# Patient Record
Sex: Male | Born: 1960 | Race: White | Hispanic: No | Marital: Married | Smoking: Current every day smoker
Health system: Southern US, Community
[De-identification: ages and names within clinical notes are randomized; demographics above are authoritative.]

## PROBLEM LIST (undated history)

## (undated) DIAGNOSIS — E785 Hyperlipidemia, unspecified: Secondary | ICD-10-CM

## (undated) DIAGNOSIS — M1990 Unspecified osteoarthritis, unspecified site: Secondary | ICD-10-CM

## (undated) DIAGNOSIS — I639 Cerebral infarction, unspecified: Secondary | ICD-10-CM

## (undated) DIAGNOSIS — I739 Peripheral vascular disease, unspecified: Secondary | ICD-10-CM

## (undated) HISTORY — DX: Peripheral vascular disease, unspecified: I73.9

## (undated) HISTORY — DX: Unspecified osteoarthritis, unspecified site: M19.90

## (undated) HISTORY — DX: Cerebral infarction, unspecified: I63.9

## (undated) HISTORY — DX: Hyperlipidemia, unspecified: E78.5

---

## 2011-09-28 ENCOUNTER — Emergency Department (HOSPITAL_COMMUNITY)
Admission: EM | Admit: 2011-09-28 | Discharge: 2011-09-28 | Disposition: A | Discharge status: Home / Self Care | Payer: Self-pay | Attending: Emergency Medicine | Admitting: Emergency Medicine

## 2011-09-28 ENCOUNTER — Emergency Department (HOSPITAL_COMMUNITY): Payer: Self-pay

## 2011-09-28 ENCOUNTER — Encounter: Payer: Self-pay | Admitting: Emergency Medicine

## 2011-09-28 DIAGNOSIS — M542 Cervicalgia: Secondary | ICD-10-CM | POA: Insufficient documentation

## 2011-09-28 DIAGNOSIS — T07XXXA Unspecified multiple injuries, initial encounter: Secondary | ICD-10-CM

## 2011-09-28 DIAGNOSIS — R404 Transient alteration of awareness: Secondary | ICD-10-CM | POA: Insufficient documentation

## 2011-09-28 DIAGNOSIS — S0990XA Unspecified injury of head, initial encounter: Secondary | ICD-10-CM | POA: Insufficient documentation

## 2011-09-28 DIAGNOSIS — Z7289 Other problems related to lifestyle: Secondary | ICD-10-CM

## 2011-09-28 DIAGNOSIS — T148XXA Other injury of unspecified body region, initial encounter: Secondary | ICD-10-CM | POA: Insufficient documentation

## 2011-09-28 DIAGNOSIS — F1010 Alcohol abuse, uncomplicated: Secondary | ICD-10-CM | POA: Insufficient documentation

## 2011-09-28 DIAGNOSIS — F17200 Nicotine dependence, unspecified, uncomplicated: Secondary | ICD-10-CM | POA: Insufficient documentation

## 2011-09-28 DIAGNOSIS — R51 Headache: Secondary | ICD-10-CM | POA: Insufficient documentation

## 2011-09-28 LAB — ETHANOL: Alcohol, Ethyl (B): 306 mg/dL — ABNORMAL HIGH (ref 0–11)

## 2011-09-28 LAB — POCT I-STAT, CHEM 8
BUN: 9 mg/dL (ref 6–23)
Chloride: 107 mEq/L (ref 96–112)
Sodium: 144 mEq/L (ref 135–145)
TCO2: 23 mmol/L (ref 0–100)

## 2011-09-28 MED ORDER — ACETAMINOPHEN 325 MG PO TABS: 650.0000 mg | ORAL_TABLET | Freq: Once | ORAL | Status: DC

## 2011-09-28 MED ORDER — ACETAMINOPHEN 325 MG PO TABS
ORAL_TABLET | ORAL | Status: AC
  Administered 2011-09-28: 650 mg
  Filled 2011-09-28: qty 2
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
## 2011-09-28 NOTE — ED Notes (Signed)
MD at bedside. 

## 2011-09-28 NOTE — ED Notes (Signed)
PT was ambulated and successfully walked w/o difficulties. States his heart was beating a little fast but stopped half-way through the process

## 2011-09-28 NOTE — ED Notes (Signed)
Patient cleared off the backboard per dr.Beaton order, ccollar in place

## 2011-09-28 NOTE — ED Provider Notes (Addendum)
7:24 AM I received care of the patient from Dr. Radford Pax after he has been medically evaluated and found to be intoxicated with an ethanol level of 308. At the time of sign out as instructed to await sobriety for the patient and at that time he would be able to be discharged home. I will reassess the patient in a matter of hours to evaluate his sobriety been. At this time he is sleeping comfortably in bed in no apparent distress, easily arousable to voice but intoxicated.  Felisa Bonier, MD 09/28/11 [phone]  12:14 PM The patient ambulated steadily and independently in the hallway of the emergency department demonstrating a steady gait. She is this time awake, alert, and oriented appropriately to person, place, time, and event, and is clinically sober and will be discharged home.  Felisa Bonier, MD 09/28/11 [phone]

## 2013-02-02 ENCOUNTER — Emergency Department (HOSPITAL_BASED_OUTPATIENT_CLINIC_OR_DEPARTMENT_OTHER)
Admission: EM | Admit: 2013-02-02 | Discharge: 2013-02-02 | Disposition: A | Discharge status: Home / Self Care | Payer: BC Managed Care – PPO | Attending: Emergency Medicine | Admitting: Emergency Medicine

## 2013-02-02 ENCOUNTER — Encounter (HOSPITAL_BASED_OUTPATIENT_CLINIC_OR_DEPARTMENT_OTHER): Payer: Self-pay | Admitting: *Deleted

## 2013-02-02 DIAGNOSIS — F17200 Nicotine dependence, unspecified, uncomplicated: Secondary | ICD-10-CM | POA: Insufficient documentation

## 2013-02-02 DIAGNOSIS — Y9389 Activity, other specified: Secondary | ICD-10-CM | POA: Insufficient documentation

## 2013-02-02 DIAGNOSIS — W540XXA Bitten by dog, initial encounter: Secondary | ICD-10-CM | POA: Insufficient documentation

## 2013-02-02 DIAGNOSIS — Z23 Encounter for immunization: Secondary | ICD-10-CM | POA: Insufficient documentation

## 2013-02-02 DIAGNOSIS — Y92009 Unspecified place in unspecified non-institutional (private) residence as the place of occurrence of the external cause: Secondary | ICD-10-CM | POA: Insufficient documentation

## 2013-02-02 DIAGNOSIS — S61452A Open bite of left hand, initial encounter: Secondary | ICD-10-CM

## 2013-02-02 DIAGNOSIS — S61209A Unspecified open wound of unspecified finger without damage to nail, initial encounter: Secondary | ICD-10-CM | POA: Insufficient documentation

## 2013-02-02 MED ORDER — OXYCODONE-ACETAMINOPHEN 5-325 MG PO TABS
2.0000 | ORAL_TABLET | Freq: Once | ORAL | Status: AC
  Administered 2013-02-02: 2 via ORAL
  Filled 2013-02-02 (×2): qty 2

## 2013-02-02 MED ORDER — TETANUS-DIPHTH-ACELL PERTUSSIS 5-2.5-18.5 LF-MCG/0.5 IM SUSP
0.5000 mL | Freq: Once | INTRAMUSCULAR | Status: AC
  Administered 2013-02-02: 0.5 mL via INTRAMUSCULAR
  Filled 2013-02-02: qty 0.5

## 2013-02-02 MED ORDER — CLINDAMYCIN HCL 300 MG PO CAPS: 300.0000 mg | ORAL_CAPSULE | Freq: Four times a day (QID) | ORAL | Status: DC

## 2013-02-02 MED ORDER — CLINDAMYCIN HCL 150 MG PO CAPS
300.0000 mg | ORAL_CAPSULE | Freq: Once | ORAL | Status: AC
  Administered 2013-02-02: 300 mg via ORAL
  Filled 2013-02-02: qty 2

## 2013-02-02 MED ORDER — OXYCODONE-ACETAMINOPHEN 5-325 MG PO TABS: 2.0000 | ORAL_TABLET | ORAL | Status: DC | PRN
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
## 2013-02-02 NOTE — ED Notes (Signed)
Pt c/o dog bite to left hand x 1 hr ago

## 2013-02-02 NOTE — ED Provider Notes (Signed)
Medical screening examination/treatment/procedure(s) were performed by non-physician practitioner and as supervising physician I was immediately available for consultation/collaboration.  Ethelda Chick, MD 02/02/13 2140

## 2016-12-01 ENCOUNTER — Emergency Department (HOSPITAL_COMMUNITY): Payer: BLUE CROSS/BLUE SHIELD

## 2016-12-01 ENCOUNTER — Inpatient Hospital Stay (HOSPITAL_COMMUNITY)
Admission: EM | Admit: 2016-12-01 | Discharge: 2016-12-04 | DRG: 062 | Disposition: A | Discharge status: Home / Self Care | Payer: BLUE CROSS/BLUE SHIELD | Attending: Neurology | Admitting: Neurology

## 2016-12-01 ENCOUNTER — Encounter (HOSPITAL_COMMUNITY): Payer: Self-pay | Admitting: Emergency Medicine

## 2016-12-01 DIAGNOSIS — H5500 Unspecified nystagmus: Secondary | ICD-10-CM | POA: Diagnosis present

## 2016-12-01 DIAGNOSIS — F17210 Nicotine dependence, cigarettes, uncomplicated: Secondary | ICD-10-CM | POA: Diagnosis present

## 2016-12-01 DIAGNOSIS — F1490 Cocaine use, unspecified, uncomplicated: Secondary | ICD-10-CM | POA: Diagnosis present

## 2016-12-01 DIAGNOSIS — F17200 Nicotine dependence, unspecified, uncomplicated: Secondary | ICD-10-CM | POA: Diagnosis present

## 2016-12-01 DIAGNOSIS — Q318 Other congenital malformations of larynx: Secondary | ICD-10-CM

## 2016-12-01 DIAGNOSIS — I63211 Cerebral infarction due to unspecified occlusion or stenosis of right vertebral arteries: Secondary | ICD-10-CM | POA: Diagnosis present

## 2016-12-01 DIAGNOSIS — R1310 Dysphagia, unspecified: Secondary | ICD-10-CM | POA: Diagnosis present

## 2016-12-01 DIAGNOSIS — Z23 Encounter for immunization: Secondary | ICD-10-CM | POA: Diagnosis not present

## 2016-12-01 DIAGNOSIS — E785 Hyperlipidemia, unspecified: Secondary | ICD-10-CM | POA: Diagnosis present

## 2016-12-01 DIAGNOSIS — I6350 Cerebral infarction due to unspecified occlusion or stenosis of unspecified cerebral artery: Secondary | ICD-10-CM

## 2016-12-01 DIAGNOSIS — R531 Weakness: Secondary | ICD-10-CM

## 2016-12-01 DIAGNOSIS — F1410 Cocaine abuse, uncomplicated: Secondary | ICD-10-CM | POA: Diagnosis not present

## 2016-12-01 DIAGNOSIS — R29810 Facial weakness: Secondary | ICD-10-CM | POA: Diagnosis present

## 2016-12-01 DIAGNOSIS — G8194 Hemiplegia, unspecified affecting left nondominant side: Secondary | ICD-10-CM | POA: Diagnosis present

## 2016-12-01 DIAGNOSIS — F1010 Alcohol abuse, uncomplicated: Secondary | ICD-10-CM | POA: Diagnosis not present

## 2016-12-01 DIAGNOSIS — I639 Cerebral infarction, unspecified: Secondary | ICD-10-CM

## 2016-12-01 DIAGNOSIS — I6602 Occlusion and stenosis of left middle cerebral artery: Secondary | ICD-10-CM | POA: Diagnosis present

## 2016-12-01 DIAGNOSIS — I6501 Occlusion and stenosis of right vertebral artery: Secondary | ICD-10-CM | POA: Diagnosis present

## 2016-12-01 DIAGNOSIS — R29704 NIHSS score 4: Secondary | ICD-10-CM | POA: Diagnosis present

## 2016-12-01 DIAGNOSIS — H538 Other visual disturbances: Secondary | ICD-10-CM

## 2016-12-01 DIAGNOSIS — M7989 Other specified soft tissue disorders: Secondary | ICD-10-CM | POA: Diagnosis not present

## 2016-12-01 LAB — I-STAT TROPONIN, ED: TROPONIN I, POC: 0 ng/mL (ref 0.00–0.08)

## 2016-12-01 LAB — COMPREHENSIVE METABOLIC PANEL
ALT: 27 U/L (ref 17–63)
ANION GAP: 10 (ref 5–15)
AST: 24 U/L (ref 15–41)
Albumin: 3.8 g/dL (ref 3.5–5.0)
Alkaline Phosphatase: 84 U/L (ref 38–126)
BILIRUBIN TOTAL: 0.7 mg/dL (ref 0.3–1.2)
BUN: 8 mg/dL (ref 6–20)
CO2: 26 mmol/L (ref 22–32)
Calcium: 9.5 mg/dL (ref 8.9–10.3)
Chloride: 101 mmol/L (ref 101–111)
Creatinine, Ser: 0.83 mg/dL (ref 0.61–1.24)
GFR calc Af Amer: >60 mL/min (ref >60)
Glucose, Bld: 109 mg/dL — ABNORMAL HIGH (ref 65–99)
POTASSIUM: 4.3 mmol/L (ref 3.5–5.1)
Sodium: 137 mmol/L (ref 135–145)
TOTAL PROTEIN: 7.1 g/dL (ref 6.5–8.1)

## 2016-12-01 LAB — DIFFERENTIAL
BASOS PCT: 0 %
Basophils Absolute: 0 10*3/uL (ref 0.0–0.1)
EOS PCT: 1 %
Eosinophils Absolute: 0.1 10*3/uL (ref 0.0–0.7)
Lymphocytes Relative: 21 %
Lymphs Abs: 3 10*3/uL (ref 0.7–4.0)
MONO ABS: 1.5 10*3/uL — AB (ref 0.1–1.0)
Monocytes Relative: 10 %
NEUTROS ABS: 9.9 10*3/uL — AB (ref 1.7–7.7)
Neutrophils Relative %: 68 %

## 2016-12-01 LAB — CBC
HEMATOCRIT: 45.9 % (ref 39.0–52.0)
Hemoglobin: 15.6 g/dL (ref 13.0–17.0)
MCH: 31.4 pg (ref 26.0–34.0)
MCHC: 34 g/dL (ref 30.0–36.0)
MCV: 92.4 fL (ref 78.0–100.0)
Platelets: 259 10*3/uL (ref 150–400)
RBC: 4.97 MIL/uL (ref 4.22–5.81)
RDW: 12.9 % (ref 11.5–15.5)
WBC: 14.5 10*3/uL — ABNORMAL HIGH (ref 4.0–10.5)

## 2016-12-01 LAB — APTT: APTT: 36 s (ref 24–36)

## 2016-12-01 LAB — I-STAT CHEM 8, ED
BUN: 11 mg/dL (ref 6–20)
CALCIUM ION: 1.07 mmol/L — AB (ref 1.15–1.40)
Chloride: 100 mmol/L — ABNORMAL LOW (ref 101–111)
Creatinine, Ser: 0.8 mg/dL (ref 0.61–1.24)
GLUCOSE: 110 mg/dL — AB (ref 65–99)
HCT: 48 % (ref 39.0–52.0)
Hemoglobin: 16.3 g/dL (ref 13.0–17.0)
POTASSIUM: 4.1 mmol/L (ref 3.5–5.1)
Sodium: 139 mmol/L (ref 135–145)
TCO2: 28 mmol/L (ref 0–100)

## 2016-12-01 LAB — CBG MONITORING, ED: GLUCOSE-CAPILLARY: 114 mg/dL — AB (ref 65–99)

## 2016-12-01 LAB — PROTIME-INR
INR: 0.98
Prothrombin Time: 13 seconds (ref 11.4–15.2)

## 2016-12-01 LAB — ETHANOL

## 2016-12-01 MED ORDER — STROKE: EARLY STAGES OF RECOVERY BOOK
Freq: Once | Status: AC
  Administered 2016-12-02: 04:00:00
  Filled 2016-12-01 (×2): qty 1

## 2016-12-01 MED ORDER — IOPAMIDOL (ISOVUE-370) INJECTION 76%
INTRAVENOUS | Status: AC
  Administered 2016-12-01: 50 mL via INTRAVENOUS
  Filled 2016-12-01: qty 50

## 2016-12-01 MED ORDER — ACETAMINOPHEN 650 MG RE SUPP: 650.0000 mg | RECTAL | Status: DC | PRN

## 2016-12-01 MED ORDER — SENNOSIDES-DOCUSATE SODIUM 8.6-50 MG PO TABS
1.0000 | ORAL_TABLET | Freq: Every evening | ORAL | Status: DC | PRN
  Filled 2016-12-01: qty 1

## 2016-12-01 MED ORDER — ALTEPLASE (STROKE) FULL DOSE INFUSION
0.9000 mg/kg | Freq: Once | INTRAVENOUS | Status: AC
  Administered 2016-12-01: 69 mg via INTRAVENOUS
  Filled 2016-12-01: qty 100

## 2016-12-01 MED ORDER — ACETAMINOPHEN 325 MG PO TABS
650.0000 mg | ORAL_TABLET | ORAL | Status: DC | PRN
  Administered 2016-12-02 – 2016-12-03 (×2): 650 mg via ORAL
  Filled 2016-12-01 (×2): qty 2

## 2016-12-01 MED ORDER — PANTOPRAZOLE SODIUM 40 MG IV SOLR
40.0000 mg | Freq: Every day | INTRAVENOUS | Status: DC
  Administered 2016-12-02: 40 mg via INTRAVENOUS
  Filled 2016-12-01: qty 40

## 2016-12-01 MED ORDER — LABETALOL HCL 5 MG/ML IV SOLN: 20.0000 mg | Freq: Once | INTRAVENOUS | Status: DC

## 2016-12-01 MED ORDER — SODIUM CHLORIDE 0.9 % IV SOLN
INTRAVENOUS | Status: DC
  Administered 2016-12-02 – 2016-12-03 (×3): via INTRAVENOUS

## 2016-12-01 MED ORDER — ACETAMINOPHEN 160 MG/5ML PO SOLN: 650.0000 mg | ORAL | Status: DC | PRN

## 2016-12-01 MED ORDER — CLEVIDIPINE BUTYRATE 0.5 MG/ML IV EMUL
0.0000 mg/h | INTRAVENOUS | Status: DC
  Filled 2016-12-01: qty 50

## 2016-12-01 NOTE — Consult Note (Addendum)
Admission H&P    Chief Complaint: Acute onset of headache with vertigo, left arm numbness/weakness and right facial droop  HPI: Hector Booker is an 56 y.o. male who presents with acute onset of crossed neurological deficits. Symptoms began with headache at 8:30 PM. 20 minutes later, he noticed trouble swallowing, dysarthria, left arm paresthesias with sensory numbness and left arm heaviness. Initially thought to be a possible MI, but while in ambulance he vomited and then developed diplopia with leftward deviation of left eye relative to right, in conjunction with nystagmus. Code Stroke was called.   BP 172/95, CBG 114. The patient rates his headache as 8/10. Describes vertigo as combined spinning and floating with sensation of disequilibrium. He has no prior history of vertigo. Denies history of ischemic stroke, ICH or MI.   LSN: 8:50 PM tPA Given: Yes NIHSS: 4  No past medical history on file.  No past surgical history on file.  No family history on file. Social History:  reports that he has been smoking Cigarettes.  He has been smoking about 1.00 pack per day. He does not have any smokeless tobacco history on file. He reports that he drinks about 7.2 oz of alcohol per week . He reports that he does not use drugs.  Allergies:  Allergies  Allergen Reactions  . Penicillins Itching    (Not in a hospital admission)  ROS: As per HPI.   Physical Examination: Weight 76.2 kg (167 lb 15.9 oz).  HEENT-  Normocephalic/atraumati. Neck - supple  Lungs - Respirations unlabored. No gross wheezing.  Extremities - No edema.   Neurologic Examination: Ment: Intact to complex questions and commands. No receptive or expressive aphasia. Fully oriented.  CN: PERRL. No visual field cut. Eyes drift slowly to left conjugately; no nystagmus on leftward gaze. Right fast beating nystagmus conjugately when eyes at midline and worsens with gaze to right. Nystagmus becomes oscillatory with upgaze.  Decreased temp and fine touch sensation right face. Subtle right facial droop. Hearing intact to conversation. No dysphonia. XI - symmetric. Tongue protrudes midline.  Motor: 5/5 all 4 extremities with no drift.  Sensory: Decreased FT sensation left arm. Temperature intact all 4 limbs.  Reflexes: Normoactive x 4 without asymmetry. Toes equivocal.  Cerebellar: Ataxia with left FNF. Normal on right.  Gait: Deferred due to falls risk concerns.   Results for orders placed or performed during the hospital encounter of 12/01/16 (from the past 48 hour(s))  CBG monitoring, ED     Status: Abnormal   Collection Time: 12/01/16 11:04 PM  Result Value Ref Range   Glucose-Capillary 114 (H) 65 - 99 mg/dL  I-Stat Chem 8, ED  [facility])     Status: Abnormal   Collection Time: 12/01/16 11:14 PM  Result Value Ref Range   Sodium 139 135 - 145 mmol/L   Potassium 4.1 3.5 - 5.1 mmol/L   Chloride 100 (L) 101 - 111 mmol/L   BUN 11 6 - 20 mg/dL   Creatinine, Ser 1.610.80 0.61 - 1.24 mg/dL   Glucose, Bld 096110 (H) 65 - 99 mg/dL   Calcium, Ion 0.451.07 (L) 1.15 - 1.40 mmol/L   TCO2 28 0 - 100 mmol/L   Hemoglobin 16.3 13.0 - 17.0 g/dL   HCT 40.948.0 81.139.0 - 91.452.0 %   No results found.  Assessment: 56 y.o. male with acute onset of headache with vertigo, left arm numbness/weakness/ataxia and right facial droop. 1. CT negative for hemorrhage or subacute hypodensity.  2. 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  3. After comprehensive review of possible contraindications, the patient has no absolute contraindications to tPA administration. 4. The patient is a tPA candidate. Discussed extensively the risks/benefits of tPA treatment vs. no treatment with the patient, including risks of hemorrhage and death with tPA administration versus worse overall outcomes on average in patients within tPA time window who are not administered tPA. Overall benefits of tPA regarding long-term prognosis are  felt to outweigh risks. The patient expressed understanding and wish to proceed with tPA.   Plan: 1. Following tPA infusion, admit to MICU.  2. BP management per post-tPA order set.  3. No antiplatelet medications or anticoagulants. Can consider starting if repeat CT head at 24 hours post-tPA is negative for hemorrhagic conversion. 4. Catheter based angiography in AM. May be a candidate for thrombectomy. Discussed with Dr. Titus Dubin and patient.  5. MRI of the brain without contrast 6. TTE.  7. PT consult, OT consult, Speech consult 8. Risk factor modification 9. Telemetry monitoring and frequent neuro checks.  10. HgbA1c, fasting lipid panel 11. 8. DVT prophylaxis with SCDs.   I spent 45 minutes of neurocritical care time in the evaluation and management of this patient.  Electronically signed: Dr. Caryl Pina 12/01/2016, 11:22 PM

## 2016-12-01 NOTE — ED Provider Notes (Signed)
[facility]-EMERGENCY DEPT Provider Note   CSN: [id number] Arrival date & time: 12/01/16  2258  By signing my name below, I, Arianna Nassar, attest that this documentation has been prepared under the direction and in the presence of Shon Baton, MD. Electronically Signed: Octavia Heir, ED Scribe. 12/01/16. 11:55 PM.   History   Chief Complaint Chief Complaint  Patient presents with  . Code Stroke   LEVEL V CAVEAT: HPI and ROS limited due to acuity of condition   The history is provided by the EMS personnel. The history is limited by the condition of the patient. No language interpreter was used.   HPI Comments: Hector Booker is a 56 y.o. male brought in by ambulance, who presents to the Emergency Department as a code stroke. Last seen normal was 8:50 pm. Per EMS, pt was complaining of an acute onset headache ~ 8:30 this evening. Per wife, ~ 20 minutes later, pt was very diaphoretic,and began complaining of left arm numbness, tingling, and weakness. Wife says he has been complaining of left lower extremity weakness and tingling intermittently for the past wek.  EMS states while en route, pt began to have right sided facial droop and a left eye gaze.  EMS vitals: BP: 172/95, HR 69, NSR 97% 2L/min [place], CBG 113  History reviewed. No pertinent past medical history.  Patient Active Problem List   Diagnosis Date Noted  . 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 No pertinent surgical history.     Home Medications    Prior to Admission medications   Medication Sig Start Date End Date Taking? Authorizing Provider  clindamycin (CLEOCIN) 300 MG capsule Take 1 capsule (300 mg total) by mouth 4 (four) times daily. Patient not taking: Reported on 12/01/2016 02/02/13   Elson Areas, PA-C  oxyCODONE-acetaminophen (PERCOCET/ROXICET) 5-325 MG per tablet Take 2 tablets by mouth every 4 (four) hours as needed for pain. Patient not taking: Reported on 12/01/2016 02/02/13   Elson Areas, PA-C    Family History No family history on file.  Social History Social History  Substance Use Topics  . Smoking status: Current Every Day Smoker    Packs/day: 1.00    Types: Cigarettes  . Smokeless tobacco: Never Used  . Alcohol use 7.2 oz/week    12 Cans of beer per week     Allergies   Penicillins   Review of Systems Review of Systems  Neurological: Positive for facial asymmetry, weakness, numbness and headaches.  All other systems reviewed and are negative.  LEVEL V CAVEAT: HPI and ROS limited due to acuity of condition.  Physical Exam Updated Vital Signs BP 141/91   Pulse 76   Temp 97.5 F (36.4 C)   Resp 18   Wt 167 lb 15.9 oz (76.2 kg)   SpO2 98%   BMI 25.54 kg/m   Physical Exam  Constitutional: He is oriented to person, place, and time. He appears well-developed and well-nourished. No distress.  HENT:  Head: Normocephalic and atraumatic.  Mild right-sided facial droop  Eyes: Pupils are equal, round, and reactive to light.  Cardiovascular: Normal rate, regular rhythm and normal heart sounds.   No murmur heard. Pulmonary/Chest: Effort normal and breath sounds normal. No respiratory distress. He has no wheezes.  Abdominal: Soft. Bowel sounds are normal. There is no tenderness. There is no rebound.  Musculoskeletal: He exhibits no edema.  Neurological: He is alert and oriented to person, place, and time.  No facial droop  noted on secondary assessment, 5 out of 5 strength in all 4 extremities, improving dysmetria left finger-nose-finger, fluent speech  Skin: Skin is warm and dry.  Psychiatric: He has a normal mood and affect.  Nursing note and vitals reviewed.    ED Treatments / Results  COORDINATION OF CARE:  11:48 PM Discussed treatment plan with treatment team at bedside and they agreed to plan.  Labs (all labs ordered are listed, but only abnormal results are displayed) Labs Reviewed  CBC - Abnormal; Notable for the following:        Result Value   WBC 14.5 (*)    All other components within normal limits  DIFFERENTIAL - Abnormal; Notable for the following:    Neutro Abs 9.9 (*)    Monocytes Absolute 1.5 (*)    All other components within normal limits  COMPREHENSIVE METABOLIC PANEL - Abnormal; Notable for the following:    Glucose, Bld 109 (*)    All other components within normal limits  I-STAT CHEM 8, ED - Abnormal; Notable for the following:    Chloride 100 (*)    Glucose, Bld 110 (*)    Calcium, Ion 1.07 (*)    All other components within normal limits  CBG MONITORING, ED - Abnormal; Notable for the following:    Glucose-Capillary 114 (*)    All other components within normal limits  ETHANOL  PROTIME-INR  APTT  TROPONIN I  RAPID URINE DRUG SCREEN, HOSP PERFORMED  URINALYSIS, ROUTINE W REFLEX MICROSCOPIC  HEMOGLOBIN A1C  LIPID PANEL  URINALYSIS, COMPLETE (UACMP) WITH MICROSCOPIC  I-STAT TROPOININ, ED    EKG  EKG Interpretation  Date/Time:  Tuesday December 02 2016 00:15:12 EST Ventricular Rate:  55 PR Interval:    QRS Duration: 96 QT Interval:  455 QTC Calculation: 436 R Axis:   63 Text Interpretation:  Sinus rhythm Confirmed by Wilkie AyeHORTON  MD, Noely Kuhnle ([id number]) on 12/02/2016 12:48:04 AM       Radiology Ct Angio Head W Or Wo Contrast  Addendum Date: 12/02/2016   ADDENDUM REPORT: 12/02/2016 00:11 ADDENDUM: Acute findings discussed with and reconfirmed by Dr.ERIC LINDZEN on 12/01/2016 at 12:10 am. Electronically Signed   By: Awilda Metroourtnay  Bloomer M.D.   On: 12/02/2016 00:11   Result Date: 12/02/2016 CLINICAL DATA:  Headache, blurry vision and LEFT-sided weakness beginning at 2030 hours today. EXAM: CT ANGIOGRAPHY HEAD AND NECK TECHNIQUE: Multidetector CT imaging of the head and neck was performed using the standard protocol during bolus administration of intravenous contrast. Multiplanar CT image reconstructions and MIPs were obtained to evaluate the vascular anatomy. Carotid stenosis measurements (when  applicable) are obtained utilizing NASCET criteria, using the distal internal carotid diameter as the denominator. CONTRAST:  50 cc Isovue 370 COMPARISON:  CT HEAD December 01, 2016 at 2307 hours FINDINGS: CTA NECK AORTIC ARCH: Normal appearance of the thoracic arch, normal branch pattern. Mild calcific atherosclerosis of aortic arch. The origins of the innominate, left Common carotid artery and subclavian artery are widely patent. RIGHT CAROTID SYSTEM: Common carotid artery is widely patent, coursing in a straight line fashion. Normal appearance of the carotid bifurcation without hemodynamically significant stenosis by NASCET criteria. Mild eccentric atherosclerosis internal carotid artery origin and RIGHT cervical internal carotid artery. Normal appearance of the included internal carotid artery. LEFT CAROTID SYSTEM: Common carotid artery is widely patent, coursing in a straight line fashion. Normal appearance of the carotid bifurcation without hemodynamically significant stenosis by NASCET criteria. Mild eccentric atherosclerosis. Normal appearance of the included internal carotid  artery. VERTEBRAL ARTERIES:LEFT vertebral artery is dominant. Mild luminal irregularity RIGHT vertebral artery without intimal flap or a focal filling defect. SKELETON: No acute osseous process though bone windows have not been submitted. Degenerative cervical spine resulting in moderate LEFT C2-3, moderate to severe RIGHT C3-4, bilateral severe C4-5, severe LEFT C5-6 and LEFT C6-7 neural foraminal narrowing. Scattered dental caries and molar periapical lucency/abscess. OTHER NECK: Soft tissues of the neck are non-acute though, not tailored for evaluation. Mild bronchitic changes in the included lung apices. Asymmetric fullness RIGHT focal cord. Asymmetrically atrophic LEFT parotid gland. CTA HEAD ANTERIOR CIRCULATION: Normal appearance of the cervical internal carotid arteries, petrous, cavernous and supra clinoid internal carotid  arteries. Widely patent anterior communicating artery. Normal appearance of the anterior and middle cerebral arteries. Mild luminal regularity. No large vessel occlusion, hemodynamically significant stenosis, dissection, contrast extravasation or aneurysm. POSTERIOR CIRCULATION: Moderate luminal irregularity RIGHT V4 segment, occluded RIGHT mid V4 segment. RIGHT posterior-inferior cerebellar artery is patent. LEFT vertebral artery is widely patent. Minimal retrograde flow into distal RIGHT V4 segment. Normal appearance the basilar artery and main branch vessels. Moderate luminal regularity LEFT vertebral artery and bilateral posterior cerebral arteries. No hemodynamically significant stenosis, contrast extravasation or aneurysm. VENOUS SINUSES: Major dural venous sinuses are patent though not tailored for evaluation on this angiographic examination. ANATOMIC VARIANTS: None. DELAYED PHASE: Not performed. IMPRESSION: CTA neck: Luminal irregularity RIGHT vertebral artery compatible with age indeterminate injury, without flow limiting stenosis. No hemodynamically significant stenosis of the carotid arteries. Asymmetric fullness RIGHT vocal cord, recommend direct inspection on nonemergent basis. Degenerative cervical spine resulting in multilevel severe neural foraminal narrowing. 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 Mild - moderate luminal irregularity of the intracranial vessels compatible with atherosclerosis. Dr. Otelia Limes, Neurology paged December 02, 2016 at 0001 hours, awaiting return call. Electronically Signed: By: Awilda Metro M.D. On: 12/02/2016 00:03   Ct Angio Neck W Or Wo Contrast  Addendum Date: 12/02/2016   ADDENDUM REPORT: 12/02/2016 00:11 ADDENDUM: Acute findings discussed with and reconfirmed by Dr.ERIC LINDZEN on 12/01/2016 at 12:10 am. Electronically Signed   By: Awilda Metro M.D.   On: 12/02/2016 00:11   Result Date: 12/02/2016 CLINICAL DATA:  Headache,  blurry vision and LEFT-sided weakness beginning at 2030 hours today. EXAM: CT ANGIOGRAPHY HEAD AND NECK TECHNIQUE: Multidetector CT imaging of the head and neck was performed using the standard protocol during bolus administration of intravenous contrast. Multiplanar CT image reconstructions and MIPs were obtained to evaluate the vascular anatomy. Carotid stenosis measurements (when applicable) are obtained utilizing NASCET criteria, using the distal internal carotid diameter as the denominator. CONTRAST:  50 cc Isovue 370 COMPARISON:  CT HEAD December 01, 2016 at 2307 hours FINDINGS: CTA NECK AORTIC ARCH: Normal appearance of the thoracic arch, normal branch pattern. Mild calcific atherosclerosis of aortic arch. The origins of the innominate, left Common carotid artery and subclavian artery are widely patent. RIGHT CAROTID SYSTEM: Common carotid artery is widely patent, coursing in a straight line fashion. Normal appearance of the carotid bifurcation without hemodynamically significant stenosis by NASCET criteria. Mild eccentric atherosclerosis internal carotid artery origin and RIGHT cervical internal carotid artery. Normal appearance of the included internal carotid artery. LEFT CAROTID SYSTEM: Common carotid artery is widely patent, coursing in a straight line fashion. Normal appearance of the carotid bifurcation without hemodynamically significant stenosis by NASCET criteria. Mild eccentric atherosclerosis. Normal appearance of the included internal carotid artery. VERTEBRAL ARTERIES:LEFT vertebral artery is dominant. Mild luminal irregularity RIGHT vertebral  artery without intimal flap or a focal filling defect. SKELETON: No acute osseous process though bone windows have not been submitted. Degenerative cervical spine resulting in moderate LEFT C2-3, moderate to severe RIGHT C3-4, bilateral severe C4-5, severe LEFT C5-6 and LEFT C6-7 neural foraminal narrowing. Scattered dental caries and molar periapical  lucency/abscess. OTHER NECK: Soft tissues of the neck are non-acute though, not tailored for evaluation. Mild bronchitic changes in the included lung apices. Asymmetric fullness RIGHT focal cord. Asymmetrically atrophic LEFT parotid gland. CTA HEAD ANTERIOR CIRCULATION: Normal appearance of the cervical internal carotid arteries, petrous, cavernous and supra clinoid internal carotid arteries. Widely patent anterior communicating artery. Normal appearance of the anterior and middle cerebral arteries. Mild luminal regularity. No large vessel occlusion, hemodynamically significant stenosis, dissection, contrast extravasation or aneurysm. POSTERIOR CIRCULATION: Moderate luminal irregularity RIGHT V4 segment, occluded RIGHT mid V4 segment. RIGHT posterior-inferior cerebellar artery is patent. LEFT vertebral artery is widely patent. Minimal retrograde flow into distal RIGHT V4 segment. Normal appearance the basilar artery and main branch vessels. Moderate luminal regularity LEFT vertebral artery and bilateral posterior cerebral arteries. No hemodynamically significant stenosis, contrast extravasation or aneurysm. VENOUS SINUSES: Major dural venous sinuses are patent though not tailored for evaluation on this angiographic examination. ANATOMIC VARIANTS: None. DELAYED PHASE: Not performed. IMPRESSION: CTA neck: Luminal irregularity RIGHT vertebral artery compatible with age indeterminate injury, without flow limiting stenosis. No hemodynamically significant stenosis of the carotid arteries. Asymmetric fullness RIGHT vocal cord, recommend direct inspection on nonemergent basis. Degenerative cervical spine resulting in multilevel severe neural foraminal narrowing. 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 Mild - moderate luminal irregularity of the intracranial vessels compatible with atherosclerosis. Dr. Otelia Limes, Neurology paged December 02, 2016 at 0001 hours, awaiting return call. Electronically  Signed: By: Awilda Metro M.D. On: 12/02/2016 00:03   Ct Head Code Stroke W/o Cm  Addendum Date: 12/01/2016   ADDENDUM REPORT: 12/01/2016 23:33 ADDENDUM: Acute findings discussed with and reconfirmed by [facility], Neurology on 12/01/2016 at 11:33 pm. Electronically Signed   By: Awilda Metro M.D.   On: 12/01/2016 23:33   Result Date: 12/01/2016 CLINICAL DATA:  Code stroke. Headache beginning at 2030 hours, LEFT-sided tingling. EXAM: CT HEAD WITHOUT CONTRAST TECHNIQUE: Contiguous axial images were obtained from the base of the skull through the vertex without intravenous contrast. COMPARISON:  CT HEAD September 28, 2011 FINDINGS: BRAIN: The ventricles and sulci are normal for age. No intraparenchymal hemorrhage, mass effect nor midline shift. Patchy supratentorial white matter hypodensities. No acute large vascular territory infarcts. No abnormal extra-axial fluid collections. Basal cisterns are patent. VASCULAR: Trace calcific atherosclerosis of the carotid siphons. SKULL: No skull fracture. No significant scalp soft tissue swelling. SINUSES/ORBITS: Minimal paranasal sinus mucosal thickening. Mastoid air cells are well aerated. The included ocular globes and orbital contents are non-suspicious. OTHER: None. ASPECTS [facility] Stroke Program Early CT Score) - Ganglionic level infarction (caudate, lentiform nuclei, internal capsule, insula, M1-M3 cortex): 7 - Supraganglionic infarction (M4-M6 cortex): 3 Total score (0-10 with 10 being normal): 10 IMPRESSION: 1. No acute intracranial process. Mild chronic small vessel ischemic disease. 2. ASPECTS is 10. Dr. Otelia Limes, Neurology paged on December 01, 2016 2320 hours, awaiting return call. Electronically Signed: By: Awilda Metro M.D. On: 12/01/2016 23:22    Procedures Procedures (including critical care time)  CRITICAL CARE Performed by: Shon Baton   Total critical care time: 25 minutes  Critical care time was exclusive of separately  billable procedures and treating other patients.  Critical  care was necessary to treat or prevent imminent or life-threatening deterioration.  Critical care was time spent personally by me on the following activities: development of treatment plan with patient and/or surrogate as well as nursing, discussions with consultants, evaluation of patient's response to treatment, examination of patient, obtaining history from patient or surrogate, ordering and performing treatments and interventions, ordering and review of laboratory studies, ordering and review of radiographic studies, pulse oximetry and re-evaluation of patient's condition.   Medications Ordered in ED Medications   stroke: mapping our early stages of recovery book (not administered)  0.9 %  sodium chloride infusion ( Intravenous New Bag/Given 12/02/16 0008)  acetaminophen (TYLENOL) tablet 650 mg (not administered)    Or  acetaminophen (TYLENOL) solution 650 mg (not administered)    Or  acetaminophen (TYLENOL) suppository 650 mg (not administered)  senna-docusate (Senokot-S) tablet 1 tablet (not administered)  pantoprazole (PROTONIX) injection 40 mg (not administered)  labetalol (NORMODYNE,TRANDATE) injection 20 mg (not administered)    And  clevidipine (CLEVIPREX) infusion 0.5 mg/mL (not administered)  iopamidol (ISOVUE-370) 76 % injection (50 mLs Intravenous Contrast Given 12/01/16 2324)  alteplase (ACTIVASE) 1 mg/mL infusion 69 mg (0 mg/kg  76.2 kg Intravenous Stopped 12/02/16 0030)     Initial Impression / Assessment and Plan / ED Course  I have reviewed the triage vital signs and the nursing notes.  Pertinent labs & imaging results that were available during my care of the patient were reviewed by me and considered in my medical decision making (see chart for details).     Patient presents with strokelike symptoms. Taken emergently to the CT scanner. On my initial assessment at the bridge, he did have a mild facial droop  and per neurology had significant ataxia. On my secondary assessment after TPA was given, patient had much improved finger-nose-finger on the left. 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 He will be taken to interventional radiology.  Final Clinical Impressions(s) / ED Diagnoses   Final diagnoses:  Cerebrovascular accident (CVA), unspecified mechanism (HCC)   I personally performed the services described in this documentation, which was scribed in my presence. The recorded information has been reviewed and is accurate.  New Prescriptions New Prescriptions   No medications on file     Shon Baton, MD 12/02/16 0106

## 2016-12-02 ENCOUNTER — Inpatient Hospital Stay (HOSPITAL_COMMUNITY): Payer: BLUE CROSS/BLUE SHIELD

## 2016-12-02 DIAGNOSIS — I6789 Other cerebrovascular disease: Secondary | ICD-10-CM

## 2016-12-02 DIAGNOSIS — E785 Hyperlipidemia, unspecified: Secondary | ICD-10-CM

## 2016-12-02 DIAGNOSIS — F1410 Cocaine abuse, uncomplicated: Secondary | ICD-10-CM

## 2016-12-02 DIAGNOSIS — I6501 Occlusion and stenosis of right vertebral artery: Secondary | ICD-10-CM

## 2016-12-02 DIAGNOSIS — I6350 Cerebral infarction due to unspecified occlusion or stenosis of unspecified cerebral artery: Secondary | ICD-10-CM

## 2016-12-02 DIAGNOSIS — F17200 Nicotine dependence, unspecified, uncomplicated: Secondary | ICD-10-CM | POA: Diagnosis present

## 2016-12-02 DIAGNOSIS — F17210 Nicotine dependence, cigarettes, uncomplicated: Secondary | ICD-10-CM

## 2016-12-02 HISTORY — PX: IR GENERIC HISTORICAL: IMG1180011

## 2016-12-02 HISTORY — DX: Cocaine abuse, uncomplicated: F14.10

## 2016-12-02 LAB — ECHOCARDIOGRAM COMPLETE: Weight: 2687.85 oz

## 2016-12-02 LAB — URINALYSIS, ROUTINE W REFLEX MICROSCOPIC
BILIRUBIN URINE: NEGATIVE
GLUCOSE, UA: NEGATIVE mg/dL
HGB URINE DIPSTICK: NEGATIVE
Ketones, ur: NEGATIVE mg/dL
Leukocytes, UA: NEGATIVE
NITRITE: NEGATIVE
PH: 6 (ref 5.0–8.0)
Protein, ur: NEGATIVE mg/dL
SPECIFIC GRAVITY, URINE: 1.033 — AB (ref 1.005–1.030)

## 2016-12-02 LAB — RAPID URINE DRUG SCREEN, HOSP PERFORMED
AMPHETAMINES: NOT DETECTED
BENZODIAZEPINES: NOT DETECTED
Barbiturates: NOT DETECTED
COCAINE: POSITIVE — AB
OPIATES: POSITIVE — AB
Tetrahydrocannabinol: NOT DETECTED

## 2016-12-02 LAB — LIPID PANEL
CHOLESTEROL: 181 mg/dL (ref 0–200)
HDL: 28 mg/dL — ABNORMAL LOW (ref >40)
LDL Cholesterol: 134 mg/dL — ABNORMAL HIGH (ref 0–99)
TRIGLYCERIDES: 94 mg/dL (ref <150)
Total CHOL/HDL Ratio: 6.5 RATIO
VLDL: 19 mg/dL (ref 0–40)

## 2016-12-02 LAB — TROPONIN I

## 2016-12-02 LAB — GLUCOSE, CAPILLARY: GLUCOSE-CAPILLARY: 154 mg/dL — AB (ref 65–99)

## 2016-12-02 LAB — MRSA PCR SCREENING: MRSA BY PCR: NEGATIVE

## 2016-12-02 MED ORDER — LIDOCAINE HCL (PF) 1 % IJ SOLN
INTRAMUSCULAR | Status: AC | PRN
  Administered 2016-12-02: 10 mL

## 2016-12-02 MED ORDER — ADULT MULTIVITAMIN W/MINERALS CH
1.0000 | ORAL_TABLET | Freq: Every day | ORAL | Status: DC
  Administered 2016-12-02 – 2016-12-04 (×3): 1 via ORAL
  Filled 2016-12-02 (×3): qty 1

## 2016-12-02 MED ORDER — HEPARIN SODIUM (PORCINE) 1000 UNIT/ML IJ SOLN
INTRAMUSCULAR | Status: AC
  Filled 2016-12-02: qty 1

## 2016-12-02 MED ORDER — ATORVASTATIN CALCIUM 40 MG PO TABS
40.0000 mg | ORAL_TABLET | Freq: Every day | ORAL | Status: DC
  Administered 2016-12-02 – 2016-12-03 (×2): 40 mg via ORAL
  Filled 2016-12-02 (×3): qty 1

## 2016-12-02 MED ORDER — IOPAMIDOL (ISOVUE-300) INJECTION 61%
INTRAVENOUS | Status: AC
  Administered 2016-12-02: 75 mL
  Filled 2016-12-02: qty 150

## 2016-12-02 MED ORDER — LORAZEPAM 1 MG PO TABS: 1.0000 mg | ORAL_TABLET | Freq: Four times a day (QID) | ORAL | Status: DC | PRN

## 2016-12-02 MED ORDER — FENTANYL CITRATE (PF) 100 MCG/2ML IJ SOLN
INTRAMUSCULAR | Status: AC
  Filled 2016-12-02: qty 2

## 2016-12-02 MED ORDER — VITAMIN B-1 100 MG PO TABS
100.0000 mg | ORAL_TABLET | Freq: Every day | ORAL | Status: DC
  Administered 2016-12-02 – 2016-12-04 (×3): 100 mg via ORAL
  Filled 2016-12-02 (×3): qty 1

## 2016-12-02 MED ORDER — HEPARIN SODIUM (PORCINE) 1000 UNIT/ML IJ SOLN
INTRAMUSCULAR | Status: AC | PRN
  Administered 2016-12-02: 1000 [IU] via INTRAVENOUS

## 2016-12-02 MED ORDER — MIDAZOLAM HCL 2 MG/2ML IJ SOLN
INTRAMUSCULAR | Status: AC
  Filled 2016-12-02: qty 2

## 2016-12-02 MED ORDER — MIDAZOLAM HCL 2 MG/2ML IJ SOLN
INTRAMUSCULAR | Status: AC | PRN
  Administered 2016-12-02: 1 mg via INTRAVENOUS

## 2016-12-02 MED ORDER — ONDANSETRON HCL 4 MG/2ML IJ SOLN: 4.0000 mg | Freq: Once | INTRAMUSCULAR | Status: AC

## 2016-12-02 MED ORDER — FENTANYL CITRATE (PF) 100 MCG/2ML IJ SOLN
INTRAMUSCULAR | Status: AC | PRN
  Administered 2016-12-02 (×2): 25 ug via INTRAVENOUS

## 2016-12-02 MED ORDER — FOLIC ACID 1 MG PO TABS
1.0000 mg | ORAL_TABLET | Freq: Every day | ORAL | Status: DC
  Administered 2016-12-02 – 2016-12-04 (×3): 1 mg via ORAL
  Filled 2016-12-02 (×3): qty 1

## 2016-12-02 MED ORDER — ONDANSETRON HCL 4 MG/2ML IJ SOLN
INTRAMUSCULAR | Status: AC
  Administered 2016-12-02: 4 mg
  Filled 2016-12-02: qty 2

## 2016-12-02 MED ORDER — LORAZEPAM 2 MG/ML IJ SOLN: 1.0000 mg | Freq: Four times a day (QID) | INTRAMUSCULAR | Status: DC | PRN

## 2016-12-02 MED ORDER — THIAMINE HCL 100 MG/ML IJ SOLN
100.0000 mg | Freq: Every day | INTRAMUSCULAR | Status: DC
  Filled 2016-12-02: qty 1

## 2016-12-02 MED ORDER — SODIUM CHLORIDE 0.9 % IV SOLN: INTRAVENOUS | Status: AC

## 2016-12-02 MED ORDER — LIDOCAINE HCL 1 % IJ SOLN
INTRAMUSCULAR | Status: AC
  Filled 2016-12-02: qty 20

## 2016-12-02 NOTE — Progress Notes (Addendum)
STROKE TEAM PROGRESS NOTE   HISTORY OF PRESENT ILLNESS (per record) Hector Booker is an 56 y.o. male who presents with acute onset of crossed neurological deficits. Symptoms began with headache at 8:30 PM. 20 minutes later, he noticed trouble swallowing, dysarthria, left arm paresthesias with sensory numbness and left arm heaviness. Initially thought to be a possible MI, but while in ambulance he vomited and then developed diplopia with leftward deviation of left eye relative to right, in conjunction with nystagmus. He was last known well at 8:50 PM on 12/01/2016.  Code Stroke was called. BP 172/95, CBG 114. The patient rates his headache as 8/10. Describes vertigo as combined spinning and floating with sensation of disequilibrium. He has no prior history of vertigo. Denies history of ischemic stroke, ICH or MI. NIHSS: 4. 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 Patient was administered IV t-PA 12/01/2016 at 2330. Plans are for cerebral angiogram in a.m. to consider for thrombectomy. Dr. Nigel Bridgeman discussed with Dr. Loni Beckwith and patient. He was admitted to the medical ICU for further evaluation and treatment.   SUBJECTIVE (INTERVAL HISTORY) Wife and dad are at bedside. Pt woke up from sleep, stated that he feels much better. No more dizziness and seeing things more clear now. No nausea or speech difficulty. He denies cocaine but UDS showed positive cocaine. He admitted smoking and heavy drinking every day at least 4 drinks.   OBJECTIVE Temp:  [97.4 F (36.3 C)-97.8 F (36.6 C)] 97.6 F (36.4 C) (01/30 0753) Pulse Rate:  [54-81] 56 (01/30 0630) Cardiac Rhythm: Normal sinus rhythm (01/30 0400) Resp:  [14-20] 15 (01/30 0630) BP: (109-179)/(56-97) 120/66 (01/30 0630) SpO2:  [94 %-100 %] 96 % (01/30 0630) Weight:  [76.2 kg (167 lb 15.9 oz)] 76.2 kg (167 lb 15.9 oz) (01/29 2300)  CBC:  Recent Labs Lab 12/01/16 2304 12/01/16 2314  WBC 14.5*  --   NEUTROABS 9.9*  --   HGB 15.6 16.3  HCT  45.9 48.0  MCV 92.4  --   PLT 259  --     Basic Metabolic Panel:  Recent Labs Lab 12/01/16 2304 12/01/16 2314  NA 137 139  K 4.3 4.1  CL 101 100*  CO2 26  --   GLUCOSE 109* 110*  BUN 8 11  CREATININE 0.83 0.80  CALCIUM 9.5  --     Lipid Panel:    Component Value Date/Time   CHOL 181 12/02/2016 0017   TRIG 94 12/02/2016 0017   HDL 28 (L) 12/02/2016 0017   CHOLHDL 6.5 12/02/2016 0017   VLDL 19 12/02/2016 0017   LDLCALC 134 (H) 12/02/2016 0017   HgbA1c: No results found for: HGBA1C Urine Drug Screen:    Component Value Date/Time   LABOPIA POSITIVE (A) 12/02/2016 0336   COCAINSCRNUR POSITIVE (A) 12/02/2016 0336   LABBENZ NONE DETECTED 12/02/2016 0336   AMPHETMU NONE DETECTED 12/02/2016 0336   THCU NONE DETECTED 12/02/2016 0336   LABBARB NONE DETECTED 12/02/2016 0336      IMAGING I have personally reviewed the radiological images below and agree with the radiology interpretations. Blue text is my interpretation  Ct Head Code Stroke W/o Cm 12/01/2016 1. No acute intracranial process. Mild chronic small vessel ischemic disease. 2. ASPECTS is 10.   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 Mild - moderate luminal irregularity of the intracranial vessels compatible with atherosclerosis.   CTA neck 12/02/2016 Luminal irregularity RIGHT vertebral artery compatible with age indeterminate injury, without flow limiting  stenosis. No hemodynamically significant stenosis of the carotid arteries. Asymmetric fullness RIGHT vocal cord, recommend direct inspection on nonemergent basis. Degenerative cervical spine resulting in multilevel severe neural foraminal narrowing.   Mr Brain Wo Contrast 12/02/2016 Punctate LEFT pontine infarct versus artifact. RIGHT V4 segment occlusion. Old small LEFT cerebellar infarct and mild chronic small vessel ischemic disease. Left posterior temporal punctate DWI changes.   TTE pending  LE venous doppler  pending   PHYSICAL EXAM  Temp:  [97.4 F (36.3 C)-97.8 F (36.6 C)] 97.6 F (36.4 C) (01/30 0753) Pulse Rate:  [54-81] 65 (01/30 1100) Resp:  [14-21] 21 (01/30 1100) BP: (109-179)/(56-97) 130/82 (01/30 1100) SpO2:  [94 %-100 %] 97 % (01/30 1100) Weight:  [167 lb 15.9 oz (76.2 kg)] 167 lb 15.9 oz (76.2 kg) (01/29 2300)  General - Well nourished, well developed, in no apparent distress.  Ophthalmologic - Fundi not visualized due to noncooperation.  Cardiovascular - Regular rate and rhythm.  Mental Status -  Level of arousal and orientation to time, place, and person were intact. Language including expression, naming, repetition, comprehension was assessed and found intact. Fund of Knowledge was assessed and was intact.  Cranial Nerves II - XII - II - Visual field intact OU. III, IV, VI - Extraocular movements intact. V - Facial sensation intact bilaterally. VII - Facial movement intact bilaterally. VIII - Hearing & vestibular intact bilaterally, unsustained rotational nystagmus on upward gaze. X - Palate elevates symmetrically. XI - Chin turning & shoulder shrug intact bilaterally. XII - Tongue protrusion intact.  Motor Strength - The patient's strength was normal in all extremities and pronator drift was absent.  Bulk was normal and fasciculations were absent.   Motor Tone - Muscle tone was assessed at the neck and appendages and was normal.  Reflexes - The patient's reflexes were 1+ in all extremities and he had no pathological reflexes.  Sensory - Light touch, temperature/pinprick were assessed and were symmetrical.    Coordination - The patient had normal movements in the hands and feet with no ataxia or dysmetria.  Tremor was absent.  Gait and Station - deferred.   ASSESSMENT/PLAN [name] is a 56 y.o. male with history of cigarette smoking cigarettes presenting with headache with vertigo, left arm numbness and weakness and right facial droop. He received  IV t-PA 12/01/2016 at 2330.   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 Risk factor including heavy smoker, alcohol use and cocaine use  Resultant  Unsustained rotational nystagmus on upward gaze  Code stroke CT No acute stroke. Aspects 10  CTA head emergent right VA occlusion.  CTA neck unremarkable  MRI  punctate left pontine infarct. Right V4 occlusion. Old small left cerebellar infarct. Questionable left posterior temporal punctate infarct  Cerebral angiogram penidng   CT head at 24h post tPA pending  2D Echo  pending  LDL 134  HgbA1c pending  SCDs for VTE prophylaxis  Diet NPO time specified  No antithrombotic prior to admission, now on No antithrombotic as within 24h of tPA administration. Plan DAPT once 24h imaging negative for hemorrhage.  Ongoing aggressive stroke risk factor management  Therapy recommendations:  pending   Disposition:  pending   Cocaine use  Pt denies   UDS showed positive cocaine  Tobacco abuse  Current smoker  Smoking cessation counseling provided  Pt is willing to quit  Alcohol use  Alcohol level elevated on admission  Pt admitted 4  beers at least daily  On CIWA protocol  Blood Pressure  Stable  No hx hypertension  Long-term BP goal normotensive  Hyperlipidemia  Home meds:  No statin  LDL 134, goal < 70  Add statin with lipitor 40  Continue statin at discharge  Other Stroke Risk Factors    Other Active Problems  Asymmetric fullness right vocal cord, needs outpatient follow-up.  Not seeing doctors for years  Hospital day # 1  This patient is critically ill due to brainstem stroke s/p tPA, right VA occlusion, cocaine use and at significant risk of neurological worsening, death form recurrent stroke, stroke extension, BA occlusion, heart failure brain death. This patient's care requires constant monitoring of vital signs, hemodynamics, respiratory and cardiac  monitoring, review of multiple databases, neurological assessment, discussion with family, other specialists and medical decision making of high complexity. I spent 40 minutes of neurocritical care time in the care of this patient.   Marvel Plan, MD PhD Stroke Neurology 12/02/2016 12:10 PM    To contact Stroke Continuity provider, please refer to [email or web address]. After hours, contact General Neurology  patient  she is a pretty

## 2016-12-02 NOTE — Progress Notes (Signed)
PT Cancellation Note  Patient Details Name: Hector Booker MRN: [id number] DOB: 11/11/1960   Cancelled Treatment:    Reason Eval/Treat Not Completed: Patient not medically ready. Pt received tPA and is on strict bed rest. PT to return as able when appropriate.   [name] [name] 12/02/2016, 7:14 AM   Lewis ShockAshly Ramona Ruark, PT, DPT Pager #: [phone] Office #: [phone]

## 2016-12-02 NOTE — Sedation Documentation (Signed)
IR tech will place exoseal 

## 2016-12-02 NOTE — Sedation Documentation (Signed)
Patient is resting comfortably. 

## 2016-12-02 NOTE — Procedures (Signed)
S/P 4 vessel cerebral arteriogram. RT CFA approach. Findings. 1Near complete occlusion of RT VBJ just distal  to RT PICA. 2.50 to 70 % stenosis of LT MCA distal M1 seg

## 2016-12-02 NOTE — Sedation Documentation (Signed)
IR holding pressure to R groin

## 2016-12-02 NOTE — Progress Notes (Signed)
OT Cancellation Note  Patient Details Name: Hector Booker MRN: [id number] DOB: 11/12/1960   Cancelled Treatment:    Reason Eval/Treat Not Completed: Medical issues which prohibited therapy (bedrest orders set s/p TPA)  Harolyn RutherfordJones, Mckenzee Beem B  [phone] 12/02/2016, 7:12 AM

## 2016-12-02 NOTE — ED Notes (Signed)
Verbal consent for tPA given by patient after Dr. Otelia LimesLindzen explained risks and benefits of medication.

## 2016-12-02 NOTE — ED Triage Notes (Signed)
Per [facility]: Pt to ED from home as a Code Stroke - pt c/o sudden onset R sided headache about 2030, 10-15 minutes later, pt c/o L leg and L arm tingling as well as L arm pain, L eye deviation, R facial droop, slurred speech, and diaphoresis. Pt vomited one time en route to ED, c/o "inability to swallow" - given 4mg  zofran, no N/V since. Pt A&O x 4 entire time. EMS VS: 172/95, HR 69 NSR, 97% 2L O2 [place], CBG 113. Pt denies being on blood thinners. 2 IVs 20g L hand and 20g R hand by EMS. Nystagmus noted on arrival to ED, slurred speech had resolved prior to arrival.

## 2016-12-02 NOTE — ED Notes (Signed)
Patient receiving TPA at this time.  Will call MRI once completed.

## 2016-12-02 NOTE — Sedation Documentation (Signed)
Pressure hold complete- bedrest 3 hrs from 1325

## 2016-12-02 NOTE — Consult Note (Signed)
Chief Complaint: Patient was seen in consultation today for cerebral arteriogram Chief Complaint  Patient presents with  . Code Stroke   at the request of Dr Agnes Lawrence  Referring Physician(s): Dr Agnes Lawrence  Supervising Physician: Julieanne Cotton  Patient Status: [facility] - In-pt  History of Present Illness: Hector Booker is a 56 y.o. male   Sudden onset left sided weakness/numbness Headache Rt sided facial droop Called EMS; vomited in ambulance Developed diplopia with left deviation; nystagmus Code stroke called  CTA: IMPRESSION: CTA neck: Luminal irregularity RIGHT vertebral artery compatible with age indeterminate injury, without flow limiting stenosis. No hemodynamically significant stenosis of the carotid arteries. Asymmetric fullness RIGHT vocal cord, recommend direct inspection on nonemergent basis. Degenerative cervical spine resulting in multilevel severe neural foraminal narrowing. 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 Mild - moderate luminal irregularity of the intracranial vessels compatible with atherosclerosis.  Was appropriate candidate for tPA Administered 12:15 am  Now request for cerebral arteriogram per Dr Otelia Limes Dr Corliss Skains has approved procedure--possible consideration for thrombectomy if would be needed.  Pt with right side facial minimal eye droop Left sided weakness resolving Pt reports he has had left sided foot and low leg numbness and tingling for weeks.    History reviewed. No pertinent past medical history.  History reviewed. No pertinent surgical history.  Allergies: Penicillins  Medications: Prior to Admission medications   Medication Sig Start Date End Date Taking? Authorizing Provider  clindamycin (CLEOCIN) 300 MG capsule Take 1 capsule (300 mg total) by mouth 4 (four) times daily. Patient not taking: Reported on 12/01/2016 02/02/13   Elson Areas, PA-C  oxyCODONE-acetaminophen  (PERCOCET/ROXICET) 5-325 MG per tablet Take 2 tablets by mouth every 4 (four) hours as needed for pain. Patient not taking: Reported on 12/01/2016 02/02/13   Elson Areas, PA-C     No family history on file.  Social History   Social History  . Marital status: Married    Spouse name: N/A  . Number of children: N/A  . Years of education: N/A   Social History Main Topics  . Smoking status: Current Every Day Smoker    Packs/day: 1.00    Types: Cigarettes  . Smokeless tobacco: Never Used  . Alcohol use 7.2 oz/week    12 Cans of beer per week  . Drug use: No  . Sexual activity: No   Other Topics Concern  . None   Social History Narrative  . None    Review of Systems: A 12 point ROS discussed and pertinent positives are indicated in the HPI above.  All other systems are negative.  Review of Systems  Constitutional: Positive for activity change. Negative for appetite change, fatigue and fever.  HENT: Negative for hearing loss, tinnitus and trouble swallowing.   Eyes: Negative for visual disturbance.  Respiratory: Negative for cough and shortness of breath.   Cardiovascular: Negative for chest pain.  Gastrointestinal: Negative for abdominal pain.  Musculoskeletal: Negative for back pain and gait problem.  Neurological: Positive for facial asymmetry, weakness, light-headedness, numbness and headaches. Negative for dizziness, tremors, seizures, syncope and speech difficulty.  Psychiatric/Behavioral: Negative for behavioral problems and confusion.    Vital Signs: BP 120/66   Pulse (!) 56   Temp 97.6 F (36.4 C) (Oral)   Resp 15   Wt 167 lb 15.9 oz (76.2 kg)   SpO2 96%   BMI 25.54 kg/m   Physical Exam  Constitutional: He is oriented to  person, place, and time. He appears well-nourished.  Tongue midline  HENT:  Head: Atraumatic.  Eyes: EOM are normal.  minimal rt sided eye droop  Neck: Neck supple.  Cardiovascular: Normal rate, regular rhythm and normal heart sounds.    Pulmonary/Chest: Effort normal and breath sounds normal.  Abdominal: Soft. Bowel sounds are normal.  Musculoskeletal: Normal range of motion. He exhibits no edema.  Slight weaker grip on Left  Neurological: He is alert and oriented to person, place, and time.  Skin: Skin is warm and dry.  Psychiatric: He has a normal mood and affect. His behavior is normal. Judgment and thought content normal.    Mallampati Score:  MD Evaluation Airway: WNL Heart: WNL Abdomen: WNL Chest/ Lungs: WNL ASA  Classification: 3 Mallampati/Airway Score: One  Imaging: Ct Angio Head W Or Wo Contrast  Addendum Date: 12/02/2016   ADDENDUM REPORT: 12/02/2016 00:11 ADDENDUM: Acute findings discussed with and reconfirmed by Dr.ERIC LINDZEN on 12/01/2016 at 12:10 am. Electronically Signed   By: Awilda Metro M.D.   On: 12/02/2016 00:11   Result Date: 12/02/2016 CLINICAL DATA:  Headache, blurry vision and LEFT-sided weakness beginning at 2030 hours today. EXAM: CT ANGIOGRAPHY HEAD AND NECK TECHNIQUE: Multidetector CT imaging of the head and neck was performed using the standard protocol during bolus administration of intravenous contrast. Multiplanar CT image reconstructions and MIPs were obtained to evaluate the vascular anatomy. Carotid stenosis measurements (when applicable) are obtained utilizing NASCET criteria, using the distal internal carotid diameter as the denominator. CONTRAST:  50 cc Isovue 370 COMPARISON:  CT HEAD December 01, 2016 at 2307 hours FINDINGS: CTA NECK AORTIC ARCH: Normal appearance of the thoracic arch, normal branch pattern. Mild calcific atherosclerosis of aortic arch. The origins of the innominate, left Common carotid artery and subclavian artery are widely patent. RIGHT CAROTID SYSTEM: Common carotid artery is widely patent, coursing in a straight line fashion. Normal appearance of the carotid bifurcation without hemodynamically significant stenosis by NASCET criteria. Mild eccentric  atherosclerosis internal carotid artery origin and RIGHT cervical internal carotid artery. Normal appearance of the included internal carotid artery. LEFT CAROTID SYSTEM: Common carotid artery is widely patent, coursing in a straight line fashion. Normal appearance of the carotid bifurcation without hemodynamically significant stenosis by NASCET criteria. Mild eccentric atherosclerosis. Normal appearance of the included internal carotid artery. VERTEBRAL ARTERIES:LEFT vertebral artery is dominant. Mild luminal irregularity RIGHT vertebral artery without intimal flap or a focal filling defect. SKELETON: No acute osseous process though bone windows have not been submitted. Degenerative cervical spine resulting in moderate LEFT C2-3, moderate to severe RIGHT C3-4, bilateral severe C4-5, severe LEFT C5-6 and LEFT C6-7 neural foraminal narrowing. Scattered dental caries and molar periapical lucency/abscess. OTHER NECK: Soft tissues of the neck are non-acute though, not tailored for evaluation. Mild bronchitic changes in the included lung apices. Asymmetric fullness RIGHT focal cord. Asymmetrically atrophic LEFT parotid gland. CTA HEAD ANTERIOR CIRCULATION: Normal appearance of the cervical internal carotid arteries, petrous, cavernous and supra clinoid internal carotid arteries. Widely patent anterior communicating artery. Normal appearance of the anterior and middle cerebral arteries. Mild luminal regularity. No large vessel occlusion, hemodynamically significant stenosis, dissection, contrast extravasation or aneurysm. POSTERIOR CIRCULATION: Moderate luminal irregularity RIGHT V4 segment, occluded RIGHT mid V4 segment. RIGHT posterior-inferior cerebellar artery is patent. LEFT vertebral artery is widely patent. Minimal retrograde flow into distal RIGHT V4 segment. Normal appearance the basilar artery and main branch vessels. Moderate luminal regularity LEFT vertebral artery and bilateral posterior cerebral arteries. No  hemodynamically significant stenosis, contrast extravasation or aneurysm. VENOUS SINUSES: Major dural venous sinuses are patent though not tailored for evaluation on this angiographic examination. ANATOMIC VARIANTS: None. DELAYED PHASE: Not performed. IMPRESSION: CTA neck: Luminal irregularity RIGHT vertebral artery compatible with age indeterminate injury, without flow limiting stenosis. No hemodynamically significant stenosis of the carotid arteries. Asymmetric fullness RIGHT vocal cord, recommend direct inspection on nonemergent basis. Degenerative cervical spine resulting in multilevel severe neural foraminal narrowing. 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 Mild - moderate luminal irregularity of the intracranial vessels compatible with atherosclerosis. Dr. Otelia Limes, Neurology paged December 02, 2016 at 0001 hours, awaiting return call. Electronically Signed: By: Awilda Metro M.D. On: 12/02/2016 00:03   Ct Angio Neck W Or Wo Contrast  Addendum Date: 12/02/2016   ADDENDUM REPORT: 12/02/2016 00:11 ADDENDUM: Acute findings discussed with and reconfirmed by Dr.ERIC LINDZEN on 12/01/2016 at 12:10 am. Electronically Signed   By: Awilda Metro M.D.   On: 12/02/2016 00:11   Result Date: 12/02/2016 CLINICAL DATA:  Headache, blurry vision and LEFT-sided weakness beginning at 2030 hours today. EXAM: CT ANGIOGRAPHY HEAD AND NECK TECHNIQUE: Multidetector CT imaging of the head and neck was performed using the standard protocol during bolus administration of intravenous contrast. Multiplanar CT image reconstructions and MIPs were obtained to evaluate the vascular anatomy. Carotid stenosis measurements (when applicable) are obtained utilizing NASCET criteria, using the distal internal carotid diameter as the denominator. CONTRAST:  50 cc Isovue 370 COMPARISON:  CT HEAD December 01, 2016 at 2307 hours FINDINGS: CTA NECK AORTIC ARCH: Normal appearance of the thoracic arch, normal branch  pattern. Mild calcific atherosclerosis of aortic arch. The origins of the innominate, left Common carotid artery and subclavian artery are widely patent. RIGHT CAROTID SYSTEM: Common carotid artery is widely patent, coursing in a straight line fashion. Normal appearance of the carotid bifurcation without hemodynamically significant stenosis by NASCET criteria. Mild eccentric atherosclerosis internal carotid artery origin and RIGHT cervical internal carotid artery. Normal appearance of the included internal carotid artery. LEFT CAROTID SYSTEM: Common carotid artery is widely patent, coursing in a straight line fashion. Normal appearance of the carotid bifurcation without hemodynamically significant stenosis by NASCET criteria. Mild eccentric atherosclerosis. Normal appearance of the included internal carotid artery. VERTEBRAL ARTERIES:LEFT vertebral artery is dominant. Mild luminal irregularity RIGHT vertebral artery without intimal flap or a focal filling defect. SKELETON: No acute osseous process though bone windows have not been submitted. Degenerative cervical spine resulting in moderate LEFT C2-3, moderate to severe RIGHT C3-4, bilateral severe C4-5, severe LEFT C5-6 and LEFT C6-7 neural foraminal narrowing. Scattered dental caries and molar periapical lucency/abscess. OTHER NECK: Soft tissues of the neck are non-acute though, not tailored for evaluation. Mild bronchitic changes in the included lung apices. Asymmetric fullness RIGHT focal cord. Asymmetrically atrophic LEFT parotid gland. CTA HEAD ANTERIOR CIRCULATION: Normal appearance of the cervical internal carotid arteries, petrous, cavernous and supra clinoid internal carotid arteries. Widely patent anterior communicating artery. Normal appearance of the anterior and middle cerebral arteries. Mild luminal regularity. No large vessel occlusion, hemodynamically significant stenosis, dissection, contrast extravasation or aneurysm. POSTERIOR CIRCULATION:  Moderate luminal irregularity RIGHT V4 segment, occluded RIGHT mid V4 segment. RIGHT posterior-inferior cerebellar artery is patent. LEFT vertebral artery is widely patent. Minimal retrograde flow into distal RIGHT V4 segment. Normal appearance the basilar artery and main branch vessels. Moderate luminal regularity LEFT vertebral artery and bilateral posterior cerebral arteries. No hemodynamically significant stenosis, contrast extravasation or aneurysm. VENOUS SINUSES: Major dural venous  sinuses are patent though not tailored for evaluation on this angiographic examination. ANATOMIC VARIANTS: None. DELAYED PHASE: Not performed. IMPRESSION: CTA neck: Luminal irregularity RIGHT vertebral artery compatible with age indeterminate injury, without flow limiting stenosis. No hemodynamically significant stenosis of the carotid arteries. Asymmetric fullness RIGHT vocal cord, recommend direct inspection on nonemergent basis. Degenerative cervical spine resulting in multilevel severe neural foraminal narrowing. 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 Mild - moderate luminal irregularity of the intracranial vessels compatible with atherosclerosis. Dr. Otelia Limes, Neurology paged December 02, 2016 at 0001 hours, awaiting return call. Electronically Signed: By: Awilda Metro M.D. On: 12/02/2016 00:03   Mr Brain Wo Contrast  Result Date: 12/02/2016 CLINICAL DATA:  Headache, blurry vision and LEFT-sided weakness beginning at 2030 hours today. Known RIGHT vertebral artery occlusion. EXAM: MRI HEAD WITHOUT CONTRAST TECHNIQUE: Multiplanar, multiecho pulse sequences of the brain and surrounding structures were obtained without intravenous contrast. COMPARISON:  None. FINDINGS: BRAIN: Punctate reduced diffusion LEFT posterior pons versus artifact, not corroborated on the coronal DWI. No susceptibility artifact to suggest hemorrhage. Scattered subcentimeter supratentorial white matter FLAIR T2  hyperintensities. The ventricles and sulci are normal for patient's age. Old small LEFT cerebellar infarct. No suspicious parenchymal signal, masses or mass effect. No abnormal extra-axial fluid collections. VASCULAR: Tenuous distal RIGHT vertebral artery flow void with susceptibility artifact consistent with today's findings of occlusion. SKULL AND UPPER CERVICAL SPINE: No abnormal sellar expansion. No suspicious calvarial bone marrow signal. Craniocervical junction maintained. SINUSES/ORBITS: The mastoid air-cells and included paranasal sinuses are well-aerated. The included ocular globes and orbital contents are non-suspicious. OTHER: None. IMPRESSION: Punctate LEFT pontine infarct versus artifact. RIGHT V4 segment occlusion. Old small LEFT cerebellar infarct and mild chronic small vessel ischemic disease. Electronically Signed   By: Awilda Metro M.D.   On: 12/02/2016 03:10   Ct Head Code Stroke W/o Cm  Addendum Date: 12/01/2016   ADDENDUM REPORT: 12/01/2016 23:33 ADDENDUM: Acute findings discussed with and reconfirmed by [facility], Neurology on 12/01/2016 at 11:33 pm. Electronically Signed   By: Awilda Metro M.D.   On: 12/01/2016 23:33   Result Date: 12/01/2016 CLINICAL DATA:  Code stroke. Headache beginning at 2030 hours, LEFT-sided tingling. EXAM: CT HEAD WITHOUT CONTRAST TECHNIQUE: Contiguous axial images were obtained from the base of the skull through the vertex without intravenous contrast. COMPARISON:  CT HEAD September 28, 2011 FINDINGS: BRAIN: The ventricles and sulci are normal for age. No intraparenchymal hemorrhage, mass effect nor midline shift. Patchy supratentorial white matter hypodensities. No acute large vascular territory infarcts. No abnormal extra-axial fluid collections. Basal cisterns are patent. VASCULAR: Trace calcific atherosclerosis of the carotid siphons. SKULL: No skull fracture. No significant scalp soft tissue swelling. SINUSES/ORBITS: Minimal paranasal sinus mucosal  thickening. Mastoid air cells are well aerated. The included ocular globes and orbital contents are non-suspicious. OTHER: None. ASPECTS [facility] Stroke Program Early CT Score) - Ganglionic level infarction (caudate, lentiform nuclei, internal capsule, insula, M1-M3 cortex): 7 - Supraganglionic infarction (M4-M6 cortex): 3 Total score (0-10 with 10 being normal): 10 IMPRESSION: 1. No acute intracranial process. Mild chronic small vessel ischemic disease. 2. ASPECTS is 10. Dr. Otelia Limes, Neurology paged on December 01, 2016 2320 hours, awaiting return call. Electronically Signed: By: Awilda Metro M.D. On: 12/01/2016 23:22    Labs:  CBC:  Recent Labs  12/01/16 2304 12/01/16 2314  WBC 14.5*  --   HGB 15.6 16.3  HCT 45.9 48.0  PLT 259  --     COAGS:  Recent Labs  12/01/16 2304  INR 0.98  APTT 36    BMP:  Recent Labs  12/01/16 2304 12/01/16 2314  NA 137 139  K 4.3 4.1  CL 101 100*  CO2 26  --   GLUCOSE 109* 110*  BUN 8 11  CALCIUM 9.5  --   CREATININE 0.83 0.80  GFRNONAA >60  --   GFRAA >60  --     LIVER FUNCTION TESTS:  Recent Labs  12/01/16 2304  BILITOT 0.7  AST 24  ALT 27  ALKPHOS 84  PROT 7.1  ALBUMIN 3.8    TUMOR MARKERS: No results for input(s): AFPTM, CEA, CA199, CHROMGRNA in the last 8760 hours.  Assessment and Plan:  CVA Right vertebral artery occlusion Minimal left side weakness; rt eye droop tPA 1215 am today Now scheduled for cerebral arteriogram in IR Risks and Benefits discussed with the patient including, but not limited to bleeding, infection, vascular injury, contrast induced renal failure, stroke or even death. All of the patient's questions were answered, patient is agreeable to proceed. Consent signed and in chart.   Thank you for this interesting consult.  I greatly enjoyed meeting Delsa BernJames C Dalpe and look forward to participating in their care.  A copy of this report was sent to the requesting provider on this  date.  Electronically Signed: Marlyn Tondreau A 12/02/2016, 8:03 AM   I spent a total of 40 Minutes    in face to face in clinical consultation, greater than 50% of which was counseling/coordinating care for cerebral arteriogram

## 2016-12-02 NOTE — Care Management Note (Addendum)
Case Management Note  Patient Details  Name: Hector Booker MRN: [id number] Date of Birth: 08/01/1961  Subjective/Objective:   Admitted with stroke - received TPA                   Action/Plan:   PTA from home with wife. Per epic; Artistfinancial counselor following.   Current substance abuse - CSW consulted.  PT evaluation will be ordered when medically stable.  CM will continue to follow for discharge needs   Expected Discharge Date:                  Expected Discharge Plan:     In-House Referral:     Discharge planning Services  CM Consult  Post Acute Care Choice:    Choice offered to:     DME Arranged:    DME Agency:     HH Arranged:    HH Agency:     Status of Service:  In process, will continue to follow  If discussed at Long Length of Stay Meetings, dates discussed:    Additional Comments:  Cherylann ParrClaxton, Nicole Defino S, RN 12/02/2016, 3:02 PM

## 2016-12-02 NOTE — Sedation Documentation (Signed)
Family updated as to patient's status.

## 2016-12-02 NOTE — Progress Notes (Signed)
  Echocardiogram 2D Echocardiogram has been performed.  [name], Tony 12/02/2016, 1:06 PM

## 2016-12-03 ENCOUNTER — Inpatient Hospital Stay (HOSPITAL_COMMUNITY): Payer: BLUE CROSS/BLUE SHIELD

## 2016-12-03 ENCOUNTER — Other Ambulatory Visit (HOSPITAL_COMMUNITY): Payer: Self-pay

## 2016-12-03 ENCOUNTER — Encounter (HOSPITAL_COMMUNITY): Payer: Self-pay | Admitting: Interventional Radiology

## 2016-12-03 DIAGNOSIS — M7989 Other specified soft tissue disorders: Secondary | ICD-10-CM

## 2016-12-03 LAB — GLUCOSE, CAPILLARY: Glucose-Capillary: 136 mg/dL — ABNORMAL HIGH (ref 65–99)

## 2016-12-03 LAB — HEMOGLOBIN A1C
Hgb A1c MFr Bld: 4.9 % (ref 4.8–5.6)
MEAN PLASMA GLUCOSE: 94 mg/dL

## 2016-12-03 MED ORDER — INFLUENZA VAC SPLIT QUAD 0.5 ML IM SUSY
0.5000 mL | PREFILLED_SYRINGE | INTRAMUSCULAR | Status: AC
  Administered 2016-12-04: 0.5 mL via INTRAMUSCULAR
  Filled 2016-12-03: qty 0.5

## 2016-12-03 MED ORDER — CLOPIDOGREL BISULFATE 75 MG PO TABS
75.0000 mg | ORAL_TABLET | Freq: Every day | ORAL | Status: DC
  Administered 2016-12-03 – 2016-12-04 (×2): 75 mg via ORAL
  Filled 2016-12-03 (×2): qty 1

## 2016-12-03 MED ORDER — PNEUMOCOCCAL VAC POLYVALENT 25 MCG/0.5ML IJ INJ
0.5000 mL | INJECTION | INTRAMUSCULAR | Status: AC
  Administered 2016-12-04: 0.5 mL via INTRAMUSCULAR
  Filled 2016-12-03: qty 0.5

## 2016-12-03 MED ORDER — ASPIRIN EC 325 MG PO TBEC
325.0000 mg | DELAYED_RELEASE_TABLET | Freq: Every day | ORAL | Status: DC
  Administered 2016-12-03 – 2016-12-04 (×2): 325 mg via ORAL
  Filled 2016-12-03 (×3): qty 1

## 2016-12-03 MED FILL — Alteplase For Inj 100 MG: INTRAVENOUS | Qty: 31 | Status: AC

## 2016-12-03 NOTE — Evaluation (Signed)
Speech Language Pathology Evaluation Patient Details Name: Hector Booker MRN: [id number] DOB: 07/21/1961 Today's Date: 12/03/2016 Time: 1144- 1155    Problem List:  Patient Active Problem List   Diagnosis Date Noted  . 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 Hyperlipidemia 12/02/2016  . Cigarette smoker 12/02/2016  . Cocaine abuse 12/02/2016  . Left pontine stroke [facility](HCC) s/p tPA 12/01/2016   Past Medical History: History reviewed. No pertinent past medical history. Past Surgical History:  Past Surgical History:  Procedure Laterality Date  . IR GENERIC HISTORICAL  12/02/2016   IR ANGIO VERTEBRAL SEL VERTEBRAL BILAT MOD SED 12/02/2016 Julieanne CottonSanjeev Deveshwar, MD [facility]-INTERV RAD  . IR GENERIC HISTORICAL  12/02/2016   IR ANGIO INTRA EXTRACRAN SEL COM CAROTID INNOMINATE BILAT MOD SED 12/02/2016 Julieanne CottonSanjeev Deveshwar, MD [facility]-INTER[facility] RAD   HPI:  Mr.Hector C Hodgesis a 56 y.o.malewith history of cigarette smoking cigarettes presenting with headache with vertigo, left arm numbness and weakness and right facial droop. Dx with punctate L pontine infarct, old small left cerebellar infarct. Questionable left posterior temporal punctate infarct.   Assessment / Plan / Recommendation Clinical Impression  Cognitive-linguistic skills WFL for all tasks assessed. No further SLP needs.     SLP Assessment  Patient does not need any further Speech Lanaguage Pathology Services    Follow Up Recommendations  None          SLP Evaluation Cognition  Overall Cognitive Status: Within Functional Limits for tasks assessed       Comprehension  Auditory Comprehension Overall Auditory Comprehension: Appears within functional limits for tasks assessed Visual Recognition/Discrimination Discrimination: Within Function Limits Reading Comprehension Reading Status: Within funtional limits    Expression Expression Primary Mode of Expression: Verbal Verbal Expression Overall Verbal Expression: Appears within  functional limits for tasks assessed   Oral / Motor  Oral Motor/Sensory Function Overall Oral Motor/Sensory Function: Within functional limits Motor Speech Overall Motor Speech: Appears within functional limits for tasks assessed   GO                   Ferdinand LangoLeah Venezia Sargeant MA, CCC-SLP [phone]  Allyna Pittsley Meryl 12/03/2016, 12:03 PM

## 2016-12-03 NOTE — Progress Notes (Signed)
*  PRELIMINARY RESULTS* Vascular Ultrasound Bilateral lower extremity venous duplex has been completed.  Preliminary findings: No evidence of deep vein thrombosis or baker's cysts bilaterally.  Chauncey FischerCharlotte C Jamiaya Bina 12/03/2016, 3:46 PM

## 2016-12-03 NOTE — Progress Notes (Signed)
Bedside stroke swallow screen performed. Pt denies any difficulty swallowing prior to stroke. Lungs auscultated before and after sip of water. Lungs remain clear. Pt denies any difficulty swallowing cracker. No cough. Diet advanced per neurology. Will continue to monitor for difficulty swallowing.

## 2016-12-03 NOTE — Evaluation (Signed)
Physical Therapy Evaluation/Discharge Patient Details Name: Hector Booker MRN: [id number] DOB: 08/07/1961 Today's Date: 12/03/2016   History of Present Illness  Pt is a 56 y.o. male presenting with trouble swallowing, dysarthria, L arm numbness/weakness, R facial droop, vertigo, diplopia with leftward deviation of L eye in conjunction with nystagmus. MRI on 1/30 + for acute punctate L pontine infarct. Pt now s/p tPA.  Clinical Impression  Pt functioning at baseline. Pt at minimal fall risk as indicated by score of 24/24 on DGI. Pt with no further skilled acute PT needs at this time. PT SIGNING OFF. Please consult in future if needed.     Follow Up Recommendations No PT follow up    Equipment Recommendations  None recommended by PT    Recommendations for Other Services       Precautions / Restrictions Precautions Precautions: None Restrictions Weight Bearing Restrictions: No      Mobility  Bed Mobility Overal bed mobility: Independent Bed Mobility: Sit to Supine     Supine to sit: Supervision Sit to supine: Independent   General bed mobility comments: No difficulty returning to supine following ambulation  Transfers Overall transfer level: Independent Equipment used: None Transfers: Sit to/from Stand Sit to Stand: Independent         General transfer comment: no unsteadiness or LOB. Pt reports feeling normal  Ambulation/Gait Ambulation/Gait assistance: Independent Ambulation Distance (Feet): 170 Feet Assistive device: None Gait Pattern/deviations: WFL(Within Functional Limits) Gait velocity: normal Gait velocity interpretation: at or above normal speed for age/gender General Gait Details: no deviations noted  Stairs Stairs: Yes Stairs assistance: Independent Stair Management: Alternating pattern Number of Stairs: 10 General stair comments: no increase in time required for ascending or descending  Wheelchair Mobility    Modified Rankin (Stroke Patients  Only)       Balance Overall balance assessment: Independent                               Standardized Balance Assessment Standardized Balance Assessment : Dynamic Gait Index   Dynamic Gait Index Level Surface: Normal Change in Gait Speed: Normal Gait with Horizontal Head Turns: Normal Gait with Vertical Head Turns: Normal Gait and Pivot Turn: Normal Step Over Obstacle: Normal Step Around Obstacles: Normal Steps: Normal Total Score: 24       Pertinent Vitals/Pain Pain Assessment: No/denies pain    Home Living Family/patient expects to be discharged to:: Private residence Living Arrangements: Alone Available Help at Discharge: Family;Available PRN/intermittently Type of Home: Mobile home Home Access: Stairs to enter   Entrance Stairs-Number of Steps: 2 Home Layout: One level Home Equipment: Shower seat - built in      Prior Function Level of Independence: Independent         Comments: drives, works doing carepentry work     Higher education careers adviserHand Dominance   Dominant Hand: Left    Extremity/Trunk Assessment   Upper Extremity Assessment Upper Extremity Assessment: Overall WFL for tasks assessed    Lower Extremity Assessment Lower Extremity Assessment: Overall WFL for tasks assessed    Cervical / Trunk Assessment Cervical / Trunk Assessment: Normal  Communication   Communication: No difficulties  Cognition Arousal/Alertness: Awake/alert Behavior During Therapy: WFL for tasks assessed/performed Overall Cognitive Status: Within Functional Limits for tasks assessed                      General Comments General comments (skin integrity, edema, etc.): Pt  appears to have no vision deficits according to brief vision screen    Exercises     Assessment/Plan    PT Assessment Patent does not need any further PT services  PT Problem List            PT Treatment Interventions      PT Goals (Current goals can be found in the Care Plan section)   Acute Rehab PT Goals Patient Stated Goal: get back home and to work again PT Goal Formulation: All assessment and education complete, DC therapy    Frequency     Barriers to discharge        Co-evaluation               End of Session Equipment Utilized During Treatment: Gait belt Activity Tolerance: Patient tolerated treatment well Patient left: in bed;with call bell/phone within reach           Time: 1500-1514 PT Time Calculation (min) (ACUTE ONLY): 14 min   Charges:   PT Evaluation $PT Eval Low Complexity: 1 Procedure     PT G CodesLane Hacker 01/02/2017, 3:48 PM  Lane Hacker, SPT Acute Rehab SPT [phone]

## 2016-12-03 NOTE — Evaluation (Signed)
Occupational Therapy Evaluation and Discharge Patient Details Name: Hector Booker MRN: [id number] DOB: [birth year] Today's Date: 12/03/2016    History of Present Illness Pt is a 56 y.o. male presenting with trouble swallowing, dysarthria, L arm numbness/weakness, R facial droop, vertigo, diplopia with leftward deviation of L eye in conjunction with nystagmus. MRI on 1/30 + for acute punctate L pontine infarct. Pt now s/p tPA.   Clinical Impression   Pt reports he was independent with ADL PTA. Currently pt overall supervision for ADL and functional mobility; no c/o of dizziness and no unsteadiness/LOB noted. Pt reports all symptoms have resolved and he currently feels he has returned to his functional baseline. Educated pt on BE FAST; he verbalized understanding. Pt planning to d/c home with intermittent family supervision. No further acute OT needs identified; signing off at this time. Please re-consult if needs change. Thank you for this referral.    Follow Up Recommendations  No OT follow up;Supervision - Intermittent    Equipment Recommendations  None recommended by OT    Recommendations for Other Services PT consult     Precautions / Restrictions Precautions Precautions: Fall Restrictions Weight Bearing Restrictions: No      Mobility Bed Mobility Overal bed mobility: Needs Assistance Bed Mobility: Supine to Sit     Supine to sit: Supervision     General bed mobility comments: Supervision for safety. No dizziness reported. Increased time.  Transfers Overall transfer level: Needs assistance Equipment used: None Transfers: Sit to/from Stand Sit to Stand: Supervision         General transfer comment: Supervision for safety. No unsteadiness, LOB, or dizziness reported.    Balance Overall balance assessment: No apparent balance deficits (not formally assessed)                                          ADL Overall ADL's : Needs  assistance/impaired Eating/Feeding: Independent;Sitting   Grooming: Supervision/safety;Standing   Upper Body Bathing: Set up;Sitting   Lower Body Bathing: Supervison/ safety;Sit to/from stand   Upper Body Dressing : Set up;Sitting Upper Body Dressing Details (indicate cue type and reason): to don hospital gown Lower Body Dressing: Supervision/safety;Sit to/from stand Lower Body Dressing Details (indicate cue type and reason): Pt able to don underwear and socks. Toilet Transfer: Supervision/safety;Ambulation;Regular Teacher, adult education Details (indicate cue type and reason): Simulated by sit to stand from EOB with functional mobility in room.         Functional mobility during ADLs: Supervision/safety General ADL Comments: VSS throughout. Educated pt on signs/symptoms of CVA and need to return to hospital if experiencing signs/symptoms; pt verbalized understanding.     Vision Vision Assessment?: No apparent visual deficits Additional Comments: Appears WFL. Pt reports vision has returned to baseline   Perception     Praxis      Pertinent Vitals/Pain Pain Assessment: No/denies pain     Hand Dominance Left   Extremity/Trunk Assessment Upper Extremity Assessment Upper Extremity Assessment: Overall WFL for tasks assessed   Lower Extremity Assessment Lower Extremity Assessment: Defer to PT evaluation   Cervical / Trunk Assessment Cervical / Trunk Assessment: Normal   Communication Communication Communication: No difficulties   Cognition Arousal/Alertness: Awake/alert Behavior During Therapy: WFL for tasks assessed/performed Overall Cognitive Status: Within Functional Limits for tasks assessed  General Comments       Exercises       Shoulder Instructions      Home Living Family/patient expects to be discharged to:: Private residence Living Arrangements: Alone ("sometimes lives alone, sometimes lives with wife") Available Help at  Discharge: Family;Available PRN/intermittently Type of Home: Mobile home Home Access: Stairs to enter Entrance Stairs-Number of Steps: 2   Home Layout: One level     Bathroom Shower/Tub: Producer, television/film/videoWalk-in shower   Bathroom Toilet: Standard     Home Equipment: Shower seat - built in          Prior Functioning/Environment Level of Independence: Independent        Comments: drives, works doing odd jobs        OT Problem List:     OT Treatment/Interventions:      OT Goals(Current goals can be found in the care plan section) Acute Rehab OT Goals Patient Stated Goal: get back home and to work again [employer] Goal Formulation: All assessment and education complete, DC therapy  OT Frequency:     Barriers to D/C:            Co-evaluation              End of Session Equipment Utilized During Treatment: Gait belt Nurse Communication: Mobility status  Activity Tolerance: Patient tolerated treatment well Patient left: in chair;with call bell/phone within reach   Time: 1439-1455 OT Time Calculation (min): 16 min Charges:  OT General Charges $OT Visit: 1 Procedure OT Evaluation $OT Eval Moderate Complexity: 1 Procedure G-Codes:     Gaye AlkenBailey A Roselia Snipe M.S., OTR/L Pager: [phone]: [phone]  12/03/2016, 3:04 PM

## 2016-12-03 NOTE — Progress Notes (Signed)
STROKE TEAM PROGRESS NOTE   SUBJECTIVE (INTERVAL HISTORY) Wife is at bedside. Pt has no complains. CT repeat 24 h after tPA showed no bleeding. Started on DAPT. Cerebral angio yesterday confirmed right VBJ near occlusion. Pending PT/OT evaluation. Transfer to floor   OBJECTIVE Temp:  [97.7 F (36.5 C)-98.4 F (36.9 C)] 98 F (36.7 C) (01/31 0742) Pulse Rate:  [56-80] 61 (01/31 1000) Cardiac Rhythm: Normal sinus rhythm;Sinus bradycardia (01/31 0800) Resp:  [14-26] 20 (01/31 1000) BP: (102-155)/(59-87) 135/86 (01/31 1000) SpO2:  [94 %-100 %] 97 % (01/31 1000)  CBC:   Recent Labs Lab 12/01/16 2304 12/01/16 2314  WBC 14.5*  --   NEUTROABS 9.9*  --   HGB 15.6 16.3  HCT 45.9 48.0  MCV 92.4  --   PLT 259  --     Basic Metabolic Panel:   Recent Labs Lab 12/01/16 2304 12/01/16 2314  NA 137 139  K 4.3 4.1  CL 101 100*  CO2 26  --   GLUCOSE 109* 110*  BUN 8 11  CREATININE 0.83 0.80  CALCIUM 9.5  --     Lipid Panel:     Component Value Date/Time   CHOL 181 12/02/2016 0017   TRIG 94 12/02/2016 0017   HDL 28 (L) 12/02/2016 0017   CHOLHDL 6.5 12/02/2016 0017   VLDL 19 12/02/2016 0017   LDLCALC 134 (H) 12/02/2016 0017   HgbA1c:  Lab Results  Component Value Date   HGBA1C 4.9 12/01/2016   Urine Drug Screen:     Component Value Date/Time   LABOPIA POSITIVE (A) 12/02/2016 0336   COCAINSCRNUR POSITIVE (A) 12/02/2016 0336   LABBENZ NONE DETECTED 12/02/2016 0336   AMPHETMU NONE DETECTED 12/02/2016 0336   THCU NONE DETECTED 12/02/2016 0336   LABBARB NONE DETECTED 12/02/2016 0336      IMAGING I have personally reviewed the radiological images below and agree with the radiology interpretations. Blue text is my interpretation  Ct Head Code Stroke W/o Cm 12/01/2016 1. No acute intracranial process. Mild chronic small vessel ischemic disease. 2. ASPECTS is 10.   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 Mild - moderate luminal  irregularity of the intracranial vessels compatible with atherosclerosis.   CTA neck 12/02/2016 Luminal irregularity RIGHT vertebral artery compatible with age indeterminate injury, without flow limiting stenosis. No hemodynamically significant stenosis of the carotid arteries. Asymmetric fullness RIGHT vocal cord, recommend direct inspection on nonemergent basis. Degenerative cervical spine resulting in multilevel severe neural foraminal narrowing.   Mr Brain Wo Contrast 12/02/2016 Punctate LEFT pontine infarct versus artifact. RIGHT V4 segment occlusion. Old small LEFT cerebellar infarct and mild chronic small vessel ischemic disease. Left posterior temporal punctate DWI changes.   TTE Left ventricle: The cavity size was normal. Systolic function was   normal. The estimated ejection fraction was in the range of 60%   to 65%. Wall motion was normal; there were no regional wall   motion abnormalities. Left ventricular diastolic function   parameters were normal.  LE venous doppler pending  Cerebral angio - 1Near complete occlusion of RT VBJ just distal  to RT PICA. 2.50 to 70 % stenosis of LT MCA distal M1 seg   PHYSICAL EXAM  Temp:  [97.7 F (36.5 C)-98.4 F (36.9 C)] 98 F (36.7 C) (01/31 0742) Pulse Rate:  [56-80] 61 (01/31 1000) Resp:  [14-26] 20 (01/31 1000) BP: (102-155)/(59-87) 135/86 (01/31 1000) SpO2:  [94 %-100 %] 97 % (01/31 1000)  General -  Well nourished, well developed, in no apparent distress.  Ophthalmologic - Fundi not visualized due to noncooperation.  Cardiovascular - Regular rate and rhythm.  Mental Status -  Level of arousal and orientation to time, place, and person were intact. Language including expression, naming, repetition, comprehension was assessed and found intact. Fund of Knowledge was assessed and was intact.  Cranial Nerves II - XII - II - Visual field intact OU. III, IV, VI - Extraocular movements intact. V - Facial sensation intact  bilaterally. VII - Facial movement intact bilaterally. VIII - Hearing & vestibular intact bilaterally, no nystagmus. X - Palate elevates symmetrically. XI - Chin turning & shoulder shrug intact bilaterally. XII - Tongue protrusion intact.  Motor Strength - The patient's strength was normal in all extremities and pronator drift was absent.  Bulk was normal and fasciculations were absent.   Motor Tone - Muscle tone was assessed at the neck and appendages and was normal.  Reflexes - The patient's reflexes were 1+ in all extremities and he had no pathological reflexes.  Sensory - Light touch, temperature/pinprick were assessed and were symmetrical.    Coordination - The patient had normal movements in the hands and feet with no ataxia or dysmetria.  Tremor was absent.  Gait and Station - deferred.   ASSESSMENT/PLAN [name] is a 56 y.o. male with history of cigarette smoking cigarettes presenting with headache with vertigo, left arm numbness and weakness and right facial droop. He received IV t-PA 12/01/2016 at 2330.   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 Risk factor including heavy smoker, alcohol use and cocaine use. However, questionable left posterior temporal punctate infarct, will need 30 day cardiac event monitoring as outpt.  Resultant  Unsustained rotational nystagmus on upward gaze  Code stroke CT No acute stroke. Aspects 10  CTA head emergent right VA occlusion.  CTA neck unremarkable  MRI  punctate left pontine infarct. Right V4 occlusion. Old small left cerebellar infarct. Questionable left posterior temporal punctate infarct.  Cerebral angiogram confirmed right VBJ near occlusion   CT head at 24h post tPA no acute abnormalities  2D Echo EF 60-65%  LE venous doppler pending  Recommend 30 day cardiac event monitoring as outpt to rule out afib  LDL 134  HgbA1c 4.9  SCDs for VTE prophylaxis Diet Heart Room  service appropriate? Yes; Fluid consistency: Thin  No antithrombotic prior to admission, now on DAPT for 3 months and then plavix alone.  Ongoing aggressive stroke risk factor management  Therapy recommendations:  pending   Disposition:  pending   Cocaine use  Pt denies   UDS showed positive cocaine  Cessation counseling provided  Pt is willing to quit  Tobacco abuse  Current smoker  Smoking cessation counseling provided  Pt is willing to quit  Alcohol use  Alcohol level elevated on admission  Pt admitted 4 beers at least daily  On CIWA protocol  Blood Pressure  Stable  No hx hypertension Long-term BP goal normotensive  Hyperlipidemia  Home meds:  No statin  LDL 134, goal < 70  Add statin with lipitor 40  Continue statin at discharge  Other Stroke Risk Factors    Other Active Problems  Asymmetric fullness right vocal cord, needs outpatient follow-up.  Not seeing doctors for years  Transfer to floor  Hospital day # 2   Marvel Plan, MD PhD Stroke Neurology 12/03/2016 11:05 AM    To contact Stroke  Continuity provider, please refer to [email or web address]. After hours, contact General Neurology  patient  she is a pretty

## 2016-12-04 ENCOUNTER — Other Ambulatory Visit (HOSPITAL_COMMUNITY): Payer: Self-pay | Admitting: Interventional Radiology

## 2016-12-04 ENCOUNTER — Other Ambulatory Visit: Payer: Self-pay | Admitting: Cardiology

## 2016-12-04 ENCOUNTER — Encounter (HOSPITAL_COMMUNITY): Payer: Self-pay | Admitting: Nurse Practitioner

## 2016-12-04 DIAGNOSIS — F1010 Alcohol abuse, uncomplicated: Secondary | ICD-10-CM

## 2016-12-04 DIAGNOSIS — Q318 Other congenital malformations of larynx: Secondary | ICD-10-CM

## 2016-12-04 DIAGNOSIS — I639 Cerebral infarction, unspecified: Secondary | ICD-10-CM

## 2016-12-04 DIAGNOSIS — I771 Stricture of artery: Secondary | ICD-10-CM

## 2016-12-04 HISTORY — DX: Alcohol abuse, uncomplicated: F10.10

## 2016-12-04 MED ORDER — ASPIRIN 325 MG PO TBEC: 325.0000 mg | DELAYED_RELEASE_TABLET | Freq: Every day | ORAL | 0 refills | Status: DC

## 2016-12-04 MED ORDER — ATORVASTATIN CALCIUM 40 MG PO TABS: 40.0000 mg | ORAL_TABLET | Freq: Every day | ORAL | 2 refills | Status: DC

## 2016-12-04 MED ORDER — CLOPIDOGREL BISULFATE 75 MG PO TABS: 75.0000 mg | ORAL_TABLET | Freq: Every day | ORAL | 2 refills | Status: DC

## 2016-12-04 MED ORDER — ASPIRIN 325 MG PO TBEC: 325.0000 mg | DELAYED_RELEASE_TABLET | Freq: Every day | ORAL | 0 refills | Status: AC

## 2016-12-04 NOTE — Progress Notes (Signed)
Late entry for missed modified Rankin Score.  Score based on review of medical record as well as discussion with supervising PT Ashly Chadwell, PT.    12/03/16 1510  Modified Rankin (Stroke Patients Only)  Pre-Morbid Rankin Score 0  Modified Rankin 0  Cloud CreekMark Macrae Wiegman, [place] CarolinaPT  [phone] 12/04/2016

## 2016-12-04 NOTE — Progress Notes (Signed)
CSW engaged with Patient at her bedside to provide substance abuse resources. Patient denies using any illicit substances but does report that he smokes cigars which he is planning to quit. Patient and Patient's wife receptive to substance abuse resources. CSW explained the various programs including intensive outpatient, outpatient, and residential treatment programs. Patient denies any questions or concerns. CSW signing off. Please consult should new need(s) arise.    Enos FlingAshley Julienne Vogler, MSW, LCSW [facility] Clinical Social Worker [phone]

## 2016-12-04 NOTE — Care Management Note (Signed)
Case Management Note  Patient Details  Name: Hector Booker MRN: [id number] Date of Birth: 09/30/1961  Subjective/Objective:   Admitted with stroke - received TPA                   Action/Plan:   PTA from home with wife. Per epic; Artistfinancial counselor following.   Current substance abuse - CSW consulted.  PT evaluation will be ordered when medically stable.  CM will continue to follow for discharge needs   Expected Discharge Date:  12/04/16               Expected Discharge Plan:     In-House Referral:     Discharge planning Services  CM Consult  Post Acute Care Choice:    Choice offered to:     DME Arranged:    DME Agency:     HH Arranged:    HH Agency:     Status of Service:  In process, will continue to follow  If discussed at Long Length of Stay Meetings, dates discussed:    12/04/2016 Additional Comments: Pt discharge home today.  Pt alert and oriented prior to discharge - denied barriers to returning home with wife.  Pt has active insurance and denied hardship with obtaining prescribed medications.  No CM needs determined prior to discharge Cherylann ParrClaxton, Jaelle Campanile S, RN 12/04/2016, 11:27 AM

## 2016-12-04 NOTE — Progress Notes (Signed)
[facility] wife as [facility]. Education and care plans completed. Vitals stable .Pt given discharge summary and signed.

## 2016-12-04 NOTE — Discharge Summary (Signed)
Stroke Discharge Summary  Patient ID: omair dettmer   MRN: [id number]      DOB: 1961/09/20  Date of Admission: 12/01/2016 Date of Discharge: 12/04/2016  Attending Physician:  Marvel Plan, MD, Stroke MD Consulting Physician(s):    Raelyn Ensign, MD (Interventional Neuroradiologist)  Patient's PCP:  No primary care provider on file.  DISCHARGE DIAGNOSIS:  Principal Problem:   Left pontine stroke [facility]) s/p tPA Active Problems:   Vertebral artery occlusion, right   Hyperlipidemia   Cigarette smoker   Cocaine abuse   Alcohol abuse   Vocal cord anomaly  BMI: Body mass index is 25.54 kg/m.  History reviewed. No pertinent past medical history. Past Surgical History:  Procedure Laterality Date  . IR GENERIC HISTORICAL  12/02/2016   IR ANGIO VERTEBRAL SEL VERTEBRAL BILAT MOD SED 12/02/2016 Julieanne Cotton, MD [facility]  . IR GENERIC HISTORICAL  12/02/2016   IR ANGIO INTRA EXTRACRAN SEL COM CAROTID INNOMINATE BILAT MOD SED 12/02/2016 Julieanne Cotton, MD [facility]    Allergies as of 12/04/2016      Reactions   Penicillins Itching      Medication List    STOP taking these medications   clindamycin 300 MG capsule Commonly known as:  CLEOCIN   oxyCODONE-acetaminophen 5-325 MG tablet Commonly known as:  PERCOCET/ROXICET     TAKE these medications   aspirin 325 MG EC tablet Take 1 tablet (325 mg total) by mouth daily. STOP taking after 90 days. Start taking on:  12/05/2016   atorvastatin 40 MG tablet Commonly known as:  LIPITOR Take 1 tablet (40 mg total) by mouth daily at 6 PM.   clopidogrel 75 MG tablet Commonly known as:  PLAVIX Take 1 tablet (75 mg total) by mouth daily. Start taking on:  12/05/2016       LABORATORY STUDIES CBC    Component Value Date/Time   WBC 14.5 (H) 12/01/2016 2304   RBC 4.97 12/01/2016 2304   HGB 16.3 12/01/2016 2314   HCT 48.0 12/01/2016 2314   PLT 259 12/01/2016 2304   MCV 92.4 12/01/2016 2304   MCH 31.4 12/01/2016  2304   MCHC 34.0 12/01/2016 2304   RDW 12.9 12/01/2016 2304   LYMPHSABS 3.0 12/01/2016 2304   MONOABS 1.5 (H) 12/01/2016 2304   EOSABS 0.1 12/01/2016 2304   BASOSABS 0.0 12/01/2016 2304   CMP    Component Value Date/Time   NA 139 12/01/2016 2314   K 4.1 12/01/2016 2314   CL 100 (L) 12/01/2016 2314   CO2 26 12/01/2016 2304   GLUCOSE 110 (H) 12/01/2016 2314   BUN 11 12/01/2016 2314   CREATININE 0.80 12/01/2016 2314   CALCIUM 9.5 12/01/2016 2304   PROT 7.1 12/01/2016 2304   ALBUMIN 3.8 12/01/2016 2304   AST 24 12/01/2016 2304   ALT 27 12/01/2016 2304   ALKPHOS 84 12/01/2016 2304   BILITOT 0.7 12/01/2016 2304   GFRNONAA >60 12/01/2016 2304   GFRAA >60 12/01/2016 2304   COAGS Lab Results  Component Value Date   INR 0.98 12/01/2016   Lipid Panel    Component Value Date/Time   CHOL 181 12/02/2016 0017   TRIG 94 12/02/2016 0017   HDL 28 (L) 12/02/2016 0017   CHOLHDL 6.5 12/02/2016 0017   VLDL 19 12/02/2016 0017   LDLCALC 134 (H) 12/02/2016 0017   HgbA1C  Lab Results  Component Value Date   HGBA1C 4.9 12/01/2016   Cardiac Panel (last 3 results)  Recent Labs  12/02/16 0017  TROPONINI <0.03   Urinalysis    Component Value Date/Time   COLORURINE YELLOW 12/02/2016 0336   APPEARANCEUR CLEAR 12/02/2016 0336   LABSPEC 1.033 (H) 12/02/2016 0336   PHURINE 6.0 12/02/2016 0336   GLUCOSEU NEGATIVE 12/02/2016 0336   HGBUR NEGATIVE 12/02/2016 0336   BILIRUBINUR NEGATIVE 12/02/2016 0336   KETONESUR NEGATIVE 12/02/2016 0336   PROTEINUR NEGATIVE 12/02/2016 0336   NITRITE NEGATIVE 12/02/2016 0336   LEUKOCYTESUR NEGATIVE 12/02/2016 0336   Urine Drug Screen     Component Value Date/Time   LABOPIA POSITIVE (A) 12/02/2016 0336   COCAINSCRNUR POSITIVE (A) 12/02/2016 0336   LABBENZ NONE DETECTED 12/02/2016 0336   AMPHETMU NONE DETECTED 12/02/2016 0336   THCU NONE DETECTED 12/02/2016 0336   LABBARB NONE DETECTED 12/02/2016 0336    Alcohol Level    Component Value  Date/Time   ETH <5 12/01/2016 2304     SIGNIFICANT DIAGNOSTIC STUDIES I have personally reviewed the radiological images below and agree with the radiology interpretations.  Ct Head Code Stroke W/o Cm 12/01/2016 1. No acute intracranial process. Mild chronic small vessel ischemic disease. 2. ASPECTS is 10.   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 Mild - moderate luminal irregularity of the intracranial vessels compatible with atherosclerosis.   CTA neck 12/02/2016 Luminal irregularity RIGHT vertebral artery compatible with age indeterminate injury, without flow limiting stenosis. No hemodynamically significant stenosis of the carotid arteries. Asymmetric fullness RIGHT vocal cord, recommend direct inspection on nonemergent basis. Degenerative cervical spine resulting in multilevel severe neural foraminal narrowing.   Mr Brain Wo Contrast 12/02/2016 Punctate LEFT pontine infarct versus artifact. RIGHT V4 segment occlusion. Old small LEFT cerebellar infarct and mild chronic small vessel ischemic disease. Left posterior temporal punctate DWI changes.   TTE  Left ventricle: The cavity size was normal. Systolic function was normal. The estimated ejection fraction was in the range of 60%to 65%. Wall motion was normal; there were no regional wallmotion abnormalities. Left ventricular diastolic functionparameters were normal.  LE venous doppler  No evidence of deep vein thrombosis or baker's cysts bilaterally.  Cerebral angio  1  Near complete occlusion of RT VBJ just distal to RT PICA.   2  50 to 70 % stenosis of LT MCA distal M1 seg     HISTORY OF PRESENT ILLNESS Hector JockJames C Hodgesis an 56 y.o.malewho presents with acute onset of crossed neurological deficits. Symptoms began with headache at 8:30 PM. 20 minutes later, he noticed trouble swallowing, dysarthria, left arm paresthesias with sensory numbness and left arm heaviness. Initially  thought to be a possible MI, but while in ambulance he vomited and then developed diplopia with leftward deviation of left eye relative to right, in conjunction with nystagmus. He was last known well at 8:50 PM on 12/01/2016.  Code Stroke was called. BP 172/95, CBG 114. The patient rates his headache as 8/10. Describes vertigo as combined spinning and floating with sensation of disequilibrium. He has no prior history of vertigo. Denies history of ischemic stroke, ICH or MI. NIHSS: 4. 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 Patient was administered IV t-PA 12/01/2016 at 2330. Plans are for cerebral angiogram in a.m. to consider for thrombectomy. Dr. Nigel BridgemanLinton discussed with Dr. Loni Beckwitheveswhar and patient. He was admitted to the medical ICU for further evaluation and treatment.    HOSPITAL COURSE Mr. Delsa BernJames C Christy is a 56 y.o. male with history of cigarette smoking cigarettes presenting with headache with vertigo,  left arm numbness and weakness and right facial droop. He received IV t-PA 12/01/2016 at 2330.   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 Risk factor including heavy smoker, alcohol use and cocaine use. However, questionable left posterior temporal punctate infarct, will need 30 day cardiac event monitoring as outpt.  Resultant  Unsustained rotational nystagmus on upward gaze  Code stroke CT No acute stroke. Aspects 10  CTA head emergent right VA occlusion.  CTA neck unremarkable  MRI  punctate left pontine infarct. Right V4 occlusion. Old small left cerebellar infarct. Questionable left posterior temporal punctate infarct.  Cerebral angiogram confirmed right VBJ near occlusion   CT head at 24h post tPA no acute abnormalities  2D Echo EF 60-65%  LE venous doppler no DVT  Recommend 30 day cardiac event monitoring as outpt to rule out afib  LDL 134  HgbA1c 4.9  No antithrombotic prior to admission, now on DAPT w/ aspirin 325 for 3 months and  then plavix alone.  Ongoing aggressive stroke risk factor management  Therapy recommendations:   no therapy needs  Disposition:   return home   Cocaine use  Pt denies   UDS showed positive cocaine  Cessation counseling provided  Pt is willing to quit  Tobacco abuse  Current smoker  Smoking cessation counseling provided  Pt is willing to quit  Alcohol use  Alcohol level elevated on admission  Pt admitted 4 beers at least daily  On CIWA protocol  Blood Pressure  Stable  No hx hypertension  Long-term BP goal normotensive  Hyperlipidemia  Home meds:  No statin  LDL 134, goal < 70  Added lipitor 40  Continue statin at discharge  Other Active Problems  Asymmetric fullness right vocal cord, needs outpatient follow-up.  Not seeing doctors for years   DISCHARGE EXAM Blood pressure (!) 143/92, pulse 66, temperature 97.9 F (36.6 C), temperature source Oral, resp. rate (!) 24, weight 76.2 kg (167 lb 15.9 oz), SpO2 97 %. General - Well nourished, well developed, in no apparent distress.  Ophthalmologic - Fundi not visualized due to noncooperation.  Cardiovascular - Regular rate and rhythm.  Mental Status -  Level of arousal and orientation to time, place, and person were intact. Language including expression, naming, repetition, comprehension was assessed and found intact. Fund of Knowledge was assessed and was intact.  Cranial Nerves II - XII - II - Visual field intact OU. III, IV, VI - Extraocular movements intact. V - Facial sensation intact bilaterally. VII - Facial movement intact bilaterally. VIII - Hearing & vestibular intact bilaterally, no nystagmus. X - Palate elevates symmetrically. XI - Chin turning & shoulder shrug intact bilaterally. XII - Tongue protrusion intact.  Motor Strength - The patient's strength was normal in all extremities and pronator drift was absent.  Bulk was normal and fasciculations were absent.    Motor Tone - Muscle tone was assessed at the neck and appendages and was normal.  Reflexes - The patient's reflexes were 1+ in all extremities and he had no pathological reflexes.  Sensory - Light touch, temperature/pinprick were assessed and were symmetrical.    Coordination - The patient had normal movements in the hands and feet with no ataxia or dysmetria.  Tremor was absent.  Gait and Station - deferred.   Discharge Diet   Diet Heart Room service appropriate? Yes; Fluid consistency: Thin liquids  DISCHARGE PLAN  Disposition:  Return home  Dual antiplatelet therapy with  aspirin 325 mg and Plavix 75 mg daily 3 months then Plavix alone for secondary stroke prevention.  30 day monitoring to look for atrial fibrillation arranged with [facility]. Office will call patient and arrange   Ongoing risk factor control by Primary Care Physician at time of discharge  Follow-up No primary care provider on file. in 2 weeks.  Follow-up with Dr. Marvel Plan, Stroke Clinic in 6 weeks, office to schedule an appointment.  40 minutes were spent preparing discharge.  Marvel Plan, MD PhD Stroke Neurology 12/05/2016 12:47 AM

## 2016-12-09 ENCOUNTER — Other Ambulatory Visit: Payer: Self-pay | Admitting: Cardiology

## 2016-12-09 ENCOUNTER — Telehealth: Payer: Self-pay | Admitting: Neurology

## 2016-12-09 ENCOUNTER — Ambulatory Visit (INDEPENDENT_AMBULATORY_CARE_PROVIDER_SITE_OTHER): Payer: BLUE CROSS/BLUE SHIELD

## 2016-12-09 DIAGNOSIS — I4891 Unspecified atrial fibrillation: Secondary | ICD-10-CM

## 2016-12-09 DIAGNOSIS — I639 Cerebral infarction, unspecified: Secondary | ICD-10-CM | POA: Diagnosis not present

## 2016-12-09 NOTE — Telephone Encounter (Signed)
Yes, he can take tylenol 650mg  every 6 hours as needed for HA. Try to avoid ibuprofen or naproxen for HA as he is on two mild blood thinners. Thanks.   Marvel PlanJindong Eddrick Dilone, MD PhD Stroke Neurology 12/09/2016 5:04 PM

## 2016-12-09 NOTE — Telephone Encounter (Signed)
Patients wife called to be sure it will be okay for patient to take Tylenol for headaches after just having a stroke on 12/01/16.  Please call

## 2016-12-10 NOTE — Telephone Encounter (Signed)
If patients wife calls back ,Im unable to speak with her. Pt only has his mother listed down as emergency contact. I can only speak to pt or his mother due to hippa guidelines.  Rn left vm that he is to only take tylenol 650mg  every 6 hrs for headache. Pt is to avoid aleve, naproxen, ibuprofen.Because he is on two blood thinners.

## 2016-12-18 ENCOUNTER — Inpatient Hospital Stay (HOSPITAL_COMMUNITY): Admission: RE | Admit: 2016-12-18 | Payer: BLUE CROSS/BLUE SHIELD | Source: Ambulatory Visit

## 2016-12-24 ENCOUNTER — Ambulatory Visit (INDEPENDENT_AMBULATORY_CARE_PROVIDER_SITE_OTHER): Payer: BLUE CROSS/BLUE SHIELD | Admitting: Neurology

## 2016-12-24 ENCOUNTER — Encounter: Payer: Self-pay | Admitting: Neurology

## 2016-12-24 VITALS — BP 120/73 | HR 72 | Ht 70.0 in | Wt 173.5 lb

## 2016-12-24 DIAGNOSIS — F1410 Cocaine abuse, uncomplicated: Secondary | ICD-10-CM | POA: Diagnosis not present

## 2016-12-24 DIAGNOSIS — F1010 Alcohol abuse, uncomplicated: Secondary | ICD-10-CM

## 2016-12-24 DIAGNOSIS — F17200 Nicotine dependence, unspecified, uncomplicated: Secondary | ICD-10-CM | POA: Diagnosis not present

## 2016-12-24 DIAGNOSIS — E785 Hyperlipidemia, unspecified: Secondary | ICD-10-CM

## 2016-12-24 DIAGNOSIS — I63211 Cerebral infarction due to unspecified occlusion or stenosis of right vertebral arteries: Secondary | ICD-10-CM

## 2016-12-24 NOTE — Patient Instructions (Signed)
-   continue ASA and plavix for another 2 months and then plavix alone - continue lipitor for stroke prevention - follow up heart monitoring  - check BP at home and record - Follow up with your primary care physician for stroke risk factor modification. Recommend maintain blood pressure goal <130/80, diabetes with hemoglobin A1c goal below 7.0% and lipids with LDL cholesterol goal below 70 mg/dL.  - quit smoking completely - continue abstain from illicit drugs.  - limit you alcohol to less than 2 drinks per day.  - healthy diet and regular exercise  - follow up in 4 months

## 2016-12-24 NOTE — Progress Notes (Signed)
STROKE NEUROLOGY FOLLOW UP NOTE  NAME: Hector Booker DOB: 01-01-1961  REASON FOR VISIT: stroke follow up HISTORY FROM: pt and chart  Today we had the pleasure of seeing Hector Booker in follow-up at our Neurology Clinic. Pt was accompanied by no one.   History Summary Mr.Hector C Hodgesis a 56 y.o.malewith history of smoking and alcohol abuse presenting with headache with vertigo, left arm numbness and weakness and right facial droop. Exam showed unsustained rotational nystagmus on upward gaze. HereceivedIV t-PA. MRI showed punctate left pontine infarct with questionable left posterior temporal punctate infarct. Old small left cerebellar infarct. UDS positive for cocaine. CTA head right VA occlusion. CTA neck unremarkable. 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 EF 60-65%. Negative DVT. LDL 134 and A1C 4.9. He was discharged with DAPT and lipitor. Recommend 30 day cardiac monitoring.  Interval History During the interval time, the patient has been doing well. Currently has 30 day cardiac event monitoring. On DAPT and lipitor, no side effects. Still not quit smoking yet, but cut down a lot. Plan to quit soon. Denies any illicit drugs since discharge. Occasional HA, tylenol helps. BP today 120/75.  REVIEW OF SYSTEMS: Full 14 system review of systems performed and notable only for those listed below and in HPI above, all others are negative:  Constitutional:  Activity change Cardiovascular:  Ear/Nose/Throat:  Ringing in ears Skin:  Eyes:   Respiratory:   Gastroitestinal:   Genitourinary:  Hematology/Lymphatic:   Endocrine:  Musculoskeletal:  Joint pain, walking difficulty Allergy/Immunology:   Neurological:  HA Psychiatric: depression Sleep:   The following represents the patient's updated allergies and side effects list: Allergies  Allergen Reactions  . Penicillins Itching    The neurologically relevant items on the patient's problem list were reviewed on  today's visit.  Neurologic Examination  A problem focused neurological exam (12 or more points of the single system neurologic examination, vital signs counts as 1 point, cranial nerves count for 8 points) was performed.  Blood pressure 120/73, pulse 72, height 5\' 10"  (1.778 m), weight 173 lb 8 oz (78.7 kg).  General - Well nourished, well developed, in no apparent distress.  Ophthalmologic - Sharp disc margins OU.   Cardiovascular - Regular rate and rhythm with no murmur.  Mental Status -  Level of arousal and orientation to time, place, and person were intact. Language including expression, naming, repetition, comprehension was assessed and found intact Fund of Knowledge was assessed and was intact.  Cranial Nerves II - XII - II - Visual field intact OU. III, IV, VI - Extraocular movements intact. V - Facial sensation intact bilaterally. VII - Facial movement intact bilaterally. VIII - Hearing & vestibular intact bilaterally, no more nystagmus. X - Palate elevates symmetrically. XI - Chin turning & shoulder shrug intact bilaterally. XII - Tongue protrusion intact.  Motor Strength - The patient's strength was normal in all extremities and pronator drift was absent.  Bulk was normal and fasciculations were absent.   Motor Tone - Muscle tone was assessed at the neck and appendages and was normal.  Reflexes - The patient's reflexes were 1+ in all extremities and he had no pathological reflexes.  Sensory - Light touch, temperature/pinprick, vibration and proprioception, and Romberg testing were assessed and were normal except Romberg teetering but no fall.    Coordination - The patient had normal movements in the hands and feet with no ataxia or dysmetria.  Tremor was absent.  Gait and Station - The patient's  transfers, posture, gait, station, and turns were observed as normal.   Functional score  mRS = 0   0 - No symptoms.   1 - No significant disability. Able to carry  out all usual activities, despite some symptoms.   2 - Slight disability. Able to look after own affairs without assistance, but unable to carry out all previous activities.   3 - Moderate disability. Requires some help, but able to walk unassisted.   4 - Moderately severe disability. Unable to attend to own bodily needs without assistance, and unable to walk unassisted.   5 - Severe disability. Requires constant nursing care and attention, bedridden, incontinent.   6 - Dead.   NIH Stroke Scale = 0   Data reviewed: I personally reviewed the images and agree with the radiology interpretations.  Ct Head Code Stroke W/o Cm 12/01/2016 1. No acute intracranial process. Mild chronic small vessel ischemic disease. 2. ASPECTS is 10.   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 Mild - moderate luminal irregularity of the intracranial vessels compatible with atherosclerosis.   CTA neck 12/02/2016 Luminal irregularity RIGHT vertebral artery compatible with age indeterminate injury, without flow limiting stenosis. No hemodynamically significant stenosis of the carotid arteries. Asymmetric fullness RIGHT vocal cord, recommend direct inspection on nonemergent basis. Degenerative cervical spine resulting in multilevel severe neural foraminal narrowing.   Mr Brain Wo Contrast 12/02/2016 Punctate LEFT pontine infarct versus artifact. RIGHT V4 segment occlusion. Old small LEFT cerebellar infarct and mild chronic small vessel ischemic disease. Left posterior temporal punctate DWI changes.   TTE Left ventricle: The cavity size was normal. Systolic function was normal. The estimated ejection fraction was in the range of 60%to 65%. Wall motion was normal; there were no regional wallmotion abnormalities. Left ventricular diastolic functionparameters were normal.  LE venous doppler  No evidence of deep vein thrombosis or baker's cysts bilaterally.  Cerebral  angio  1  Near complete occlusion of RT VBJ just distal to RT PICA.   2  50 to 70 % stenosis of LT MCA distal M1 seg  Component     Latest Ref Rng & Units 12/01/2016 12/02/2016  Cholesterol     0 - 200 mg/dL  657181  Triglycerides     <150 mg/dL  94  HDL Cholesterol     >40 mg/dL  28 (L)  Total CHOL/HDL Ratio     RATIO  6.5  VLDL     0 - 40 mg/dL  19  LDL (calc)     0 - 99 mg/dL  846134 (H)  Hemoglobin [id number]     4.8 - 5.6 % 4.9   Mean Plasma Glucose     mg/dL 94     Assessment: As you may recall, he is a 56 y.o. Caucasian male with PMH of smoking and alcohol abuse admitted on 12/01/16 for punctate left pontine infarct with questionable left posterior temporal punctate infarct. Old small left cerebellar infarct. Received tPA. UDS positive for cocaine. CTA head right VA occlusion. CTA neck unremarkable. 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 EF 60-65%. Negative DVT. LDL 134 and A1C 4.9. He was discharged with DAPT and lipitor. Currently has 30 day cardiac event monitoring. Still not quit smoking yet.  Plan:  - continue ASA and plavix for another 2 months and then plavix alone - continue lipitor for stroke prevention - follow up with 30 day cardiac event monitoring  - check BP at home and record - Follow  up with your primary care physician for stroke risk factor modification. Recommend maintain blood pressure goal <130/80, diabetes with hemoglobin A1c goal below 7.0% and lipids with LDL cholesterol goal below 70 mg/dL.  - quit smoking completely - continue abstain from illicit drugs.  - limit alcohol to less than 2 drinks per day.  - healthy diet and regular exercise  - follow up in 4 months   No orders of the defined types were placed in this encounter.   Meds ordered this encounter  Medications  . sildenafil (VIAGRA) 100 MG tablet    Sig: TAKE 1/2 TO ONE TABLET AN HOUR BEFORE SEX AS NEEDED FOR ED.    Refill:  11  . acetaminophen (TYLENOL) 325 MG tablet    Sig: Take 650  mg by mouth every 6 (six) hours as needed.    Patient Instructions  - continue ASA and plavix for another 2 months and then plavix alone - continue lipitor for stroke prevention - follow up heart monitoring  - check BP at home and record - Follow up with your primary care physician for stroke risk factor modification. Recommend maintain blood pressure goal <130/80, diabetes with hemoglobin A1c goal below 7.0% and lipids with LDL cholesterol goal below 70 mg/dL.  - quit smoking completely - continue abstain from illicit drugs.  - limit you alcohol to less than 2 drinks per day.  - healthy diet and regular exercise  - follow up in 4 months    Marvel Plan, MD PhD [facility] [address], [address] [place], [place] [address] [phone]

## 2016-12-25 DIAGNOSIS — I63211 Cerebral infarction due to unspecified occlusion or stenosis of right vertebral arteries: Secondary | ICD-10-CM | POA: Insufficient documentation

## 2017-01-05 ENCOUNTER — Telehealth (INDEPENDENT_AMBULATORY_CARE_PROVIDER_SITE_OTHER): Payer: Self-pay | Admitting: Rheumatology

## 2017-01-05 NOTE — Telephone Encounter (Signed)
Orville Governita Quilter calling for Lurena NidaJames Greenup, she left message on voice mail asking about a referral for Mr Yetta FlockHodges to Dr Corliss Skainseveshwar. cb is [phone]. The recording was muffled and difficult to understand.   I returned call but got voice mail. Perhaps there is correspondence that is awaiting Dr Fatima Sangereveshwar's approval or denial on accepting patient's care.

## 2017-01-06 ENCOUNTER — Telehealth: Payer: Self-pay | Admitting: Rheumatology

## 2017-01-06 NOTE — Telephone Encounter (Signed)
I have not received a referral for this patient.

## 2017-01-06 NOTE — Telephone Encounter (Signed)
Have we received a referral for patient? The referring office called and states they have faxed it several times.

## 2017-01-21 ENCOUNTER — Telehealth (HOSPITAL_COMMUNITY): Payer: Self-pay | Admitting: Family Medicine

## 2017-01-29 ENCOUNTER — Emergency Department (HOSPITAL_COMMUNITY)
Admission: EM | Admit: 2017-01-29 | Discharge: 2017-01-29 | Disposition: A | Discharge status: Home / Self Care | Payer: BLUE CROSS/BLUE SHIELD | Attending: Emergency Medicine | Admitting: Emergency Medicine

## 2017-01-29 ENCOUNTER — Emergency Department (HOSPITAL_COMMUNITY): Payer: BLUE CROSS/BLUE SHIELD

## 2017-01-29 ENCOUNTER — Encounter (HOSPITAL_COMMUNITY): Payer: Self-pay | Admitting: Emergency Medicine

## 2017-01-29 DIAGNOSIS — Z79899 Other long term (current) drug therapy: Secondary | ICD-10-CM | POA: Diagnosis not present

## 2017-01-29 DIAGNOSIS — Z8673 Personal history of transient ischemic attack (TIA), and cerebral infarction without residual deficits: Secondary | ICD-10-CM | POA: Insufficient documentation

## 2017-01-29 DIAGNOSIS — R531 Weakness: Secondary | ICD-10-CM | POA: Diagnosis not present

## 2017-01-29 DIAGNOSIS — Z5181 Encounter for therapeutic drug level monitoring: Secondary | ICD-10-CM | POA: Diagnosis not present

## 2017-01-29 DIAGNOSIS — Z7982 Long term (current) use of aspirin: Secondary | ICD-10-CM | POA: Diagnosis not present

## 2017-01-29 DIAGNOSIS — F17210 Nicotine dependence, cigarettes, uncomplicated: Secondary | ICD-10-CM | POA: Diagnosis not present

## 2017-01-29 DIAGNOSIS — R42 Dizziness and giddiness: Secondary | ICD-10-CM | POA: Insufficient documentation

## 2017-01-29 LAB — CBC
HCT: 43.8 % (ref 39.0–52.0)
Hemoglobin: 15 g/dL (ref 13.0–17.0)
MCH: 30.9 pg (ref 26.0–34.0)
MCHC: 34.2 g/dL (ref 30.0–36.0)
MCV: 90.1 fL (ref 78.0–100.0)
Platelets: 237 10*3/uL (ref 150–400)
RBC: 4.86 MIL/uL (ref 4.22–5.81)
RDW: 13.5 % (ref 11.5–15.5)
WBC: 9.6 10*3/uL (ref 4.0–10.5)

## 2017-01-29 LAB — URINALYSIS, ROUTINE W REFLEX MICROSCOPIC
BILIRUBIN URINE: NEGATIVE
Glucose, UA: NEGATIVE mg/dL
HGB URINE DIPSTICK: NEGATIVE
Ketones, ur: NEGATIVE mg/dL
Leukocytes, UA: NEGATIVE
NITRITE: NEGATIVE
PROTEIN: NEGATIVE mg/dL
Specific Gravity, Urine: 1.028 (ref 1.005–1.030)
pH: 5 (ref 5.0–8.0)

## 2017-01-29 LAB — I-STAT TROPONIN, ED: Troponin i, poc: 0 ng/mL (ref 0.00–0.08)

## 2017-01-29 LAB — COMPREHENSIVE METABOLIC PANEL
ALT: 28 U/L (ref 17–63)
ANION GAP: 10 (ref 5–15)
AST: 28 U/L (ref 15–41)
Albumin: 3.7 g/dL (ref 3.5–5.0)
Alkaline Phosphatase: 91 U/L (ref 38–126)
BILIRUBIN TOTAL: 0.7 mg/dL (ref 0.3–1.2)
BUN: 14 mg/dL (ref 6–20)
CALCIUM: 9 mg/dL (ref 8.9–10.3)
CO2: 24 mmol/L (ref 22–32)
CREATININE: 0.58 mg/dL — AB (ref 0.61–1.24)
Chloride: 106 mmol/L (ref 101–111)
Glucose, Bld: 112 mg/dL — ABNORMAL HIGH (ref 65–99)
Potassium: 3.9 mmol/L (ref 3.5–5.1)
Sodium: 140 mmol/L (ref 135–145)
TOTAL PROTEIN: 6.6 g/dL (ref 6.5–8.1)

## 2017-01-29 LAB — PROTIME-INR
INR: 1.04
Prothrombin Time: 13.6 seconds (ref 11.4–15.2)

## 2017-01-29 LAB — RAPID URINE DRUG SCREEN, HOSP PERFORMED
AMPHETAMINES: NOT DETECTED
BENZODIAZEPINES: NOT DETECTED
Barbiturates: NOT DETECTED
Cocaine: POSITIVE — AB
Opiates: POSITIVE — AB
Tetrahydrocannabinol: NOT DETECTED

## 2017-01-29 LAB — I-STAT CHEM 8, ED
BUN: 15 mg/dL (ref 6–20)
CREATININE: 0.5 mg/dL — AB (ref 0.61–1.24)
Calcium, Ion: 1.09 mmol/L — ABNORMAL LOW (ref 1.15–1.40)
Chloride: 106 mmol/L (ref 101–111)
GLUCOSE: 114 mg/dL — AB (ref 65–99)
HCT: 44 % (ref 39.0–52.0)
HEMOGLOBIN: 15 g/dL (ref 13.0–17.0)
POTASSIUM: 3.8 mmol/L (ref 3.5–5.1)
Sodium: 141 mmol/L (ref 135–145)
TCO2: 24 mmol/L (ref 0–100)

## 2017-01-29 LAB — APTT: aPTT: 32 seconds (ref 24–36)

## 2017-01-29 LAB — DIFFERENTIAL
Basophils Absolute: 0 10*3/uL (ref 0.0–0.1)
Basophils Relative: 0 %
EOS PCT: 0 %
Eosinophils Absolute: 0 10*3/uL (ref 0.0–0.7)
LYMPHS ABS: 1.3 10*3/uL (ref 0.7–4.0)
LYMPHS PCT: 14 %
MONO ABS: 0.6 10*3/uL (ref 0.1–1.0)
MONOS PCT: 6 %
NEUTROS ABS: 7.6 10*3/uL (ref 1.7–7.7)
Neutrophils Relative %: 80 %

## 2017-01-29 LAB — ETHANOL: Alcohol, Ethyl (B): <5 mg/dL (ref <5)

## 2017-01-29 LAB — CBG MONITORING, ED: Glucose-Capillary: 126 mg/dL — ABNORMAL HIGH (ref 65–99)

## 2017-01-29 MED ORDER — IOPAMIDOL (ISOVUE-370) INJECTION 76%
INTRAVENOUS | Status: AC
  Administered 2017-01-29: 50 mL
  Filled 2017-01-29: qty 50

## 2017-01-29 MED ORDER — LORAZEPAM 2 MG/ML IJ SOLN
1.0000 mg | Freq: Once | INTRAMUSCULAR | Status: AC
  Administered 2017-01-29: 1 mg via INTRAVENOUS
  Filled 2017-01-29: qty 1

## 2017-01-29 MED ORDER — METOCLOPRAMIDE HCL 5 MG/ML IJ SOLN
10.0000 mg | Freq: Once | INTRAMUSCULAR | Status: AC
  Administered 2017-01-29: 10 mg via INTRAVENOUS
  Filled 2017-01-29: qty 2

## 2017-01-29 MED ORDER — METOCLOPRAMIDE HCL 5 MG/ML IJ SOLN: 10.0000 mg | Freq: Once | INTRAMUSCULAR | Status: DC

## 2017-01-29 NOTE — ED Notes (Addendum)
MRI called, pt will be transported for scan in approx 90 minutes, pt updated.

## 2017-01-29 NOTE — ED Notes (Signed)
Patient states he woke up at 6 am today c/o dizziness states he gets nauseated when he turns his head to the side. States after getting up he felt like his legs were weak. Feels like he was weak in his right arm. Bilateral grips equal and strong. Moves all ext x 4. Alert oriented,.

## 2017-01-29 NOTE — ED Notes (Signed)
Patient transported to MRI 

## 2017-01-29 NOTE — ED Triage Notes (Signed)
Arrived via EMS Onset today woke up with dizziness and headache right side of head 7/10 achy. Alert answering and following commands appropriate.

## 2017-01-29 NOTE — ED Provider Notes (Signed)
[facility] DEPT Provider Note   CSN: [id number] Arrival date & time: 01/29/17  1450     History   Chief Complaint Chief Complaint  Patient presents with  . Dizziness  . Headache    HPI Hector Booker is a 56 y.o. male.  HPI  56 year old male with history of recent stroke with TPA administered on 12/01/2016. At that time the patient presented with right-sided facial droop, dizziness, and left arm weakness. 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 Patient was started on Plavix and Lipitor during that hospitalization. Was found to also have a punctate left pontine infarct. He reports that his symptoms completely resolved on discharge home, but this morning he woke up with dizziness and a headache described as a burning sensation to the right side of the face, right-sided facial droop, and left hemibody weakness. Dizziness is described as an unsteadiness when he gets to his feet. States that his symptoms are more subtle than they were last time he presented. Endorses having a dry cough over the last few days, but no other symptoms including fever, nausea, vomiting, chest pain, shortness of breath, abdominal pain, dysuria, frequency. No vision changes or speech difficulty. States he has been compliant with his home Plavix. Patient does have a history of cocaine abuse.  Past Medical History:  Diagnosis Date  . Arthritis   . Hyperlipemia   . Stroke [facility]     Patient Active Problem List   Diagnosis Date Noted  . 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 Alcohol abuse 12/04/2016  . 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 Hyperlipidemia 12/02/2016  . Smoker 12/02/2016  . Cocaine abuse 12/02/2016  . Left pontine stroke [facility]) s/p tPA 12/01/2016    Past Surgical History:  Procedure Laterality Date  . IR GENERIC HISTORICAL  12/02/2016   IR ANGIO  VERTEBRAL SEL VERTEBRAL BILAT MOD SED 12/02/2016 Julieanne Cotton, MD [facility]  . IR GENERIC HISTORICAL  12/02/2016   IR ANGIO INTRA EXTRACRAN SEL COM CAROTID INNOMINATE BILAT MOD SED 12/02/2016 Julieanne Cotton, MD [facility]       Home Medications    Prior to Admission medications   Medication Sig Start Date End Date Taking? Authorizing Provider  acetaminophen (TYLENOL) 325 MG tablet Take 325 mg by mouth every 6 (six) hours as needed (for pain or headaches).    Yes Historical Provider, MD  aspirin 325 MG EC tablet Take 1 tablet (325 mg total) by mouth daily. STOP taking after 90 days. 12/05/16 03/05/17 Yes Layne Benton, NP  atorvastatin (LIPITOR) 40 MG tablet Take 1 tablet (40 mg total) by mouth daily at 6 PM. 12/04/16  Yes Layne Benton, NP  clopidogrel (PLAVIX) 75 MG tablet Take 1 tablet (75 mg total) by mouth daily. 12/05/16  Yes Layne Benton, NP  gabapentin (NEURONTIN) 100 MG capsule Take 100 mg by mouth 2 (two) times daily. FOR PAIN OR NEUROPATHY 12/27/16  Yes Historical Provider, MD  HYDROcodone-acetaminophen (NORCO/VICODIN) 5-325 MG tablet Take 1 tablet by mouth 2 (two) times daily as needed for pain. for pain 01/23/17  Yes Historical Provider, MD  ibuprofen (ADVIL,MOTRIN) 200 MG tablet Take 200-400 mg by mouth every 6 (six) hours as needed (for pain or headaches).   Yes Historical Provider, MD  sildenafil (VIAGRA) 100 MG tablet TAKE 1/2 TO ONE TABLET AN HOUR BEFORE SEX  AS NEEDED FOR ED. 12/05/16   Historical Provider, MD    Family History Family History  Problem Relation Age of Onset  . Heart attack Father     Social History Social History  Substance Use Topics  . Smoking status: Current Every Day Smoker    Packs/day: 0.25    Types: Cigarettes  . Smokeless tobacco: Never Used     Comment: black mild 2 day  . Alcohol use 7.2 oz/week    12 Cans of beer per week     Comment: 1 beer a week     Allergies   Penicillins   Review of Systems Review of Systems    Constitutional: Negative for chills, diaphoresis and fever.  HENT: Negative for facial swelling, rhinorrhea and sore throat.   Eyes: Negative for visual disturbance.  Respiratory: Positive for cough (dry, non-productive). Negative for shortness of breath and wheezing.   Cardiovascular: Negative for chest pain and palpitations.  Gastrointestinal: Negative for abdominal pain, diarrhea, nausea and vomiting.  Genitourinary: Negative for dysuria and frequency.  Musculoskeletal: Negative for arthralgias, back pain, joint swelling, myalgias, neck pain and neck stiffness.  Skin: Negative for color change and rash.  Neurological: Positive for dizziness, facial asymmetry, weakness (L-hemibody) and headaches. Negative for tremors, syncope, speech difficulty, light-headedness and numbness.  Psychiatric/Behavioral: Negative for agitation, behavioral problems and confusion.     Physical Exam Updated Vital Signs BP 131/77 (BP Location: Right Arm)   Pulse 62   Temp 97.7 F (36.5 C)   Resp 18   Wt 78.5 kg   SpO2 98%   BMI 24.82 kg/m   Physical Exam  Constitutional: He is oriented to person, place, and time. He appears well-developed and well-nourished. No distress.  HENT:  Head: Normocephalic and atraumatic.  Right Ear: External ear normal.  Left Ear: External ear normal.  Nose: Nose normal.  Mouth/Throat: Oropharynx is clear and moist. No oropharyngeal exudate.  Eyes: Conjunctivae and EOM are normal. Pupils are equal, round, and reactive to light. Right eye exhibits no discharge. Left eye exhibits no discharge. No scleral icterus.  Neck: Normal range of motion. Neck supple.  Cardiovascular: Normal rate, regular rhythm, normal heart sounds and intact distal pulses.  Exam reveals no gallop and no friction rub.   No murmur heard. Pulmonary/Chest: Effort normal and breath sounds normal. No respiratory distress. He has no wheezes. He has no rales.  Abdominal: Soft. Bowel sounds are normal. He  exhibits no distension. There is no tenderness. There is no guarding.  Musculoskeletal: Normal range of motion. He exhibits no edema or tenderness.  Neurological: He is alert and oriented to person, place, and time.  Slight facial droop with smile on the R. 5/5 strength in the R hemibody, 4+/5 strength in the L hemibody. Slight decreased sensation to light touch in the L arm, otherwise normal. Normal finger to nose, rapid alternating movements, and heel-to-shin. Normal speech.   Skin: Skin is warm and dry. No rash noted. He is not diaphoretic.  Psychiatric: He has a normal mood and affect. His behavior is normal. Judgment and thought content normal.     ED Treatments / Results  Labs (all labs ordered are listed, but only abnormal results are displayed) Labs Reviewed  COMPREHENSIVE METABOLIC PANEL - Abnormal; Notable for the following:       Result Value   Glucose, Bld 112 (*)    Creatinine, Ser 0.58 (*)    All other components within normal limits  RAPID URINE DRUG SCREEN,  HOSP PERFORMED - Abnormal; Notable for the following:    Opiates POSITIVE (*)    Cocaine POSITIVE (*)    All other components within normal limits  CBG MONITORING, ED - Abnormal; Notable for the following:    Glucose-Capillary 126 (*)    All other components within normal limits  I-STAT CHEM 8, ED - Abnormal; Notable for the following:    Creatinine, Ser 0.50 (*)    Glucose, Bld 114 (*)    Calcium, Ion 1.09 (*)    All other components within normal limits  PROTIME-INR  APTT  CBC  DIFFERENTIAL  ETHANOL  URINALYSIS, ROUTINE W REFLEX MICROSCOPIC  I-STAT TROPOININ, ED  I-STAT TROPOININ, ED    EKG  EKG Interpretation  Date/Time:  Thursday January 29 2017 15:03:44 EDT Ventricular Rate:  58 PR Interval:  138 QRS Duration: 88 QT Interval:  440 QTC Calculation: 431 R Axis:   71 Text Interpretation:  Sinus bradycardia Otherwise normal ECG When comapred to prior, no significant changes seen.  No STEMI Confirmed  by Rush LandmarkEGELER MD, CHRISTOPHER [phone]([id number]) on 01/29/2017 3:05:55 PM Also confirmed by Rush LandmarkEGELER MD, CHRISTOPHER [phone]), editor Misty StanleyScales-Price, Shannon [phone])  on 01/29/2017 3:33:50 PM       Radiology Ct Angio Head W Or Wo Contrast  Result Date: 01/29/2017 CLINICAL DATA:  Right-sided facial droop. Left-sided weakness. Dizziness. EXAM: CT ANGIOGRAPHY HEAD AND NECK TECHNIQUE: Multidetector CT imaging of the head and neck was performed using the standard protocol during bolus administration of intravenous contrast. Multiplanar CT image reconstructions and MIPs were obtained to evaluate the vascular anatomy. Carotid stenosis measurements (when applicable) are obtained utilizing NASCET criteria, using the distal internal carotid diameter as the denominator. CONTRAST:  50 mL Isovue 370 COMPARISON:  12/01/2016 head and neck CTA. 12/03/2016 head CT. 12/02/2016 brain MRI. FINDINGS: CT HEAD FINDINGS Brain: There is no evidence of acute cortical infarct, intracranial hemorrhage, mass, midline shift, or extra-axial fluid collection. The ventricles and sulci are normal. Cerebral white matter hypodensities are similar to the prior CT and nonspecific but compatible with mild chronic small vessel ischemic disease. Vascular: Calcified atherosclerosis at the skullbase. No hyperdense vessel. Skull: No fracture or focal osseous lesion. Sinuses: Visualized paranasal sinuses and mastoid air cells are clear. Orbits: Unremarkable. Review of the MIP images confirms the above findings CTA NECK FINDINGS Aortic arch: Standard 3 vessel aortic arch. Widely patent brachiocephalic and subclavian arteries. Right carotid system: Patent. Mild calcified and soft plaque at the internal carotid artery origin and in the distal cervical ICA, unchanged and without significant stenosis. Left carotid system: Patent. Minimal atherosclerotic plaque at the carotid bifurcation without stenosis. Vertebral arteries: Patent in the neck with the left being mildly  dominant. Mild luminal irregularity is again noted of the right vertebral artery without significant stenosis in the neck. Skeleton: Mild-to-moderate disc and facet degeneration in the cervical spine. Other neck: Fatty atrophy of the left parotid gland. Symmetric appearance of the vocal cords in contrast to asymmetric right-sided fullness on the prior CTA. Upper chest: Clear lung apices. Review of the MIP images confirms the above findings CTA HEAD FINDINGS Anterior circulation: The internal carotid arteries are patent from skullbase to carotid termini without evidence of significant stenosis. ACAs and MCAs are patent with mild-to-moderate branch vessel irregularity but no evidence of major branch occlusion or flow limiting proximal stenosis. There is mild distal left M1 narrowing the left A1 segment is hypoplastic. No aneurysm. Posterior circulation: Unchanged right vertebral artery occlusion at the mid V4 level with  patent right PICA. Left vertebral artery is patent and supplies the basilar with minimal atherosclerotic narrowing near the dural margin. Patent SCA origins are seen bilaterally. The basilar artery is patent without significant stenosis. PCAs are patent with moderate diffuse irregularity bilaterally. There is a moderate to severe proximal to mid left P2 stenosis which is more prominent than on the prior CTA. No aneurysm. Venous sinuses: Hypoplastic left transverse and sigmoid sinuses which are not well visualized. Other major dural venous sinuses are patent. Anatomic variants: None of significance. Delayed phase: No abnormal enhancement. Review of the MIP images confirms the above findings IMPRESSION: 1. No evidence of acute intracranial abnormality. Mild chronic small vessel ischemic disease. 2. No evidence of emergent large vessel occlusion. 3. Unchanged distal right vertebral artery occlusion. 4. Intracranial atherosclerosis without evidence of flow limiting proximal anterior circulation stenosis.  5. Moderate to severe left P2 PCA stenosis. 6. Mild cervical carotid artery atherosclerosis without stenosis. Electronically Signed   By: Sebastian Ache M.D.   On: 01/29/2017 16:45   Ct Angio Neck W And/or Wo Contrast  Result Date: 01/29/2017 CLINICAL DATA:  Right-sided facial droop. Left-sided weakness. Dizziness. EXAM: CT ANGIOGRAPHY HEAD AND NECK TECHNIQUE: Multidetector CT imaging of the head and neck was performed using the standard protocol during bolus administration of intravenous contrast. Multiplanar CT image reconstructions and MIPs were obtained to evaluate the vascular anatomy. Carotid stenosis measurements (when applicable) are obtained utilizing NASCET criteria, using the distal internal carotid diameter as the denominator. CONTRAST:  50 mL Isovue 370 COMPARISON:  12/01/2016 head and neck CTA. 12/03/2016 head CT. 12/02/2016 brain MRI. FINDINGS: CT HEAD FINDINGS Brain: There is no evidence of acute cortical infarct, intracranial hemorrhage, mass, midline shift, or extra-axial fluid collection. The ventricles and sulci are normal. Cerebral white matter hypodensities are similar to the prior CT and nonspecific but compatible with mild chronic small vessel ischemic disease. Vascular: Calcified atherosclerosis at the skullbase. No hyperdense vessel. Skull: No fracture or focal osseous lesion. Sinuses: Visualized paranasal sinuses and mastoid air cells are clear. Orbits: Unremarkable. Review of the MIP images confirms the above findings CTA NECK FINDINGS Aortic arch: Standard 3 vessel aortic arch. Widely patent brachiocephalic and subclavian arteries. Right carotid system: Patent. Mild calcified and soft plaque at the internal carotid artery origin and in the distal cervical ICA, unchanged and without significant stenosis. Left carotid system: Patent. Minimal atherosclerotic plaque at the carotid bifurcation without stenosis. Vertebral arteries: Patent in the neck with the left being mildly dominant. Mild  luminal irregularity is again noted of the right vertebral artery without significant stenosis in the neck. Skeleton: Mild-to-moderate disc and facet degeneration in the cervical spine. Other neck: Fatty atrophy of the left parotid gland. Symmetric appearance of the vocal cords in contrast to asymmetric right-sided fullness on the prior CTA. Upper chest: Clear lung apices. Review of the MIP images confirms the above findings CTA HEAD FINDINGS Anterior circulation: The internal carotid arteries are patent from skullbase to carotid termini without evidence of significant stenosis. ACAs and MCAs are patent with mild-to-moderate branch vessel irregularity but no evidence of major branch occlusion or flow limiting proximal stenosis. There is mild distal left M1 narrowing the left A1 segment is hypoplastic. No aneurysm. Posterior circulation: Unchanged right vertebral artery occlusion at the mid V4 level with patent right PICA. Left vertebral artery is patent and supplies the basilar with minimal atherosclerotic narrowing near the dural margin. Patent SCA origins are seen bilaterally. The basilar artery is patent without significant  stenosis. PCAs are patent with moderate diffuse irregularity bilaterally. There is a moderate to severe proximal to mid left P2 stenosis which is more prominent than on the prior CTA. No aneurysm. Venous sinuses: Hypoplastic left transverse and sigmoid sinuses which are not well visualized. Other major dural venous sinuses are patent. Anatomic variants: None of significance. Delayed phase: No abnormal enhancement. Review of the MIP images confirms the above findings IMPRESSION: 1. No evidence of acute intracranial abnormality. Mild chronic small vessel ischemic disease. 2. No evidence of emergent large vessel occlusion. 3. Unchanged distal right vertebral artery occlusion. 4. Intracranial atherosclerosis without evidence of flow limiting proximal anterior circulation stenosis. 5. Moderate to  severe left P2 PCA stenosis. 6. Mild cervical carotid artery atherosclerosis without stenosis. Electronically Signed   By: Sebastian Ache M.D.   On: 01/29/2017 16:45   Mr Brain Wo Contrast  Result Date: 01/29/2017 CLINICAL DATA:  Woke up today with nausea, leg weakness, RIGHT facial droop. History of stroke, hyperlipidemia, polysubstance abuse. EXAM: MRI HEAD WITHOUT CONTRAST TECHNIQUE: Multiplanar, multiecho pulse sequences of the brain and surrounding structures were obtained without intravenous contrast. COMPARISON:  CT HEAD January 29, 2017 at 1556 hours and MRI head January 30, 2017 FINDINGS: Multiple sequences are mildly or moderately motion degraded. BRAIN: No reduced diffusion to suggest acute ischemia. No susceptibility artifact to suggest hemorrhage. The ventricles and sulci are normal for patient's age. Old small LEFT cerebellar infarct. A few scattered subcentimeter supratentorial white matter FLAIR T2 hyperintensities most compatible with chronic small vessel ischemic disease, unchanged. No suspicious parenchymal signal, masses or mass effect. No abnormal extra-axial fluid collections. VASCULAR: Similarly attenuated flow void distal RIGHT V4 segment. SKULL AND UPPER CERVICAL SPINE: No abnormal sellar expansion. No suspicious calvarial bone marrow signal. Craniocervical junction maintained. SINUSES/ORBITS: The mastoid air-cells and included paranasal sinuses are well-aerated. The included ocular globes and orbital contents are non-suspicious. OTHER: None. IMPRESSION: No acute intracranial process on this motion degraded examination. Old small LEFT cerebellar infarct and mild chronic small vessel ischemic disease. Chronic slow flow versus occluded RIGHT V4 segment. Electronically Signed   By: Awilda Metro M.D.   On: 01/29/2017 21:26    Procedures Procedures (including critical care time)  Medications Ordered in ED Medications  metoCLOPramide (REGLAN) injection 10 mg (10 mg Intravenous Given by  Other 01/29/17 1855)  iopamidol (ISOVUE-370) 76 % injection (50 mLs  Contrast Given 01/29/17 1551)  LORazepam (ATIVAN) injection 1 mg (1 mg Intravenous Given 01/29/17 2025)     Initial Impression / Assessment and Plan / ED Course  I have reviewed the triage vital signs and the nursing notes.  Pertinent labs & imaging results that were available during my care of the patient were reviewed by me and considered in my medical decision making (see chart for details).     Afebrile and hemodynamically stable. Neuro exam as above. Patient's deficits are consistent with the symptoms he had with his prior stroke, and I am concerned for new stroke vs. reactivation of old stroke deficit. CT head obtained with no acute intracranial abnormalities. Case discussed with neurologist on call, Dr. Amada Jupiter, who recommended obtaining an MRI/MRA of the patient's brain and neck to rule out new stroke. MRI shows unchanged distal right vertebral artery occlusion, and redemonstrates the patient's known small left cerebellar infarct. Patient's UDS is positive for cocaine and opiates. On reassessment his symptoms have improved. Neurology was re-engaged, and I discussed the case again with Dr. Georg Ruddle. He suspects that the patient's symptoms are  secondary to reactivation of the patient's stroke deficits, likely secondary to vasospasm from using cocaine.  I had a long discussion with the patient at bedside regarding his cocaine use and the likelihood that he could die from subsequent strokes should he continue to use cocaine. He states that he has an appointment to discuss getting into drug rehabilitation. He was counseled to stop using cocaine immediately. He was given return precautions for new or worsening deficits. He was discharged in stable condition.  Care of patient overseen by my attending, Dr. Rush Landmark.   Final Clinical Impressions(s) / ED Diagnoses   Final diagnoses:  Dizziness  Left-sided weakness    New  Prescriptions Discharge Medication List as of 01/29/2017 10:41 PM       Jashay Roddy Ernestina Penna, MD 01/30/17 [id number]    Canary Brim Tegeler, MD 02/01/17 [phone]

## 2017-02-09 ENCOUNTER — Ambulatory Visit: Payer: Self-pay | Admitting: Neurology

## 2017-02-18 ENCOUNTER — Encounter (INDEPENDENT_AMBULATORY_CARE_PROVIDER_SITE_OTHER): Payer: Self-pay

## 2017-02-18 ENCOUNTER — Ambulatory Visit (INDEPENDENT_AMBULATORY_CARE_PROVIDER_SITE_OTHER): Payer: BLUE CROSS/BLUE SHIELD | Admitting: Neurology

## 2017-02-18 ENCOUNTER — Encounter: Payer: Self-pay | Admitting: Neurology

## 2017-02-18 VITALS — BP 123/79 | HR 63 | Wt 165.8 lb

## 2017-02-18 DIAGNOSIS — I63211 Cerebral infarction due to unspecified occlusion or stenosis of right vertebral arteries: Secondary | ICD-10-CM | POA: Diagnosis not present

## 2017-02-18 DIAGNOSIS — F1410 Cocaine abuse, uncomplicated: Secondary | ICD-10-CM | POA: Diagnosis not present

## 2017-02-18 DIAGNOSIS — G629 Polyneuropathy, unspecified: Secondary | ICD-10-CM | POA: Diagnosis not present

## 2017-02-18 DIAGNOSIS — F17200 Nicotine dependence, unspecified, uncomplicated: Secondary | ICD-10-CM | POA: Diagnosis not present

## 2017-02-18 DIAGNOSIS — F1010 Alcohol abuse, uncomplicated: Secondary | ICD-10-CM

## 2017-02-18 DIAGNOSIS — E785 Hyperlipidemia, unspecified: Secondary | ICD-10-CM | POA: Diagnosis not present

## 2017-02-18 MED ORDER — GABAPENTIN 300 MG PO CAPS: 300.0000 mg | ORAL_CAPSULE | Freq: Two times a day (BID) | ORAL | 5 refills | Status: DC

## 2017-02-18 NOTE — Progress Notes (Signed)
STROKE NEUROLOGY FOLLOW UP NOTE  NAME: Hector Booker DOB: 1961/02/09  REASON FOR VISIT: stroke follow up HISTORY FROM: pt and chart  Today we had the pleasure of seeing WEBSTER PATRONE in follow-up at our Neurology Clinic. Pt was accompanied by no one.   History Summary Mr.Maximilien C Hodgesis a 56 y.o.malewith history of smoking and alcohol abuse presenting with headache with vertigo, left arm numbness and weakness and right facial droop. Exam showed unsustained rotational nystagmus on upward gaze. HereceivedIV t-PA. MRI showed punctate left pontine infarct with questionable left posterior temporal punctate infarct. Old small left cerebellar infarct. UDS positive for cocaine. CTA head right VA occlusion. CTA neck unremarkable. 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 EF 60-65%. Negative DVT. LDL 134 and A1C 4.9. He was discharged with DAPT and lipitor. Recommend 30 day cardiac monitoring.  12/24/16 follow up - the patient has been doing well. Currently has 30 day cardiac event monitoring. On DAPT and lipitor, no side effects. Still not quit smoking yet, but cut down a lot. Plan to quit soon. Denies any illicit drugs since discharge. Occasional HA, tylenol helps. BP today 120/75.  Interval History Pt had ED visit on 01/29/17 with recurrent symptoms of dizziness, left arm weakness, and right-sided facial droop. CT scan showed no evidence of acute intracranial abnormality. CTA head and neck no LVO and unchanged right VA occlusion. MRI no acute stroke. Old left cerebellar infarct was seen. UDS positive for cocaine. His condition was considered as recrudescence of old stroke or cocaine related vasospasm. He was discharged from ED in good condition and continued on DAPT and lipitor.   During the interval time, he had no complains. Willing to quit cigar and cocaine. BP today 123/79. Complains of left knee pain as well as left thigh, left calf and left foot achy pain with tingling in toes.  Will increase gabapentin dose.   REVIEW OF SYSTEMS: Full 14 system review of systems performed and notable only for those listed below and in HPI above, all others are negative:  Constitutional:  Activity change, fatigue Cardiovascular:  Ear/Nose/Throat:  Runny nose, trouble swallowing Skin:  Eyes:  Double vision Respiratory:  Cough, wheezing, SOB, choking Gastroitestinal:  nausea Genitourinary:  Hematology/Lymphatic:  Bruise easily Endocrine:  Musculoskeletal:  Joint pain, walking difficulty, aching muscles, muscle cramps Allergy/Immunology:  Env allergies Neurological:  HA, dizziness, numbness, weakness Psychiatric:  Sleep: restless leg, snoring, sleep talking  The following represents the patient's updated allergies and side effects list: Allergies  Allergen Reactions  . Penicillins Anaphylaxis    Has patient had a PCN reaction causing immediate rash, facial/tongue/throat swelling, SOB or lightheadedness with hypotension: Yes Has patient had a PCN reaction causing severe rash involving mucus membranes or skin necrosis: No Has patient had a PCN reaction that required hospitalization Yes Has patient had a PCN reaction occurring within the last 10 years: No If all of the above answers are "NO", then may proceed with Cephalosporin use.     The neurologically relevant items on the patient's problem list were reviewed on today's visit.  Neurologic Examination  A problem focused neurological exam (12 or more points of the single system neurologic examination, vital signs counts as 1 point, cranial nerves count for 8 points) was performed.  Blood pressure 123/79, pulse 63, weight 165 lb 12.8 oz (75.2 kg).  General - Well nourished, well developed, in no apparent distress.  Ophthalmologic - Sharp disc margins OU.   Cardiovascular - Regular rate and rhythm  with no murmur.  Mental Status -  Level of arousal and orientation to time, place, and person were intact. Language  including expression, naming, repetition, comprehension was assessed and found intact Fund of Knowledge was assessed and was intact.  Cranial Nerves II - XII - II - Visual field intact OU. III, IV, VI - Extraocular movements intact. V - Facial sensation intact bilaterally. VII - Facial movement intact bilaterally. VIII - Hearing & vestibular intact bilaterally, no more nystagmus. X - Palate elevates symmetrically. XI - Chin turning & shoulder shrug intact bilaterally. XII - Tongue protrusion intact.  Motor Strength - The patient's strength was normal in all extremities and pronator drift was absent.  Bulk was normal and fasciculations were absent.   Motor Tone - Muscle tone was assessed at the neck and appendages and was normal.  Reflexes - The patient's reflexes were 1+ in all extremities and he had no pathological reflexes.  Sensory - Light touch, temperature/pinprick were assessed and were normal   Coordination - The patient had normal movements in the hands and feet with no ataxia or dysmetria.  Tremor was absent.  Gait and Station - The patient's transfers, posture, gait, station, and turns were observed as normal.   Data reviewed: I personally reviewed the images and agree with the radiology interpretations.  Ct Head Code Stroke W/o Cm 12/01/2016 1. No acute intracranial process. Mild chronic small vessel ischemic disease. 2. ASPECTS is 10.   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 Mild - moderate luminal irregularity of the intracranial vessels compatible with atherosclerosis.   CTA neck 12/02/2016 Luminal irregularity RIGHT vertebral artery compatible with age indeterminate injury, without flow limiting stenosis. No hemodynamically significant stenosis of the carotid arteries. Asymmetric fullness RIGHT vocal cord, recommend direct inspection on nonemergent basis. Degenerative cervical spine resulting in multilevel severe neural  foraminal narrowing.   Mr Brain Wo Contrast 12/02/2016 Punctate LEFT pontine infarct versus artifact. RIGHT V4 segment occlusion. Old small LEFT cerebellar infarct and mild chronic small vessel ischemic disease. Left posterior temporal punctate DWI changes.   TTE Left ventricle: The cavity size was normal. Systolic function was normal. The estimated ejection fraction was in the range of 60%to 65%. Wall motion was normal; there were no regional wallmotion abnormalities. Left ventricular diastolic functionparameters were normal.  LE venous doppler  No evidence of deep vein thrombosis or baker's cysts bilaterally.  Cerebral angio  1  Near complete occlusion of RT VBJ just distal to RT PICA.   2  50 to 70 % stenosis of LT MCA distal M1 seg  CTA head and neck 01/29/17 1. No evidence of acute intracranial abnormality. Mild chronic small vessel ischemic disease. 2. No evidence of emergent large vessel occlusion. 3. Unchanged distal right vertebral artery occlusion. 4. Intracranial atherosclerosis without evidence of flow limiting proximal anterior circulation stenosis. 5. Moderate to severe left P2 PCA stenosis. 6. Mild cervical carotid artery atherosclerosis without stenosis.  MRI brain 01/29/17 No acute intracranial process on this motion degraded examination. Old small LEFT cerebellar infarct and mild chronic small vessel ischemic disease Chronic slow flow versus occluded RIGHT V4 segment.  Component     Latest Ref Rng & Units 12/01/2016 12/02/2016  Cholesterol     0 - 200 mg/dL  119  Triglycerides     <150 mg/dL  94  HDL Cholesterol     >40 mg/dL  28 (L)  Total CHOL/HDL Ratio     RATIO  6.5  VLDL     0 - 40 mg/dL  19  LDL (calc)     0 - 99 mg/dL  409 (H)  Hemoglobin W1X     4.8 - 5.6 % 4.9   Mean Plasma Glucose     mg/dL 94     Assessment: As you may recall, he is a 56 y.o. Caucasian male with PMH of smoking and alcohol abuse admitted on 12/01/16 for punctate left  pontine infarct with questionable left posterior temporal punctate infarct. Old small left cerebellar infarct. Received tPA. UDS positive for cocaine. CTA head right VA occlusion. CTA neck unremarkable. 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 EF 60-65%. Negative DVT. LDL 134 and A1C 4.9. He was discharged with DAPT and lipitor. ED visit 01/29/17 with similar symptoms but CTA head and neck as well as MRI not acute changes. UDS again positive for cocaine. Still has 30 day cardiac event monitoring. Still not quit smoking yet. Complains of left knee pain as well as left thigh, left calf and left foot achy pain with tingling in toes. Will increase gabapentin dose.   Plan:  - continue ASA and plavix for another 2 weeks and then plavix alone - continue lipitor for stroke prevention - follow up with 30 day cardiac event monitoring result - check BP at home and record - Follow up with your primary care physician for stroke risk factor modification. Recommend maintain blood pressure goal <130/80, diabetes with hemoglobin A1c goal below 7.0% and lipids with LDL cholesterol goal below 70 mg/dL.  - quit smoking completely - continue abstain from illicit drugs.  - limit alcohol to less than 2 drinks per day.  - will increase gabapentin to  twice a day to help for neuropathy and pain.  - healthy diet and regular exercise  - follow up in 6 months   No orders of the defined types were placed in this encounter.   Meds ordered this encounter  Medications  . DISCONTD: HYDROcodone-acetaminophen (NORCO/VICODIN) 5-325 MG tablet    Sig: Take one tablet twice daily as needed for pain  . gabapentin (NEURONTIN) 300 MG capsule    Sig: Take 1 capsule (300 mg total) by mouth 2 (two) times daily. FOR PAIN OR NEUROPATHY    Dispense:  60 capsule    Refill:  5    Patient Instructions  - continue ASA and plavix for another 2 weeks and then plavix alone - continue lipitor for stroke prevention - follow up  with 30 day cardiac event monitoring result - check BP at home and record - Follow up with your primary care physician for stroke risk factor modification. Recommend maintain blood pressure goal <130/80, diabetes with hemoglobin A1c goal below 7.0% and lipids with LDL cholesterol goal below 70 mg/dL.  - quit smoking completely - continue abstain from illicit drugs.  - limit alcohol to less than 2 drinks per day.  - will increase gabapentin to  twice a day to help for neuropathy and pain.  - healthy diet and regular exercise  - follow up in 6 months    Marvel Plan, MD PhD [facility] [address] [place], [place] [address] [phone]

## 2017-02-18 NOTE — Patient Instructions (Signed)
-   continue ASA and plavix for another 2 weeks and then plavix alone - continue lipitor for stroke prevention - follow up with 30 day cardiac event monitoring result - check BP at home and record - Follow up with your primary care physician for stroke risk factor modification. Recommend maintain blood pressure goal <130/80, diabetes with hemoglobin A1c goal below 7.0% and lipids with LDL cholesterol goal below 70 mg/dL.  - quit smoking completely - continue abstain from illicit drugs.  - limit alcohol to less than 2 drinks per day.  - will increase gabapentin to  twice a day to help for neuropathy and pain.  - healthy diet and regular exercise  - follow up in 6 months

## 2017-02-26 ENCOUNTER — Other Ambulatory Visit: Payer: Self-pay | Admitting: Neurology

## 2017-02-26 ENCOUNTER — Telehealth: Payer: Self-pay | Admitting: Neurology

## 2017-02-26 DIAGNOSIS — I639 Cerebral infarction, unspecified: Secondary | ICD-10-CM

## 2017-02-26 DIAGNOSIS — G629 Polyneuropathy, unspecified: Secondary | ICD-10-CM

## 2017-02-26 DIAGNOSIS — I6350 Cerebral infarction due to unspecified occlusion or stenosis of unspecified cerebral artery: Secondary | ICD-10-CM

## 2017-02-26 MED ORDER — CLOPIDOGREL BISULFATE 75 MG PO TABS: 75.0000 mg | ORAL_TABLET | Freq: Every day | ORAL | 3 refills | Status: DC

## 2017-02-26 MED ORDER — GABAPENTIN 100 MG PO CAPS: 100.0000 mg | ORAL_CAPSULE | Freq: Two times a day (BID) | ORAL | 5 refills | Status: DC

## 2017-02-26 NOTE — Telephone Encounter (Signed)
Please see another phone note  Marvel Plan, MD PhD Stroke Neurology 02/26/2017 6:47 PM

## 2017-02-26 NOTE — Telephone Encounter (Signed)
Patients wife Scio Sink called office in reference to gabapentin (NEURONTIN) 300 MG capsule patient has been dizzy, no energy.  Please call husband back at [phone].

## 2017-02-26 NOTE — Telephone Encounter (Signed)
Pt wife called back to add additional info for the nurse to be aware of. Pt is on plavix, pt has been having head aches. Pt has bruising on stomach, sides and under arm pits. Pt wife said he has not injured himself, she feels it is from the gabapentin, she does not feel it is working. Pt pcp has increased pt pain meds from 1 vicodin to 2 a day

## 2017-02-26 NOTE — Progress Notes (Signed)
Called pt back and he stated that  gabapentin makes him dizzy and not working. I recommend to go back to  bid which he has been on before.   He also has bruise at his body with 5 spots each spots like golf ball size. I told him that probably from plavix side effect. But as long as not other significant bleeding, it will be OK. He asked for for refill plavix.   Marvel Plan, MD PhD Stroke Neurology 02/26/2017 6:45 PM  Meds ordered this encounter  Medications  . gabapentin (NEURONTIN) 100 MG capsule    Sig: Take 1 capsule (100 mg total) by mouth 2 (two) times daily. FOR PAIN OR NEUROPATHY    Dispense:  60 capsule    Refill:  5  . clopidogrel (PLAVIX) 75 MG tablet    Sig: Take 1 tablet (75 mg total) by mouth daily.    Dispense:  90 tablet    Refill:  3

## 2017-03-04 ENCOUNTER — Other Ambulatory Visit: Payer: Self-pay

## 2017-03-04 NOTE — Patient Outreach (Addendum)
Telephone outreach to patient to obtain mRS was successfully completed. mRS = 2 

## 2017-03-20 ENCOUNTER — Emergency Department (HOSPITAL_COMMUNITY)
Admission: EM | Admit: 2017-03-20 | Discharge: 2017-03-20 | Disposition: A | Discharge status: Home / Self Care | Payer: BLUE CROSS/BLUE SHIELD | Attending: Emergency Medicine | Admitting: Emergency Medicine

## 2017-03-20 ENCOUNTER — Encounter (HOSPITAL_COMMUNITY): Payer: Self-pay | Admitting: Emergency Medicine

## 2017-03-20 DIAGNOSIS — Z79899 Other long term (current) drug therapy: Secondary | ICD-10-CM | POA: Insufficient documentation

## 2017-03-20 DIAGNOSIS — T782XXA Anaphylactic shock, unspecified, initial encounter: Secondary | ICD-10-CM

## 2017-03-20 DIAGNOSIS — F17210 Nicotine dependence, cigarettes, uncomplicated: Secondary | ICD-10-CM | POA: Insufficient documentation

## 2017-03-20 DIAGNOSIS — T7840XA Allergy, unspecified, initial encounter: Secondary | ICD-10-CM | POA: Diagnosis present

## 2017-03-20 DIAGNOSIS — Z8673 Personal history of transient ischemic attack (TIA), and cerebral infarction without residual deficits: Secondary | ICD-10-CM | POA: Insufficient documentation

## 2017-03-20 LAB — CBC WITH DIFFERENTIAL/PLATELET
BASOS ABS: 0 10*3/uL (ref 0.0–0.1)
Basophils Relative: 0 %
Eosinophils Absolute: 0.1 10*3/uL (ref 0.0–0.7)
Eosinophils Relative: 0 %
HEMATOCRIT: 43.5 % (ref 39.0–52.0)
Hemoglobin: 14.7 g/dL (ref 13.0–17.0)
LYMPHS ABS: 1.9 10*3/uL (ref 0.7–4.0)
LYMPHS PCT: 11 %
MCH: 31.3 pg (ref 26.0–34.0)
MCHC: 33.8 g/dL (ref 30.0–36.0)
MCV: 92.6 fL (ref 78.0–100.0)
MONO ABS: 0.8 10*3/uL (ref 0.1–1.0)
Monocytes Relative: 4 %
NEUTROS ABS: 15.5 10*3/uL — AB (ref 1.7–7.7)
Neutrophils Relative %: 85 %
Platelets: 207 10*3/uL (ref 150–400)
RBC: 4.7 MIL/uL (ref 4.22–5.81)
RDW: 14.6 % (ref 11.5–15.5)
WBC: 18.3 10*3/uL — ABNORMAL HIGH (ref 4.0–10.5)

## 2017-03-20 LAB — BASIC METABOLIC PANEL
Anion gap: 11 (ref 5–15)
BUN: 20 mg/dL (ref 6–20)
CHLORIDE: 103 mmol/L (ref 101–111)
CO2: 22 mmol/L (ref 22–32)
Calcium: 8.7 mg/dL — ABNORMAL LOW (ref 8.9–10.3)
Creatinine, Ser: 0.66 mg/dL (ref 0.61–1.24)
GFR calc Af Amer: >60 mL/min (ref >60)
GFR calc non Af Amer: >60 mL/min (ref >60)
GLUCOSE: 138 mg/dL — AB (ref 65–99)
POTASSIUM: 4.6 mmol/L (ref 3.5–5.1)
SODIUM: 136 mmol/L (ref 135–145)

## 2017-03-20 LAB — TROPONIN I: Troponin I: <0.03 ng/mL (ref <0.03)

## 2017-03-20 MED ORDER — PREDNISONE 50 MG PO TABS: ORAL_TABLET | ORAL | 0 refills | Status: DC

## 2017-03-20 MED ORDER — METHYLPREDNISOLONE SODIUM SUCC 125 MG IJ SOLR
125.0000 mg | Freq: Once | INTRAMUSCULAR | Status: AC
  Administered 2017-03-20: 125 mg via INTRAVENOUS
  Filled 2017-03-20: qty 2

## 2017-03-20 MED ORDER — EPINEPHRINE 0.3 MG/0.3ML IJ SOAJ
0.3000 mg | Freq: Once | INTRAMUSCULAR | 0 refills | Status: AC
Start: 2017-03-20 — End: 2017-03-20

## 2017-03-20 MED ORDER — FAMOTIDINE IN NACL 20-0.9 MG/50ML-% IV SOLN
20.0000 mg | Freq: Once | INTRAVENOUS | Status: AC
  Administered 2017-03-20: 20 mg via INTRAVENOUS
  Filled 2017-03-20: qty 50

## 2017-03-20 MED ORDER — ONDANSETRON HCL 4 MG/2ML IJ SOLN
4.0000 mg | Freq: Once | INTRAMUSCULAR | Status: AC
  Administered 2017-03-20: 4 mg via INTRAVENOUS
  Filled 2017-03-20: qty 2

## 2017-03-20 MED ORDER — FAMOTIDINE 20 MG PO TABS: 20.0000 mg | ORAL_TABLET | Freq: Two times a day (BID) | ORAL | 0 refills | Status: AC

## 2017-03-20 NOTE — ED Notes (Signed)
Pt departed in NAD, refused use of wheelchair.  

## 2017-03-20 NOTE — Discharge Instructions (Signed)
Take the steroids and antihistamines as prescribed. Use the epinephrine as needed for difficulty breathing or swallowing only. Follow up with your doctor. Return to the ED if you develop new or worsening symptoms.

## 2017-03-20 NOTE — ED Triage Notes (Signed)
Pt BIB EMS from home. Pt "out in woods today". Denies bites or known exposure to anything particular. No recent change in home items. Presented with full body hives, with nausea & diarrhea. No SOB, angioedema. EMS gave 1 epi-pen (by Fire), 50mg  benadryl PO.

## 2017-03-20 NOTE — ED Provider Notes (Signed)
[facility]-EMERGENCY DEPT Provider Note   CSN: [id number] Arrival date & time: 03/20/17  [id number]  By signing my name below, I, Karren CobbleNy'kea Lewis, attest that this documentation has been prepared under the direction and in the presence of Sanjuan Sawa, Jeannett SeniorStephen, MD. Electronically Signed: Karren CobbleNy'kea Lewis, ED Scribe. 03/20/17. 2:36 AM.  History   Chief Complaint No chief complaint on file.  The history is provided by the patient and the EMS personnel. No language interpreter was used.    HPI Comments: Hector Booker is a 56 y.o. male with a PMHx of CVA and HLD, brought in by ambulance, who presents to the Emergency Department complaining of sudden onset, profuse, and moderate itching and hives PTA. His associated system include nausea, chest pain and tightness, abdominal pain, and diarrhea. Pt states he was  awakened from his sleep due to total body itching. Per EMS, pt was given Benadryl 50mg  PO and Epinephrine 0.3 IM.  Notes he is currently on Prednisone but is unsure of the dosage. His last Methotrexate shot was last week. Pt states he was in the woods today but does not recall any contact with any insects or plants. Denies trying new foods recently or changes in soaps, detergents, or lotions. Denies hx of similar episodes. Denies vomiting, dysphagia, or glossitis    Past Medical History:  Diagnosis Date  . Arthritis   . Hyperlipemia   . Stroke [facility](HCC)     Patient Active Problem List   Diagnosis Date Noted  . Neuropathy 02/18/2017  . 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 Alcohol abuse 12/04/2016  . 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 Hyperlipidemia 12/02/2016  . Smoker 12/02/2016  . Cocaine abuse 12/02/2016  . Left pontine stroke [facility](HCC) s/p tPA 12/01/2016    Past Surgical History:  Procedure Laterality Date  . IR GENERIC HISTORICAL  12/02/2016   IR ANGIO VERTEBRAL SEL VERTEBRAL BILAT MOD SED 12/02/2016 Julieanne CottonSanjeev  Deveshwar, MD [facility]-INTER[facility]  . IR GENERIC HISTORICAL  12/02/2016   IR ANGIO INTRA EXTRACRAN SEL COM CAROTID INNOMINATE BILAT MOD SED 12/02/2016 Julieanne CottonSanjeev Deveshwar, MD [facility]       Home Medications    Prior to Admission medications   Medication Sig Start Date End Date Taking? Authorizing Provider  atorvastatin (LIPITOR) 40 MG tablet Take 1 tablet (40 mg total) by mouth daily at 6 PM. 12/04/16   Layne BentonBiby, Sharon L, NP  clopidogrel (PLAVIX) 75 MG tablet Take 1 tablet (75 mg total) by mouth daily. 02/26/17   Marvel PlanXu, Jindong, MD  gabapentin (NEURONTIN) 100 MG capsule Take 1 capsule (100 mg total) by mouth 2 (two) times daily. FOR PAIN OR NEUROPATHY 02/26/17   Marvel PlanXu, Jindong, MD  HYDROcodone-acetaminophen (NORCO/VICODIN) 5-325 MG tablet Take 1 tablet by mouth 2 (two) times daily as needed for pain. for pain 01/23/17   [provider]    Family History Family History  Problem Relation Age of Onset  . Heart attack Father   . Stroke Paternal Grandfather     Social History Social History  Substance Use Topics  . Smoking status: Current Every Day Smoker    Packs/day: 0.25    Types: Cigarettes, Cigars  . Smokeless tobacco: Never Used     Comment: black mild 2 day  . Alcohol use 7.2 oz/week    12 Cans of beer per week     Comment: 1 beer a week     Allergies   Penicillins  Review of Systems Review of Systems  HENT: Negative for trouble swallowing.   Respiratory: Positive for chest tightness and shortness of breath.   Cardiovascular: Positive for chest pain.  Gastrointestinal: Positive for abdominal pain, diarrhea and nausea. Negative for vomiting.  Skin: Positive for rash.    A complete 10 system review of systems was obtained and all systems are negative except as noted in the HPI and PMH.   Physical Exam Updated Vital Signs BP (!) 142/96   Pulse 85   Temp 97.7 F (36.5 C) (Oral)   Resp (!) 26   Ht 5\' 8"  (1.727 m)   Wt 172 lb (78 kg)   SpO2 97%   BMI 26.15 kg/m    Physical Exam  Constitutional: He is oriented to person, place, and time. He appears well-developed and well-nourished. No distress.  HENT:  Head: Normocephalic and atraumatic.  Mouth/Throat: Oropharynx is clear and moist. No oropharyngeal exudate.  No tongue or lip swelling.   Eyes: Conjunctivae and EOM are normal. Pupils are equal, round, and reactive to light.  Neck: Normal range of motion. Neck supple.  No meningismus.  Cardiovascular: Normal rate, regular rhythm, normal heart sounds and intact distal pulses.   No murmur heard. Pulmonary/Chest: Effort normal and breath sounds normal. No respiratory distress. He has no wheezes.  Abdominal: Soft. There is no tenderness. There is no rebound and no guarding.  Musculoskeletal: Normal range of motion. He exhibits no edema or tenderness.  Neurological: He is alert and oriented to person, place, and time. No cranial nerve deficit. He exhibits normal muscle tone. Coordination normal.  L sided weakness at baseline. CN 2-12 intact.Equal grip strength.   Skin: Skin is warm. Rash noted.  Diffuse urticaria.     Psychiatric: He has a normal mood and affect. His behavior is normal.  Nursing note and vitals reviewed.    ED Treatments / Results  DIAGNOSTIC STUDIES: Oxygen Saturation is 97% on RAA, normal by my interpretation.   COORDINATION OF CARE: 2:45 AM-Discussed next steps with pt. Pt verbalized understanding and is agreeable with the plan.  Labs (all labs ordered are listed, but only abnormal results are displayed) Labs Reviewed  BASIC METABOLIC PANEL - Abnormal; Notable for the following:       Result Value   Glucose, Bld 138 (*)    Calcium 8.7 (*)    All other components within normal limits  CBC WITH DIFFERENTIAL/PLATELET - Abnormal; Notable for the following:    WBC 18.3 (*)    Neutro Abs 15.5 (*)    All other components within normal limits  TROPONIN I  TROPONIN I  CBC WITH DIFFERENTIAL/PLATELET    EKG  EKG  Interpretation  Date/Time:  Friday Mar 20 2017 03:01:11 EDT Ventricular Rate:  68 PR Interval:    QRS Duration: 88 QT Interval:  407 QTC Calculation: 433 R Axis:   67 Text Interpretation:  Sinus rhythm Probable left atrial enlargement No significant change was found Confirmed by Glynn Octave [phone]) on 03/20/2017 3:15:24 AM Also confirmed by Glynn Octave [phone]), editor Jac Canavan, [place] ([id number])  on 03/20/2017 6:57:20 AM       Radiology No results found.  Procedures Procedures (including critical care time)  Medications Ordered in ED Medications - No data to display   Initial Impression / Assessment and Plan / ED Course  I have reviewed the triage vital signs and the nursing notes.  Pertinent labs & imaging results that were available during my care of the patient  were reviewed by me and considered in my medical decision making (see chart for details).     Patient with allergic reaction to unknown allergen. Brought in by EMS received IM epinephrine as well as Benadryl. Had full body hives with nausea and diarrhea. No shortness of breath, tongue or lip swelling.  Patient with anaphylaxis to uncertain allergen. He denies any difficulty breathing or difficulty swallowing. Had brief episode of brief chest pain earlier which has resolved.  EKG is normal sinus rhythm. Troponin is negative.  Patient given antihistamines, steroids. Received IM epinephrine by EMS. He has been taking 5 mg of prednisone for rheumatoid arthritis over the past several weeks.  Recheck at 7 am.  Patient feels back to baseline. No CP or SOB.  Rash has resolved.  No tongue swelling. Troponin negative x2. Chest pain resolved.   Discharge with antihistamines and steroids. Epi pen as needed for severe allergic reaction. Followup with PCP. Return precautions discussed.   Marland KitchenCRITICAL CARE Performed by: Glynn Octave Total critical care time: 35 minutes Critical care time was exclusive of separately  billable procedures and treating other patients. Critical care was necessary to treat or prevent imminent or life-threatening deterioration. Critical care was time spent personally by me on the following activities: development of treatment plan with patient and/or surrogate as well as nursing, discussions with consultants, evaluation of patient's response to treatment, examination of patient, obtaining history from patient or surrogate, ordering and performing treatments and interventions, ordering and review of laboratory studies, ordering and review of radiographic studies, pulse oximetry and re-evaluation of patient's condition.    Final Clinical Impressions(s) / ED Diagnoses   Final diagnoses:  Anaphylaxis, initial encounter    New Prescriptions New Prescriptions   No medications on file   I personally performed the services described in this documentation, which was scribed in my presence. The recorded information has been reviewed and is accurate.    Glynn Octave, MD 03/20/17 [phone]

## 2017-04-02 NOTE — Progress Notes (Signed)
Clearance form fax to [facility] [place] for orthopedic procedure. Fax confirm and sent.

## 2017-04-20 DIAGNOSIS — Z0271 Encounter for disability determination: Secondary | ICD-10-CM

## 2017-04-23 ENCOUNTER — Ambulatory Visit: Payer: BLUE CROSS/BLUE SHIELD | Admitting: Nurse Practitioner

## 2017-04-29 ENCOUNTER — Telehealth: Payer: Self-pay | Admitting: Neurology

## 2017-04-29 NOTE — Telephone Encounter (Signed)
Rn spoke with Dr. Pearlean BrownieSethi via telephone. He can see pt for Dr. Pearlean BrownieSethi next week for shaking issues.

## 2017-04-29 NOTE — Telephone Encounter (Signed)
Rn call patients wife about her husband shaking for about a week. Rn ask patient about the new symptoms and onset. PTs wife stated she has seen it 3 to 4 times in the last week and the shaking last 15 minutes. It happens when patient is sitting down. The wife stated when the episode happens he does not lose conscious and is alert and oriented x4. Rn ask pts wife did her husband have any falls, car accidents, take any otc medicine or do any illicit drugs. Pts wife stated' I know he has not done any drugs I know that for a fact". Rn ask patients wife about her husband alcohol intake. She take " Dr. Roda ShuttersXu told us he can have 2 beers per day.". She reported he is drinking 2 beers a day. RN explain to wife Dr. Roda ShuttersXu does not have any available appts in for the month of July. Rn stated per Dr. Roda ShuttersXu he can see Dr. Pearlean BrownieSethi for the shakiness. Rn stated Dr. Pearlean BrownieSEthi will see him. Pt schedule with Dr.Sethi.on 05/07/2017 at 130pm. Appt verified with wife.

## 2017-04-29 NOTE — Telephone Encounter (Signed)
Patient's wife calling stating patient has been shaking for about a week and thinks he needs to be seen. Please call and discuss.

## 2017-04-29 NOTE — Telephone Encounter (Signed)
Wife verify patient did not have any falls, or car accidents or take any OTC medications bedside tylenol.

## 2017-04-29 NOTE — Telephone Encounter (Signed)
My schedule is full. I saw Dr. Pearlean BrownieSethi next week Tuesday or Wednesday 1:30pm has slot. Please schedule him to see Dr. Pearlean BrownieSethi if possible. Thanks.   Marvel PlanJindong Wood Novacek, MD PhD Stroke Neurology 04/29/2017 1:17 PM

## 2017-05-07 ENCOUNTER — Ambulatory Visit (INDEPENDENT_AMBULATORY_CARE_PROVIDER_SITE_OTHER): Payer: BLUE CROSS/BLUE SHIELD | Admitting: Neurology

## 2017-05-07 ENCOUNTER — Encounter: Payer: Self-pay | Admitting: Neurology

## 2017-05-07 ENCOUNTER — Telehealth: Payer: Self-pay

## 2017-05-07 VITALS — BP 115/78 | HR 94 | Wt 176.0 lb

## 2017-05-07 DIAGNOSIS — R251 Tremor, unspecified: Secondary | ICD-10-CM

## 2017-05-07 NOTE — Progress Notes (Signed)
STROKE NEUROLOGY FOLLOW UP NOTE  NAME: TAYLOR LEVICK DOB: [birth year]  REASON FOR VISIT: stroke follow up HISTORY FROM: pt and chart  Today we had the pleasure of seeing Hector Booker in follow-up at our Neurology Clinic. Pt was accompanied by no one.   History Summary Mr.Hector C Hodgesis a 56 y.o.malewith history of smoking and alcohol abuse presenting with headache with vertigo, left arm numbness and weakness and right facial droop. Exam showed unsustained rotational nystagmus on upward gaze. HereceivedIV t-PA. MRI showed punctate left pontine infarct with questionable left posterior temporal punctate infarct. Old small left cerebellar infarct. UDS positive for cocaine. CTA head right VA occlusion. CTA neck unremarkable. 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 EF 60-65%. Negative DVT. LDL 134 and A1C 4.9. He was discharged with DAPT and lipitor. Recommend 30 day cardiac monitoring.  12/24/16 follow up - the patient has been doing well. Currently has 30 day cardiac event monitoring. On DAPT and lipitor, no side effects. Still not quit smoking yet, but cut down a lot. Plan to quit soon. Denies any illicit drugs since discharge. Occasional HA, tylenol helps. BP today 120/75.  Offfice visit 02/18/17 ( Dr Roda Shutters) Pt had ED visit on 01/29/17 with recurrent symptoms of dizziness, left arm weakness, and right-sided facial droop. CT scan showed no evidence of acute intracranial abnormality. CTA head and neck no LVO and unchanged right VA occlusion. MRI no acute stroke. Old left cerebellar infarct was seen. UDS positive for cocaine. His condition was considered as recrudescence of old stroke or cocaine related vasospasm. He was discharged from ED in good condition and continued on DAPT and lipitor.   During the interval time, he had no complains. Willing to quit cigar and cocaine. BP today 123/79. Complains of left knee pain as well as left thigh, left calf and left foot achy pain with  tingling in toes. Will increase gabapentin dose.     Update 05/07/2017 :  The patient is seen urgently today upon request from him and his wife complaining about new onset of left hand tremors since the last 2 months. He states the tremors off and on and present mostly at rest and involved only his left hand. He can stop the tremor voluntarily if he focuses on and off. The tremors are quite mild and do not interfere with his activities of daily living. He can hold a cup of water and cut his food and button his clothes without any problems. Patient is anxious increased left knee pain and plans to have knee replacement surgery in the fall. He does have some residual breast tissue is in the left leg from his stroke as well as some weakness in his hand but this is unchanged. He has cut back drinking alcohol to 2 drinks a day. He states he does no longer does cocaine. He has no family history of tremors. He denies any drooling of saliva, bradykinesia or falls or injuries. REVIEW OF SYSTEMS: Full 14 system review of systems performed and notable only for those listed below and in HPI above, all others are negative:   Joint pain, neck stiffness, headache, numbness, tremors and all other systems negative  The following represents the patient's updated allergies and side effects list: Allergies  Allergen Reactions  . Penicillins Anaphylaxis    Has patient had a PCN reaction causing immediate rash, facial/tongue/throat swelling, SOB or lightheadedness with hypotension: Yes Has patient had a PCN reaction causing severe rash involving mucus membranes or skin  necrosis: No Has patient had a PCN reaction that required hospitalization Yes Has patient had a PCN reaction occurring within the last 10 years: No If all of the above answers are "NO", then may proceed with Cephalosporin use.     The neurologically relevant items on the patient's problem list were reviewed on today's visit.  Neurologic Examination  A  problem focused neurological exam (12 or more points of the single system neurologic examination, vital signs counts as 1 point, cranial nerves count for 8 points) was performed.  Blood pressure 115/78, pulse 94, weight 176 lb (79.8 kg).  General - Well nourished, well developed, in no apparent distress.  Ophthalmologic - Sharp disc margins OU.   Cardiovascular - Regular rate and rhythm with no murmur.  Mental Status -  Level of arousal and orientation to time, place, and person were intact. Language including expression, naming, repetition, comprehension was assessed and found intact Fund of Knowledge was assessed and was intact.  Cranial Nerves II - XII - II - Visual field intact OU. III, IV, VI - Extraocular movements intact. V - Facial sensation intact bilaterally. VII - Facial movement intact bilaterally. VIII - Hearing & vestibular intact bilaterally, no more nystagmus. X - Palate elevates symmetrically. XI - Chin turning & shoulder shrug intact bilaterally. XII - Tongue protrusion intact.  Motor Strength - The patient's strength was normal in all extremities and pronator drift was absent.  Bulk was normal and fasciculations were absent.  Diminished fine finger movements on the left. Orbits right over left upper extremity.  No resting or action tremor. No cogwheel rigidity. No bradykinesia. Can write easily from the chair with arms folded across his chest.  Reflexes - The patient's reflexes were 1+ in all extremities and he had no pathological reflexes.  Sensory - Light touch, temperature/pinprick were assessed and were normal   Coordination - The patient had normal movements in the hands and feet with no ataxia or dysmetria.  Tremor was absent.  Gait and Station - The patient's transfers, posture, gait, station, and turns were observed as normal. Diminished left arm swing when he walks but no pill-rolling tremor noted Poor response to threat with some retropulsion but does  not fall   Data reviewed: I personally reviewed the images and agree with the radiology interpretations.  Ct Head Code Stroke W/o Cm 12/01/2016 1. No acute intracranial process. Mild chronic small vessel ischemic disease. 2. ASPECTS is 10.   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 Mild - moderate luminal irregularity of the intracranial vessels compatible with atherosclerosis.   CTA neck 12/02/2016 Luminal irregularity RIGHT vertebral artery compatible with age indeterminate injury, without flow limiting stenosis. No hemodynamically significant stenosis of the carotid arteries. Asymmetric fullness RIGHT vocal cord, recommend direct inspection on nonemergent basis. Degenerative cervical spine resulting in multilevel severe neural foraminal narrowing.   Mr Brain Wo Contrast 12/02/2016 Punctate LEFT pontine infarct versus artifact. RIGHT V4 segment occlusion. Old small LEFT cerebellar infarct and mild chronic small vessel ischemic disease. Left posterior temporal punctate DWI changes.   TTE Left ventricle: The cavity size was normal. Systolic function was normal. The estimated ejection fraction was in the range of 60%to 65%. Wall motion was normal; there were no regional wallmotion abnormalities. Left ventricular diastolic functionparameters were normal.  LE venous doppler  No evidence of deep vein thrombosis or baker's cysts bilaterally.  Cerebral angio  1  Near complete occlusion of RT VBJ just distal to RT  PICA.   2  50 to 70 % stenosis of LT MCA distal M1 seg  CTA head and neck 01/29/17 1. No evidence of acute intracranial abnormality. Mild chronic small vessel ischemic disease. 2. No evidence of emergent large vessel occlusion. 3. Unchanged distal right vertebral artery occlusion. 4. Intracranial atherosclerosis without evidence of flow limiting proximal anterior circulation stenosis. 5. Moderate to severe left P2 PCA  stenosis. 6. Mild cervical carotid artery atherosclerosis without stenosis.  MRI brain 01/29/17 No acute intracranial process on this motion degraded examination. Old small LEFT cerebellar infarct and mild chronic small vessel ischemic disease Chronic slow flow versus occluded RIGHT V4 segment.  Component     Latest Ref Rng & Units 12/01/2016 12/02/2016  Cholesterol     0 - 200 mg/dL  086181  Triglycerides     <150 mg/dL  94  HDL Cholesterol     >40 mg/dL  28 (L)  Total CHOL/HDL Ratio     RATIO  6.5  VLDL     0 - 40 mg/dL  19  LDL (calc)     0 - 99 mg/dL  578134 (H)  Hemoglobin [id number]     4.8 - 5.6 % 4.9   Mean Plasma Glucose     mg/dL 94     Assessment: As you may recall, he is a 56 y.o. Caucasian male with PMH of smoking and alcohol abuse admitted on 12/01/16 for punctate left pontine infarct with questionable left posterior temporal punctate infarct. Old small left cerebellar infarct. Received tPA. UDS positive for cocaine. CTA head right VA occlusion. CTA neck unremarkable. 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  New complaints of intermittent resting left hand tremor which was not evident in today's exam. No parkinsonian features on exam. Etiology of tremor indeterminate possible anxiety versus alcohol related  Plan:  I had a long discussion the patient and wife regarding his new complaints of intermittent left hand tremor which appears to be of unclear etiology. The tremor is not disabling or interfering with his daily activities hence we will hold off on any specific medications. Check complete metabolic panel, TSH, B12 and urine drug screen. I advised the patient to continue Plavix for stroke prevention and maintain strict control of hypertension with blood pressure goal below 130/90 and lipids with LDL cholesterol goal below 70 mg percent. He was also counseled to quit alcohol as well as cocaine. He will continue follow-up with Dr. Roda ShuttersXu in the future as needed. Greater than  50% time during this 30 minute visit was spent on counseling and coordination of care about his tremors, recent stroke, secondary stroke prevention and answered questions Orders Placed This Encounter  Procedures  . CMP  . TSH  . Vitamin B12  . Drug Screen, Urine    No orders of the defined types were placed in this encounter.       Delia HeadyPramod Ronasia Isola, MD [facility] [address], [address] [place], [place] [address] [phone]

## 2017-05-07 NOTE — Patient Instructions (Signed)
I had a long discussion the patient and wife regarding his new complaints of intermittent left hand tremor which appears to be of unclear etiology. The tremor is not disabling or interfering with his daily activities hence we will hold off on any specific medications. Check complete metabolic panel, TSH, B12 and urine drug screen. I advised the patient to continue Plavix for stroke prevention and maintain strict control of hypertension with blood pressure goal below 130/90 and lipids with LDL cholesterol goal below 70 mg percent. He was also counseled to quit alcohol as well as cocaine. He will continue follow-up with Dr. Roda ShuttersXu in the future as needed.

## 2017-05-07 NOTE — Telephone Encounter (Signed)
If patient or wife calls back lab work needs to be done asap.

## 2017-06-18 ENCOUNTER — Other Ambulatory Visit: Payer: Self-pay | Admitting: Orthopedic Surgery

## 2017-08-17 ENCOUNTER — Ambulatory Visit: Admit: 2017-08-17 | Payer: BLUE CROSS/BLUE SHIELD | Admitting: Orthopedic Surgery

## 2017-08-17 SURGERY — ARTHROPLASTY, KNEE, TOTAL
Anesthesia: Spinal | Site: Knee | Laterality: Left

## 2017-08-20 ENCOUNTER — Ambulatory Visit: Payer: BLUE CROSS/BLUE SHIELD | Admitting: Nurse Practitioner

## 2017-08-31 ENCOUNTER — Ambulatory Visit (INDEPENDENT_AMBULATORY_CARE_PROVIDER_SITE_OTHER): Payer: BLUE CROSS/BLUE SHIELD | Admitting: Neurology

## 2017-08-31 ENCOUNTER — Encounter: Payer: Self-pay | Admitting: Neurology

## 2017-08-31 VITALS — BP 157/97 | HR 74 | Wt 191.5 lb

## 2017-08-31 DIAGNOSIS — F17200 Nicotine dependence, unspecified, uncomplicated: Secondary | ICD-10-CM | POA: Diagnosis not present

## 2017-08-31 DIAGNOSIS — F1011 Alcohol abuse, in remission: Secondary | ICD-10-CM

## 2017-08-31 DIAGNOSIS — E785 Hyperlipidemia, unspecified: Secondary | ICD-10-CM

## 2017-08-31 DIAGNOSIS — I63211 Cerebral infarction due to unspecified occlusion or stenosis of right vertebral arteries: Secondary | ICD-10-CM | POA: Diagnosis not present

## 2017-08-31 DIAGNOSIS — I10 Essential (primary) hypertension: Secondary | ICD-10-CM | POA: Diagnosis not present

## 2017-08-31 DIAGNOSIS — F1491 Cocaine use, unspecified, in remission: Secondary | ICD-10-CM | POA: Insufficient documentation

## 2017-08-31 DIAGNOSIS — Z87898 Personal history of other specified conditions: Secondary | ICD-10-CM | POA: Diagnosis not present

## 2017-08-31 NOTE — Progress Notes (Signed)
STROKE NEUROLOGY FOLLOW UP NOTE  NAME: TOM MACPHERSON DOB: [birth year]  REASON FOR VISIT: stroke follow up HISTORY FROM: pt and chart  Today we had the pleasure of seeing ZORIAN GUNDERMAN in follow-up at our Neurology Clinic. Pt was accompanied by no one.   History Summary Mr.Neno C Hodgesis a 56 y.o.malewith history of smoking and alcohol abuse presenting with headache with vertigo, left arm numbness and weakness and right facial droop. Exam showed unsustained rotational nystagmus on upward gaze. HereceivedIV t-PA. MRI showed punctate left pontine infarct with questionable left posterior temporal punctate infarct. Old small left cerebellar infarct. UDS positive for cocaine. CTA head right VA occlusion. CTA neck unremarkable. 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 EF 60-65%. Negative DVT. LDL 134 and A1C 4.9. He was discharged with DAPT and lipitor. Recommend 30 day cardiac monitoring.  12/24/16 follow up - the patient has been doing well. Currently has 30 day cardiac event monitoring. On DAPT and lipitor, no side effects. Still not quit smoking yet, but cut down a lot. Plan to quit soon. Denies any illicit drugs since discharge. Occasional HA, tylenol helps. BP today 120/75.  ED visit on 01/29/17 with recurrent symptoms of dizziness, left arm weakness, and right-sided facial droop. CT scan showed no evidence of acute intracranial abnormality. CTA head and neck no LVO and unchanged right VA occlusion. MRI no acute stroke. Old left cerebellar infarct was seen. UDS positive for cocaine. His condition was considered as recrudescence of old stroke or cocaine related vasospasm. He was discharged from ED in good condition and continued on DAPT and lipitor.   02/18/17 follow up - During the interval time, he had no complains. Willing to quit cigar and cocaine. BP today 123/79. Complains of left knee pain as well as left thigh, left calf and left foot achy pain with tingling in toes. Will  increase gabapentin dose.   05/07/17 follow up (PS) - The patient is seen urgently today upon request from him and his wife complaining about new onset of left hand tremors since the last 2 months. He states the tremors off and on and present mostly at rest and involved only his left hand. He can stop the tremor voluntarily if he focuses on and off. The tremors are quite mild and do not interfere with his activities of daily living. He can hold a cup of water and cut his food and button his clothes without any problems. Patient is anxious increased left knee pain and plans to have knee replacement surgery in the fall. He does have some residual breast tissue is in the left leg from his stroke as well as some weakness in his hand but this is unchanged. He has cut back drinking alcohol to 2 drinks a day. He states he does no longer does cocaine. He has no family history of tremors. He denies any drooling of saliva, bradykinesia or falls or injuries.  Interval History During the interval time, he has been doing well. He was seen on 05/07/17 for left hand tremor but no tremor during the visit. His tremor was considered to related to anxiety or alcohol use. He has very limited alcohol now and probably 2 beers per week. And his tremor has been resolved. He still not quit smoking yet, but cut back a lot and now on chantix. BP at home was good, but today 157/97 in clinic. His left sided numbness under control with gabapentin 100mg  bid. He is going to have left knee  replacement earlier next year. He was diagnosed with RA 2 months ago and now on MTX and prednisone. Complains of weight gain. Otherwise no complains.   REVIEW OF SYSTEMS: Full 14 system review of systems performed and notable only for those listed below and in HPI above, all others are negative:  Constitutional:   Cardiovascular:  Ear/Nose/Throat:   Skin:  Eyes:   Respiratory:   Gastroitestinal:   Genitourinary:  Hematology/Lymphatic:   Endocrine:    Musculoskeletal:   Allergy/Immunology:  Neurological:  numbness Psychiatric:  Sleep:   The following represents the patient's updated allergies and side effects list: Allergies  Allergen Reactions  . Penicillins Anaphylaxis    Has patient had a PCN reaction causing immediate rash, facial/tongue/throat swelling, SOB or lightheadedness with hypotension: Yes Has patient had a PCN reaction causing severe rash involving mucus membranes or skin necrosis: No Has patient had a PCN reaction that required hospitalization Yes Has patient had a PCN reaction occurring within the last 10 years: No If all of the above answers are "NO", then may proceed with Cephalosporin use.     The neurologically relevant items on the patient's problem list were reviewed on today's visit.  Neurologic Examination  A problem focused neurological exam (12 or more points of the single system neurologic examination, vital signs counts as 1 point, cranial nerves count for 8 points) was performed.  Blood pressure (!) 157/97, pulse 74, weight 191 lb 8 oz (86.9 kg).  General - Well nourished, well developed, in no apparent distress.  Ophthalmologic - Sharp disc margins OU.   Cardiovascular - Regular rate and rhythm with no murmur.  Mental Status -  Level of arousal and orientation to time, place, and person were intact. Language including expression, naming, repetition, comprehension was assessed and found intact Fund of Knowledge was assessed and was intact.  Cranial Nerves II - XII - II - Visual field intact OU. III, IV, VI - Extraocular movements intact. V - Facial sensation intact bilaterally. VII - Facial movement intact bilaterally. VIII - Hearing & vestibular intact bilaterally, no more nystagmus. X - Palate elevates symmetrically. XI - Chin turning & shoulder shrug intact bilaterally. XII - Tongue protrusion intact.  Motor Strength - The patient's strength was normal in all extremities and pronator  drift was absent.  Bulk was normal and fasciculations were absent.   Motor Tone - Muscle tone was assessed at the neck and appendages and was normal.  Reflexes - The patient's reflexes were 1+ in all extremities and he had no pathological reflexes.  Sensory - Light touch, temperature/pinprick were assessed and were normal   Coordination - The patient had normal movements in the hands and feet with no ataxia or dysmetria.  Tremor was absent.  Gait and Station - The patient's transfers, posture, gait, station, and turns were observed as normal.   Data reviewed: I personally reviewed the images and agree with the radiology interpretations.  Ct Head Code Stroke W/o Cm 12/01/2016 1. No acute intracranial process. Mild chronic small vessel ischemic disease. 2. ASPECTS is 10.   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 Mild - moderate luminal irregularity of the intracranial vessels compatible with atherosclerosis.   CTA neck 12/02/2016 Luminal irregularity RIGHT vertebral artery compatible with age indeterminate injury, without flow limiting stenosis. No hemodynamically significant stenosis of the carotid arteries. Asymmetric fullness RIGHT vocal cord, recommend direct inspection on nonemergent basis. Degenerative cervical spine resulting in multilevel severe neural foraminal narrowing.  Mr Brain Wo Contrast 12/02/2016 Punctate LEFT pontine infarct versus artifact. RIGHT V4 segment occlusion. Old small LEFT cerebellar infarct and mild chronic small vessel ischemic disease. Left posterior temporal punctate DWI changes.   TTE Left ventricle: The cavity size was normal. Systolic function was normal. The estimated ejection fraction was in the range of 60%to 65%. Wall motion was normal; there were no regional wallmotion abnormalities. Left ventricular diastolic functionparameters were normal.  LE venous doppler  No evidence of deep vein thrombosis  or baker's cysts bilaterally.  Cerebral angio  1  Near complete occlusion of RT VBJ just distal to RT PICA.   2  50 to 70 % stenosis of LT MCA distal M1 seg  CTA head and neck 01/29/17 1. No evidence of acute intracranial abnormality. Mild chronic small vessel ischemic disease. 2. No evidence of emergent large vessel occlusion. 3. Unchanged distal right vertebral artery occlusion. 4. Intracranial atherosclerosis without evidence of flow limiting proximal anterior circulation stenosis. 5. Moderate to severe left P2 PCA stenosis. 6. Mild cervical carotid artery atherosclerosis without stenosis.  MRI brain 01/29/17 No acute intracranial process on this motion degraded examination. Old small LEFT cerebellar infarct and mild chronic small vessel ischemic disease Chronic slow flow versus occluded RIGHT V4 segment.  Component     Latest Ref Rng & Units 12/01/2016 12/02/2016  Cholesterol     0 - 200 mg/dL  161  Triglycerides     <150 mg/dL  94  HDL Cholesterol     >40 mg/dL  28 (L)  Total CHOL/HDL Ratio     RATIO  6.5  VLDL     0 - 40 mg/dL  19  LDL (calc)     0 - 99 mg/dL  096 (H)  Hemoglobin E4V     4.8 - 5.6 % 4.9   Mean Plasma Glucose     mg/dL 94     Assessment: As you may recall, he is a 56 y.o. Caucasian male with PMH of smoking and alcohol abuse admitted on 12/01/16 for punctate left pontine infarct with questionable left posterior temporal punctate infarct. Old small left cerebellar infarct. Received tPA. UDS positive for cocaine. CTA head right VA occlusion. CTA neck unremarkable. 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 EF 60-65%. Negative DVT. LDL 134 and A1C 4.9. He was discharged with DAPT and lipitor. ED visit 01/29/17 with similar symptoms but CTA head and neck as well as MRI not acute changes. UDS again positive for cocaine. Had 30 day cardiac event monitoring which showed no afib. Still not quit smoking yet but on chantix now. Alcohol has been limited. Left  hand mild tremor resolved. Complains of left knee pain and will have left knee replacement 11/2017. Still has left sided numbness, on gabapentin, under control. Diagnosed with RA and now on MTX and prednisone.   Plan:  - continue plavix and lipitor for stroke prevention - check BP at home and record - Follow up with your primary care physician for stroke risk factor modification. Recommend maintain blood pressure goal <130/80, diabetes with hemoglobin A1c goal below 7.0% and lipids with LDL cholesterol goal below 70 mg/dL.  - quit smoking completely - continue abstain from illicit drugs.  - limit alcohol use and continue gabapentin for numbness - OK to have left knee replacement. If you have to hold plavix for 5 days, please take ASA 81mg  during the 5 days without plavix.  - healthy diet and regular exercise  - follow up in 6  months   No orders of the defined types were placed in this encounter.   Meds ordered this encounter  Medications  . DISCONTD: HYDROcodone-acetaminophen (NORCO) 10-325 MG tablet    Sig: Take 1/2 to one tablet up to 4 times a day as needed for hand pain.  . Golimumab (SIMPONI ARIA IV)    Sig: Inject into the vein.    There are no Patient Instructions on file for this visit.  Marvel PlanJindong Kamariyah Timberlake, MD PhD [facility] [address], [address] [place], [place] [address] [phone]

## 2017-08-31 NOTE — Patient Instructions (Signed)
-   continue plavix and lipitor for stroke prevention - check BP at home and record - Follow up with your primary care physician for stroke risk factor modification. Recommend maintain blood pressure goal <130/80, diabetes with hemoglobin A1c goal below 7.0% and lipids with LDL cholesterol goal below 70 mg/dL.  - quit smoking completely - continue abstain from illicit drugs.  - limit alcohol use and continue gabapentin for numbness - OK to have left knee replacement. If you have to hold plavix for 5 days, please take ASA 81mg  during the 5 days without plavix.  - healthy diet and regular exercise  - follow up in 6 months

## 2017-10-28 ENCOUNTER — Other Ambulatory Visit: Payer: Self-pay | Admitting: Neurology

## 2017-10-28 DIAGNOSIS — G629 Polyneuropathy, unspecified: Secondary | ICD-10-CM

## 2017-10-29 MED ORDER — GABAPENTIN 100 MG PO CAPS: ORAL_CAPSULE | ORAL | 0 refills | Status: DC

## 2017-10-29 NOTE — Telephone Encounter (Signed)
One month refill of gabapentin given. Patient needs FU for further refills.

## 2017-12-11 ENCOUNTER — Other Ambulatory Visit: Payer: Self-pay | Admitting: Neurology

## 2017-12-11 DIAGNOSIS — G629 Polyneuropathy, unspecified: Secondary | ICD-10-CM

## 2017-12-15 ENCOUNTER — Other Ambulatory Visit: Payer: Self-pay

## 2017-12-15 DIAGNOSIS — G629 Polyneuropathy, unspecified: Secondary | ICD-10-CM

## 2017-12-15 MED ORDER — GABAPENTIN 100 MG PO CAPS: ORAL_CAPSULE | ORAL | 3 refills | Status: DC

## 2018-01-05 ENCOUNTER — Other Ambulatory Visit: Payer: Self-pay | Admitting: Physician Assistant

## 2018-01-05 DIAGNOSIS — R5381 Other malaise: Secondary | ICD-10-CM

## 2018-02-16 ENCOUNTER — Ambulatory Visit: Payer: BLUE CROSS/BLUE SHIELD | Admitting: Neurology

## 2018-02-16 ENCOUNTER — Ambulatory Visit (INDEPENDENT_AMBULATORY_CARE_PROVIDER_SITE_OTHER): Payer: BLUE CROSS/BLUE SHIELD | Admitting: Neurology

## 2018-02-16 ENCOUNTER — Encounter: Payer: Self-pay | Admitting: Neurology

## 2018-02-16 VITALS — BP 130/86 | HR 83 | Ht 70.0 in | Wt 201.4 lb

## 2018-02-16 DIAGNOSIS — F17200 Nicotine dependence, unspecified, uncomplicated: Secondary | ICD-10-CM

## 2018-02-16 DIAGNOSIS — Z87898 Personal history of other specified conditions: Secondary | ICD-10-CM | POA: Diagnosis not present

## 2018-02-16 DIAGNOSIS — I63211 Cerebral infarction due to unspecified occlusion or stenosis of right vertebral arteries: Secondary | ICD-10-CM | POA: Diagnosis not present

## 2018-02-16 DIAGNOSIS — I10 Essential (primary) hypertension: Secondary | ICD-10-CM

## 2018-02-16 DIAGNOSIS — F1491 Cocaine use, unspecified, in remission: Secondary | ICD-10-CM

## 2018-02-16 DIAGNOSIS — E785 Hyperlipidemia, unspecified: Secondary | ICD-10-CM | POA: Diagnosis not present

## 2018-02-16 DIAGNOSIS — I6350 Cerebral infarction due to unspecified occlusion or stenosis of unspecified cerebral artery: Secondary | ICD-10-CM

## 2018-02-16 DIAGNOSIS — I639 Cerebral infarction, unspecified: Secondary | ICD-10-CM

## 2018-02-16 DIAGNOSIS — F1011 Alcohol abuse, in remission: Secondary | ICD-10-CM

## 2018-02-16 NOTE — Progress Notes (Signed)
STROKE NEUROLOGY FOLLOW UP NOTE  NAME: Hector Booker DOB: 03/07/1961  REASON FOR VISIT: stroke follow up HISTORY FROM: pt and chart  Today we had the pleasure of seeing Hector Booker in follow-up at our Neurology Clinic. Pt was accompanied by no one.   History Summary Hector Booker a 56 y.o.malewith history of smoking and alcohol abuse presenting with headache with vertigo, left arm numbness and weakness and right facial droop. Exam showed unsustained rotational nystagmus on upward gaze. HereceivedIV t-PA. MRI showed punctate left pontine infarct with questionable left posterior temporal punctate infarct. Old small left cerebellar infarct. UDS positive for cocaine. CTA head right VA occlusion. CTA neck unremarkable. 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 EF 60-65%. Negative DVT. LDL 134 and A1C 4.9. He was discharged with DAPT and lipitor. Recommend 30 day cardiac monitoring.  12/24/16 follow up - the patient has been doing well. Currently has 30 day cardiac event monitoring. On DAPT and lipitor, no side effects. Still not quit smoking yet, but cut down a lot. Plan to quit soon. Denies any illicit drugs since discharge. Occasional HA, tylenol helps. BP today 120/75.  ED visit on 01/29/17 with recurrent symptoms of dizziness, left arm weakness, and right-sided facial droop. CT scan showed no evidence of acute intracranial abnormality. CTA head and neck no LVO and unchanged right VA occlusion. MRI no acute stroke. Old left cerebellar infarct was seen. UDS positive for cocaine. His condition was considered as recrudescence of old stroke or cocaine related vasospasm. He was discharged from ED in good condition and continued on DAPT and lipitor.   02/18/17 follow up - During the interval time, he had no complains. Willing to quit cigar and cocaine. BP today 123/79. Complains of left knee pain as well as left thigh, left calf and left foot achy pain with tingling in toes. Will  increase gabapentin dose.   05/07/17 follow up (PS) - The patient is seen urgently today upon request from him and his wife complaining about new onset of left hand tremors since the last 2 months. He states the tremors off and on and present mostly at rest and involved only his left hand. He can stop the tremor voluntarily if he focuses on and off. The tremors are quite mild and do not interfere with his activities of daily living. He can hold a cup of water and cut his food and button his clothes without any problems. Patient is anxious increased left knee pain and plans to have knee replacement surgery in the fall. He does have some residual breast tissue is in the left leg from his stroke as well as some weakness in his hand but this is unchanged. He has cut back drinking alcohol to 2 drinks a day. He states he does no longer does cocaine. He has no family history of tremors. He denies any drooling of saliva, bradykinesia or falls or injuries.  08/31/17 follow up - he has been doing well. He was seen on 05/07/17 for left hand tremor but no tremor during the visit. His tremor was considered to related to anxiety or alcohol use. He has very limited alcohol now and probably 2 beers per week. And his tremor has been resolved. He still not quit smoking yet, but cut back a lot and now on chantix. BP at home was good, but today 157/97 in clinic. His left sided numbness under control with gabapentin 100mg  bid. He is going to have left knee replacement earlier  next year. He was diagnosed with RA 2 months ago and now on MTX and prednisone. Complains of weight gain. Otherwise no complains.   Interval History During the interval time, pt has been doing well. No complains. Still has RA on hands primarily, on prednisone, MTX and simponi. Not have left knee surgery yet, but can be done anytime when he is ready. Still has mild left leg numbness on gabapentin. BP 130/86. On plavix and lipitor.  REVIEW OF SYSTEMS: Full 14  system review of systems performed and notable only for those listed below and in HPI above, all others are negative:  Constitutional:  fatigue Cardiovascular:  Ear/Nose/Throat:   Skin:  Eyes:   Respiratory:   Gastroitestinal:   Genitourinary:  Hematology/Lymphatic:  Bruise/bleed easily Endocrine:  Musculoskeletal:  Joint pain Allergy/Immunology:  Neurological:  HA Psychiatric:  Sleep:   The following represents the patient's updated allergies and side effects list: Allergies  Allergen Reactions  . Penicillins Anaphylaxis    Has patient had a PCN reaction causing immediate rash, facial/tongue/throat swelling, SOB or lightheadedness with hypotension: Yes Has patient had a PCN reaction causing severe rash involving mucus membranes or skin necrosis: No Has patient had a PCN reaction that required hospitalization Yes Has patient had a PCN reaction occurring within the last 10 years: No If all of the above answers are "NO", then may proceed with Cephalosporin use.     The neurologically relevant items on the patient's problem list were reviewed on today's visit.  Neurologic Examination  A problem focused neurological exam (12 or more points of the single system neurologic examination, vital signs counts as 1 point, cranial nerves count for 8 points) was performed.  Blood pressure 130/86, pulse 83, height 5\' 10"  (1.778 m), weight 201 lb 6.4 oz (91.4 kg).  General - Well nourished, well developed, in no apparent distress.  Ophthalmologic - Sharp disc margins OU.   Cardiovascular - Regular rate and rhythm with no murmur.  Mental Status -  Level of arousal and orientation to time, place, and person were intact. Language including expression, naming, repetition, comprehension was assessed and found intact Fund of Knowledge was assessed and was intact.  Cranial Nerves II - XII - II - Visual field intact OU. III, IV, VI - Extraocular movements intact. V - Facial sensation  intact bilaterally. VII - Facial movement intact bilaterally. VIII - Hearing & vestibular intact bilaterally, no more nystagmus. X - Palate elevates symmetrically. XI - Chin turning & shoulder shrug intact bilaterally. XII - Tongue protrusion intact.  Motor Strength - The patient's strength was normal in all extremities and pronator drift was absent.  Bulk was normal and fasciculations were absent.   Motor Tone - Muscle tone was assessed at the neck and appendages and was normal.  Reflexes - The patient's reflexes were 1+ in all extremities and he had no pathological reflexes.  Sensory - Light touch, temperature/pinprick were assessed and were normal   Coordination - The patient had normal movements in the hands and feet with no ataxia or dysmetria.  Tremor was absent.  Gait and Station - The patient's transfers, posture, gait, station, and turns were observed as normal.   Data reviewed: I personally reviewed the images and agree with the radiology interpretations.  Ct Head Code Stroke W/o Cm 12/01/2016 1. No acute intracranial process. Mild chronic small vessel ischemic disease. 2. ASPECTS is 10.   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 Mild - moderate luminal  irregularity of the intracranial vessels compatible with atherosclerosis.   CTA neck 12/02/2016 Luminal irregularity RIGHT vertebral artery compatible with age indeterminate injury, without flow limiting stenosis. No hemodynamically significant stenosis of the carotid arteries. Asymmetric fullness RIGHT vocal cord, recommend direct inspection on nonemergent basis. Degenerative cervical spine resulting in multilevel severe neural foraminal narrowing.   Mr Brain Wo Contrast 12/02/2016 Punctate LEFT pontine infarct versus artifact. RIGHT V4 segment occlusion. Old small LEFT cerebellar infarct and mild chronic small vessel ischemic disease. Left posterior temporal punctate DWI changes.     TTE Left ventricle: The cavity size was normal. Systolic function was normal. The estimated ejection fraction was in the range of 60%to 65%. Wall motion was normal; there were no regional wallmotion abnormalities. Left ventricular diastolic functionparameters were normal.  LE venous doppler  No evidence of deep vein thrombosis or baker's cysts bilaterally.  Cerebral angio  1  Near complete occlusion of RT VBJ just distal to RT PICA.   2  50 to 70 % stenosis of LT MCA distal M1 seg  CTA head and neck 01/29/17 1. No evidence of acute intracranial abnormality. Mild chronic small vessel ischemic disease. 2. No evidence of emergent large vessel occlusion. 3. Unchanged distal right vertebral artery occlusion. 4. Intracranial atherosclerosis without evidence of flow limiting proximal anterior circulation stenosis. 5. Moderate to severe left P2 PCA stenosis. 6. Mild cervical carotid artery atherosclerosis without stenosis.  MRI brain 01/29/17 No acute intracranial process on this motion degraded examination. Old small LEFT cerebellar infarct and mild chronic small vessel ischemic disease Chronic slow flow versus occluded RIGHT V4 segment.  Component     Latest Ref Rng & Units 12/01/2016 12/02/2016  Cholesterol     0 - 200 mg/dL  045  Triglycerides     <150 mg/dL  94  HDL Cholesterol     >40 mg/dL  28 (L)  Total CHOL/HDL Ratio     RATIO  6.5  VLDL     0 - 40 mg/dL  19  LDL (calc)     0 - 99 mg/dL  409 (H)  Hemoglobin W1X     4.8 - 5.6 % 4.9   Mean Plasma Glucose     mg/dL 94     Assessment: As you may recall, he is a 57 y.o. Caucasian male with PMH of smoking and alcohol abuse admitted on 12/01/16 for punctate left pontine infarct with questionable left posterior temporal punctate infarct. Old small left cerebellar infarct. Received tPA. UDS positive for cocaine. CTA head right VA occlusion. CTA neck unremarkable. 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 EF  60-65%. Negative DVT. LDL 134 and A1C 4.9. He was discharged with DAPT and lipitor. ED visit 01/29/17 with similar symptoms but CTA head and neck as well as MRI not acute changes. UDS again positive for cocaine. Had 30 day cardiac event monitoring which showed no afib. Still not quit smoking yet but on chantix now. Alcohol has been limited. Left hand mild tremor resolved. Complains of left knee pain and will have left knee replacement 11/2017. Still has left sided numbness, on gabapentin, under control. Diagnosed with RA and now on MTX and prednisone.   Plan:  - continue plavix and lipitor for stroke prevention - check BP at home and record - Follow up with your primary care physician for stroke risk factor modification. Recommend maintain blood pressure goal <130/80, diabetes with hemoglobin A1c goal below 7.0% and lipids with LDL cholesterol goal below 70 mg/dL.  -  quit smoking completely - continue abstain from illicit drugs.  - limit alcohol use and continue gabapentin for numbness - OK to have left knee replacement. If you have to hold plavix for 5 days, please take ASA 81mg  during the 5 days without plavix.  - healthy diet and regular exercise  - follow up as needed  No orders of the defined types were placed in this encounter.   No orders of the defined types were placed in this encounter.   Patient Instructions  - continue plavix and lipitor for stroke prevention - check BP at home and record - Follow up with your primary care physician for stroke risk factor modification. Recommend maintain blood pressure goal <130/80, diabetes with hemoglobin A1c goal below 7.0% and lipids with LDL cholesterol goal below 70 mg/dL.  - quit smoking completely - continue abstain from illicit drugs.  - limit alcohol use and continue gabapentin for numbness - OK to have left knee replacement. If you have to hold plavix for 5 days, please take ASA 81mg  during the 5 days without plavix.  - healthy diet  and regular exercise  - follow up as needed.    Marvel Plan, MD PhD [facility] [address], [address] [place], [place] [address] [phone]

## 2018-02-16 NOTE — Patient Instructions (Signed)
-   continue plavix and lipitor for stroke prevention - check BP at home and record - Follow up with your primary care physician for stroke risk factor modification. Recommend maintain blood pressure goal <130/80, diabetes with hemoglobin A1c goal below 7.0% and lipids with LDL cholesterol goal below 70 mg/dL.  - quit smoking completely - continue abstain from illicit drugs.  - limit alcohol use and continue gabapentin for numbness - OK to have left knee replacement. If you have to hold plavix for 5 days, please take ASA 81mg  during the 5 days without plavix.  - healthy diet and regular exercise  - follow up as needed.

## 2018-02-19 ENCOUNTER — Other Ambulatory Visit: Payer: Self-pay | Admitting: Physician Assistant

## 2018-02-19 DIAGNOSIS — M8580 Other specified disorders of bone density and structure, unspecified site: Secondary | ICD-10-CM

## 2018-03-01 ENCOUNTER — Other Ambulatory Visit: Payer: Self-pay | Admitting: Neurology

## 2018-03-01 DIAGNOSIS — I639 Cerebral infarction, unspecified: Secondary | ICD-10-CM

## 2018-03-01 DIAGNOSIS — I6350 Cerebral infarction due to unspecified occlusion or stenosis of unspecified cerebral artery: Secondary | ICD-10-CM

## 2018-03-02 ENCOUNTER — Other Ambulatory Visit: Payer: Self-pay

## 2018-03-02 DIAGNOSIS — I6350 Cerebral infarction due to unspecified occlusion or stenosis of unspecified cerebral artery: Secondary | ICD-10-CM

## 2018-03-02 DIAGNOSIS — I639 Cerebral infarction, unspecified: Secondary | ICD-10-CM

## 2018-03-02 MED ORDER — CLOPIDOGREL BISULFATE 75 MG PO TABS: 75.0000 mg | ORAL_TABLET | Freq: Every day | ORAL | 1 refills | Status: DC

## 2018-03-24 ENCOUNTER — Other Ambulatory Visit: Payer: Self-pay | Admitting: Physician Assistant

## 2018-03-24 DIAGNOSIS — G4452 New daily persistent headache (NDPH): Secondary | ICD-10-CM

## 2018-03-25 ENCOUNTER — Inpatient Hospital Stay
Admission: RE | Admit: 2018-03-25 | Discharge: 2018-03-25 | Disposition: A | Discharge status: Home / Self Care | Payer: BLUE CROSS/BLUE SHIELD | Source: Ambulatory Visit | Attending: Physician Assistant | Admitting: Physician Assistant

## 2018-03-30 ENCOUNTER — Ambulatory Visit
Admission: RE | Admit: 2018-03-30 | Discharge: 2018-03-30 | Disposition: A | Discharge status: Home / Self Care | Payer: BLUE CROSS/BLUE SHIELD | Source: Ambulatory Visit | Attending: Physician Assistant | Admitting: Physician Assistant

## 2018-03-30 DIAGNOSIS — G4452 New daily persistent headache (NDPH): Secondary | ICD-10-CM

## 2018-03-31 ENCOUNTER — Telehealth: Payer: Self-pay | Admitting: Neurology

## 2018-03-31 NOTE — Telephone Encounter (Signed)
Patient's wife calling stating patient had a CT scan of brain yesterday ordered by a PA at [facility] in [place] and would like Dr. Roda Shutters to look at results. Results are online because he had at [facility].

## 2018-03-31 NOTE — Telephone Encounter (Signed)
RN call patients wife on dpr about Ct order by his PCP office. The Ct was order by a PA. RN stated per Dr.Xu they need to call the PCP office to get the results. IF the provider needs our office consult they can call us office directly. Per Dr. Roda Shutters he cannot provide results without a call or request from the ordering provider. The wife verbalized understanding.

## 2018-03-31 NOTE — Telephone Encounter (Signed)
Pt and wife need to call the provider who ordered the test for the test result. If the provider needs our comments on the imaging, he or she needs to contact us directly. We can not provide result without request from the provider who ordered it. Thanks.   Marvel Plan, MD PhD Stroke Neurology 03/31/2018 3:51 PM

## 2018-04-02 ENCOUNTER — Telehealth: Payer: Self-pay | Admitting: Neurology

## 2018-04-02 ENCOUNTER — Other Ambulatory Visit: Payer: BLUE CROSS/BLUE SHIELD

## 2018-04-02 NOTE — Telephone Encounter (Signed)
Pt's wife called today to inquire if PCP office had called to ask Dr Roda Shutters to look at CT ordered by PA. I told her the clinic has not rec'd a call. I also advised her that a radiologist read the CT not the PA and the PA was relaying the report generated by that radiologist. Pt's wife was appreciative and said she was satisfied and would not need the PA to call Dr Roda Shutters.

## 2018-11-30 ENCOUNTER — Telehealth: Payer: Self-pay | Admitting: Adult Health

## 2018-11-30 NOTE — Telephone Encounter (Signed)
Pt stopped by the office dropping off a clearance form. Unsure if NP will sign form w/o seeing him. Requesting a call to advise.  PT PAID $50 for forms.

## 2018-11-30 NOTE — Telephone Encounter (Signed)
Spoke with pt. and explained he has  not been seen since April 2019--by Dr. Roda Shutters, who no longer works in this office.  He will have to be seen and evaluated before clearance to come off of Plavix can be given.  He verbalized understanding of same, will call back for appt/fim

## 2018-12-01 DIAGNOSIS — Z0289 Encounter for other administrative examinations: Secondary | ICD-10-CM

## 2019-01-10 ENCOUNTER — Telehealth: Payer: Self-pay

## 2019-01-10 ENCOUNTER — Ambulatory Visit: Payer: Medicaid Other | Admitting: Neurology

## 2019-01-10 ENCOUNTER — Encounter

## 2019-01-10 ENCOUNTER — Encounter: Payer: Self-pay | Admitting: Neurology

## 2019-01-10 NOTE — Telephone Encounter (Signed)
Pt call to cancel appt today. Pt no show states he was sick.

## 2019-01-24 ENCOUNTER — Telehealth: Payer: Self-pay

## 2019-01-24 NOTE — Telephone Encounter (Signed)
I called pt about doing a telephone visit due to the coronavirus pandemic. I stated we are minimize the risk to patients and staff. Pt gave consent for telephone visit for clearance and to file insurance. Pt stated he pay 50.00 three months ago for clearance form. I explain there is no charge for clearance form and billing will be notified.

## 2019-01-25 ENCOUNTER — Telehealth: Payer: Self-pay | Admitting: Neurology

## 2019-01-26 ENCOUNTER — Ambulatory Visit (INDEPENDENT_AMBULATORY_CARE_PROVIDER_SITE_OTHER): Payer: Medicaid Other | Admitting: Neurology

## 2019-01-26 ENCOUNTER — Other Ambulatory Visit: Payer: Self-pay

## 2019-01-26 DIAGNOSIS — I6990 Unspecified sequelae of unspecified cerebrovascular disease: Secondary | ICD-10-CM

## 2019-01-26 NOTE — Progress Notes (Addendum)
Virtual Visit via Telephone Note  I connected with Hector Booker on 01/26/19 at  2:00 PM EDT by telephone and verified that I am speaking with the correct person using two identifiers.   I discussed the limitations, risks, security and privacy concerns of performing an evaluation and management service by telephone and the availability of in person appointments. I also discussed with the patient that there may be a patient responsible charge related to this service. The patient expressed understanding and agreed to proceed.   History of Present Illness: Hector Booker is seen today for virtual telephonic follow-up visit after last visit in April 2019.  He states he is doing well and has not had any recurrent stroke or TIA symptoms.  He remains on Plavix 75 mg daily which is tolerating well without bruising or bleeding.  He is also on Lipitor 40 mg and tolerating it well.  He cannot tell me the last time he had lipid profile checked.  His rheumatoid arthritis has been acting up.  He has been seeing his medical doctor for that.  He has stopped methotrexate and is currently on prednisone alone.  Patient is bothered by left knee pain and plans to have knee replacement surgery done by Dr. Valentina Gu.  He wants neurological clearance for the procedure.  He has no new neurological complaints today.    Observations/Objective: Patient's prior neurological office follow-up visits with Dr. Roda Shutters were reviewed.  Assessment and Plan: 58 year old male with remote history of left pontine infarct in January 2018 and old left cerebellar infarct with vascular risk factors of cocaine abuse, intracranial atherosclerosis, hyperlipidemia and smoking.  He seems to be stable from neurovascular standpoint.   Follow Up Instructions: Continue Plavix for stroke prevention with strict control of hypertension with blood pressure goal below 130/90, lipids with LDL cholesterol goal below 70 mg percent and diabetes with hemoglobin A1c goal below  6.5%.  He was encouraged to continue not to smoke and do drugs.  Patient was given neurological clearance for knee surgery and was asked to hold Plavix 3 days prior to the procedure and to resume it after the procedure when safe with a small but acceptable periprocedural risk of TIA/stroke if patient is willing.   He was advised to follow-up in the future in a year or call earlier if necessary Check follow-up carotid ultrasound study as he is not had one for a long time I discussed the assessment and treatment plan with the patient. The patient was provided an opportunity to ask questions and all were answered. The patient agreed with the plan and demonstrated an understanding of the instructions.   The patient was advised to call back or seek an in-person evaluation if the symptoms worsen or if the condition fails to improve as anticipated.  I provided 21 minutes of non-face-to-face time during this encounter.   Delia Heady, MD

## 2019-01-26 NOTE — Addendum Note (Signed)
Addended by: Gates Rigg on: 01/26/2019 02:08 PM   Modules accepted: Orders

## 2019-03-29 ENCOUNTER — Ambulatory Visit (HOSPITAL_COMMUNITY)
Admission: RE | Admit: 2019-03-29 | Discharge: 2019-03-29 | Disposition: A | Discharge status: Home / Self Care | Payer: Medicaid Other | Source: Ambulatory Visit | Attending: Neurology | Admitting: Neurology

## 2019-03-29 DIAGNOSIS — I6990 Unspecified sequelae of unspecified cerebrovascular disease: Secondary | ICD-10-CM

## 2019-04-04 ENCOUNTER — Ambulatory Visit: Payer: Medicaid Other | Admitting: Neurology

## 2019-04-14 ENCOUNTER — Telehealth: Payer: Self-pay

## 2019-04-14 NOTE — Telephone Encounter (Signed)
-----   Message from Garvin Fila, MD sent at 04/08/2019 11:42 AM EDT ----- Hector Booker inform the patient that MR carotid ultrasound study showed no significant narrowing of either carotid artery in the neck.

## 2019-04-14 NOTE — Telephone Encounter (Signed)
Notes recorded by Marval Regal, RN on 04/14/2019 at 9:01 AM EDT  I call pts wife that his MR carotid ultrasound study showed no significant narrowing of either carotid artery in neck. Pts wife verbalized understanding.  ------

## 2019-05-02 ENCOUNTER — Other Ambulatory Visit: Payer: Self-pay

## 2019-05-02 DIAGNOSIS — M25569 Pain in unspecified knee: Secondary | ICD-10-CM

## 2019-05-04 ENCOUNTER — Encounter: Payer: Self-pay | Admitting: *Deleted

## 2019-05-04 ENCOUNTER — Other Ambulatory Visit: Payer: Self-pay

## 2019-05-04 ENCOUNTER — Encounter: Payer: Self-pay | Admitting: Vascular Surgery

## 2019-05-04 ENCOUNTER — Ambulatory Visit (HOSPITAL_COMMUNITY)
Admission: RE | Admit: 2019-05-04 | Discharge: 2019-05-04 | Disposition: A | Discharge status: Home / Self Care | Payer: Medicaid Other | Source: Ambulatory Visit | Attending: Vascular Surgery | Admitting: Vascular Surgery

## 2019-05-04 ENCOUNTER — Ambulatory Visit (INDEPENDENT_AMBULATORY_CARE_PROVIDER_SITE_OTHER): Payer: Medicaid Other | Admitting: Vascular Surgery

## 2019-05-04 VITALS — BP 94/63 | HR 82 | Temp 97.7°F | Resp 20 | Ht 70.0 in | Wt 209.0 lb

## 2019-05-04 DIAGNOSIS — I739 Peripheral vascular disease, unspecified: Secondary | ICD-10-CM

## 2019-05-04 DIAGNOSIS — M25569 Pain in unspecified knee: Secondary | ICD-10-CM

## 2019-05-04 NOTE — Progress Notes (Signed)
REASON FOR CONSULT:    To evaluate for peripheral vascular disease.  The consult is requested by Dr. Kathryne Eriksson.  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  Given that he is being considered for knee replacement I think it would be reasonable to pursue arteriography to see what options he might have for revascularization.  I explained that there is a small chance we would find something on the left that could be addressed with angioplasty and stenting.  We have also discussed the importance of tobacco cessation.  I encouraged him to walk as much as possible to develop collaterals.  However given that he is being considered for knee replacement I think arteriography is warranted.  I did explain that I do not think his burning pain in his feet or his left groin pain is related to his peripheral vascular disease.  I have reviewed with the patient the indications for arteriography. In addition, I have reviewed the potential complications of arteriography including but not limited to: Bleeding, arterial injury, arterial thrombosis, dye action, renal insufficiency, or other unpredictable medical problems. I have explained to the patient that if we find disease amenable to angioplasty we could potentially address this at the same time. I have discussed the potential complications of angioplasty and stenting, including but not limited to: Bleeding, arterial thrombosis, arterial injury, dissection, or the need for surgical intervention.  Deitra Mayo, MD, FACS Beeper [phone] Office: [phone]   HPI:   Hector Booker is a pleasant 58 y.o. male, referred with leg pain and toe pain.  I have reviewed the records from the referring office.  The patient has a history of arthritis in his left knee.  He was being evaluated for a left knee  replacement.  The patient also has a history of hypertension which is been under good control and hyperlipidemia.  This patient describes the gradual onset of pain in both legs in the calves which is brought on by ambulation and relieved with rest.  This is been going on for 2 months.  The symptoms are more significant on the left side.  He does describe some left groin pain but no real hip or thigh claudication.  He describes calf claudication at a short distance.  He also has arthritis of the left knee and is being considered for a knee replacement on the left.  His risk factors for peripheral vascular disease include hypertension, hypercholesterolemia, and tobacco use.  He denies any history of diabetes or family history of premature cardiovascular disease.  He has had a previous stroke associated with left-sided weakness.  He is on Plavix and a statin.  He also has a history of neuropathy and is on Neurontin.  Past Medical History:  Diagnosis Date  . Arthritis   . Hyperlipemia   . Stroke [facility])     Family History  Problem Relation Age of Onset  . Heart attack Father   . Stroke Paternal Grandfather     SOCIAL HISTORY: Social History   Socioeconomic History  . Marital status: Married    Spouse name: Not on file  . Number of children: Not on file  . Years of education: Not on file  . Highest education level: Not on file  Occupational History  . Not on file  Social Needs  . Financial resource strain:  Not on file  . Food insecurity    Worry: Not on file    Inability: Not on file  . Transportation needs    Medical: Not on file    Non-medical: Not on file  Tobacco Use  . Smoking status: Current Every Day Smoker    Packs/day: 0.25    Types: Cigars  . Smokeless tobacco: Never Used  . Tobacco comment: black mild 2 and three day smoke 4 per day  Substance and Sexual Activity  . Alcohol use: Yes    Alcohol/week: 1.0 standard drinks    Types: 1 Cans of beer per week     Comment:  1 to  2 beers  weekly  . Drug use: Not Currently    Types: Codeine, Cocaine    Comment: last use January 2019  . Sexual activity: Never  Lifestyle  . Physical activity    Days per week: Not on file    Minutes per session: Not on file  . Stress: Not on file  Relationships  . Social Musicianconnections    Talks on phone: Not on file    Gets together: Not on file    Attends religious service: Not on file    Active member of club or organization: Not on file    Attends meetings of clubs or organizations: Not on file    Relationship status: Not on file  . Intimate partner violence    Fear of current or ex partner: Not on file    Emotionally abused: Not on file    Physically abused: Not on file    Forced sexual activity: Not on file  Other Topics Concern  . Not on file  Social History Narrative  . Not on file    Allergies  Allergen Reactions  . Penicillins Anaphylaxis    Has patient had a PCN reaction causing immediate rash, facial/tongue/throat swelling, SOB or lightheadedness with hypotension: Yes Has patient had a PCN reaction causing severe rash involving mucus membranes or skin necrosis: No Has patient had a PCN reaction that required hospitalization Yes Has patient had a PCN reaction occurring within the last 10 years: No If all of the above answers are "NO", then may proceed with Cephalosporin use.     Current Outpatient Medications  Medication Sig Dispense Refill  . atorvastatin (LIPITOR) 40 MG tablet Take 1 tablet (40 mg total) by mouth daily at 6 PM. 30 tablet 2  . clopidogrel (PLAVIX) 75 MG tablet Take 1 tablet (75 mg total) by mouth daily. 90 tablet 1  . famotidine (PEPCID) 20 MG tablet Take 1 tablet (20 mg total) by mouth 2 (two) times daily. 30 tablet 0  . gabapentin (NEURONTIN) 100 MG capsule TAKE ONE CAPSULE BY MOUTH TWICE A DAY FOR PAIN OR NEUROPATHY 60 capsule 3  . HYDROcodone-acetaminophen (NORCO) 10-325 MG tablet Take 0.5 tablets by mouth 3 (three) times  daily as needed for moderate pain.     Marland Kitchen. lisinopril (ZESTRIL) 20 MG tablet TAKE 1 TABLET BY MOUTH EVERY DAY FOR BLOOD PRESSURE CONTROL     No current facility-administered medications for this visit.     REVIEW OF SYSTEMS:  [X]  denotes positive finding, [ ]  denotes negative finding Cardiac  Comments:  Chest pain or chest pressure:    Shortness of breath upon exertion:    Short of breath when lying flat:    Irregular heart rhythm:        Vascular    Pain in calf, thigh, or hip  brought on by ambulation: x   Pain in feet at night that wakes you up from your sleep:  x   Blood clot in your veins:    Leg swelling:         Pulmonary    Oxygen at home:    Productive cough:     Wheezing:         Neurologic    Sudden weakness in arms or legs:     Sudden numbness in arms or legs:     Sudden onset of difficulty speaking or slurred speech:    Temporary loss of vision in one eye:     Problems with dizziness:  x       Gastrointestinal    Blood in stool:     Vomited blood:         Genitourinary    Burning when urinating:     Blood in urine:        Psychiatric    Major depression:         Hematologic    Bleeding problems:    Problems with blood clotting too easily:        Skin    Rashes or ulcers:        Constitutional    Fever or chills:     PHYSICAL EXAM:   Vitals:   05/04/19 1428  BP: 94/63  Pulse: 82  Resp: 20  Temp: 97.7 F (36.5 C)  TempSrc: Temporal  SpO2: 94%  Weight: 209 lb (94.8 kg)  Height: 5\' 10"  (1.778 m)   GENERAL: The patient is a well-nourished male, in no acute distress. The vital signs are documented above. CARDIAC: There is a regular rate and rhythm.  VASCULAR: I do not detect carotid bruits. On the left side I do not palpate a femoral, popliteal, or pedal pulses. On the right side he has a palpable femoral, popliteal, and posterior tibial pulse. He has mild bilateral lower extremity swelling. PULMONARY: There is good air exchange bilaterally  without wheezing or rales. ABDOMEN: Soft and non-tender with normal pitched bowel sounds.  I do not palpate an abdominal aortic aneurysm. MUSCULOSKELETAL: There are no major deformities or cyanosis. NEUROLOGIC: No focal weakness or paresthesias are detected. SKIN: There are no ulcers or rashes noted. PSYCHIATRIC: The patient has a normal affect.  DATA:    ARTERIAL DOPPLER STUDY: I have independently interpreted his arterial Doppler study.  On the right side he has a triphasic dorsalis pedis and posterior tibial signal.  ABI is 100%.  Toe pressure is 51 mmHg.  On the left side there is a monophasic dorsalis pedis and posterior tibial signal.  ABI is 73%.  Toe pressure is 48 mmHg.

## 2019-05-05 ENCOUNTER — Other Ambulatory Visit: Payer: Self-pay | Admitting: *Deleted

## 2019-05-24 ENCOUNTER — Other Ambulatory Visit (HOSPITAL_COMMUNITY)
Admission: RE | Admit: 2019-05-24 | Discharge: 2019-05-24 | Disposition: A | Discharge status: Home / Self Care | Payer: Medicaid Other | Source: Ambulatory Visit | Attending: Vascular Surgery | Admitting: Vascular Surgery

## 2019-05-24 DIAGNOSIS — Z1159 Encounter for screening for other viral diseases: Secondary | ICD-10-CM | POA: Insufficient documentation

## 2019-05-24 LAB — SARS CORONAVIRUS 2 (TAT 6-24 HRS): SARS Coronavirus 2: NEGATIVE

## 2019-05-27 ENCOUNTER — Ambulatory Visit (HOSPITAL_COMMUNITY): Admission: RE | Admit: 2019-05-27 | Payer: Medicaid Other | Source: Home / Self Care | Admitting: Vascular Surgery

## 2019-05-27 ENCOUNTER — Encounter (HOSPITAL_COMMUNITY): Admission: RE | Payer: Self-pay | Source: Home / Self Care

## 2019-05-27 SURGERY — ABDOMINAL AORTOGRAM W/LOWER EXTREMITY
Anesthesia: LOCAL | Laterality: Bilateral

## 2019-07-05 ENCOUNTER — Other Ambulatory Visit: Payer: Self-pay

## 2019-07-05 DIAGNOSIS — I739 Peripheral vascular disease, unspecified: Secondary | ICD-10-CM

## 2019-07-28 ENCOUNTER — Other Ambulatory Visit (HOSPITAL_COMMUNITY)
Admission: RE | Admit: 2019-07-28 | Discharge: 2019-07-28 | Disposition: A | Discharge status: Home / Self Care | Payer: Medicaid Other | Source: Ambulatory Visit | Attending: Vascular Surgery | Admitting: Vascular Surgery

## 2019-07-28 ENCOUNTER — Telehealth: Payer: Self-pay

## 2019-07-28 DIAGNOSIS — Z20828 Contact with and (suspected) exposure to other viral communicable diseases: Secondary | ICD-10-CM | POA: Diagnosis not present

## 2019-07-28 DIAGNOSIS — Z01812 Encounter for preprocedural laboratory examination: Secondary | ICD-10-CM | POA: Diagnosis present

## 2019-07-28 LAB — SARS CORONAVIRUS 2 (TAT 6-24 HRS): SARS Coronavirus 2: NEGATIVE

## 2019-07-28 NOTE — Telephone Encounter (Signed)
Pt called because he has not had covid test done for his procedure tomorrow. [place] and they said that if he comes now it will be ready by his procedure tomorrow. Pt notified and order entered.   York Cerise, CMA

## 2019-07-29 ENCOUNTER — Ambulatory Visit (HOSPITAL_COMMUNITY)
Admission: RE | Admit: 2019-07-29 | Discharge: 2019-07-29 | Disposition: A | Discharge status: Home / Self Care | Payer: Medicaid Other | Attending: Vascular Surgery | Admitting: Vascular Surgery

## 2019-07-29 ENCOUNTER — Encounter (HOSPITAL_COMMUNITY): Payer: Self-pay | Admitting: Vascular Surgery

## 2019-07-29 ENCOUNTER — Encounter (HOSPITAL_COMMUNITY)
Admission: RE | Disposition: A | Discharge status: Home / Self Care | Payer: Self-pay | Source: Home / Self Care | Attending: Vascular Surgery

## 2019-07-29 ENCOUNTER — Other Ambulatory Visit: Payer: Self-pay

## 2019-07-29 DIAGNOSIS — I739 Peripheral vascular disease, unspecified: Secondary | ICD-10-CM

## 2019-07-29 DIAGNOSIS — Z880 Allergy status to penicillin: Secondary | ICD-10-CM | POA: Diagnosis not present

## 2019-07-29 DIAGNOSIS — I10 Essential (primary) hypertension: Secondary | ICD-10-CM | POA: Insufficient documentation

## 2019-07-29 DIAGNOSIS — Z79899 Other long term (current) drug therapy: Secondary | ICD-10-CM | POA: Insufficient documentation

## 2019-07-29 DIAGNOSIS — E785 Hyperlipidemia, unspecified: Secondary | ICD-10-CM | POA: Diagnosis not present

## 2019-07-29 DIAGNOSIS — Z7902 Long term (current) use of antithrombotics/antiplatelets: Secondary | ICD-10-CM | POA: Diagnosis not present

## 2019-07-29 DIAGNOSIS — F17290 Nicotine dependence, other tobacco product, uncomplicated: Secondary | ICD-10-CM | POA: Diagnosis not present

## 2019-07-29 DIAGNOSIS — M1712 Unilateral primary osteoarthritis, left knee: Secondary | ICD-10-CM | POA: Insufficient documentation

## 2019-07-29 DIAGNOSIS — E7800 Pure hypercholesterolemia, unspecified: Secondary | ICD-10-CM | POA: Insufficient documentation

## 2019-07-29 DIAGNOSIS — Z8673 Personal history of transient ischemic attack (TIA), and cerebral infarction without residual deficits: Secondary | ICD-10-CM | POA: Insufficient documentation

## 2019-07-29 DIAGNOSIS — I70212 Atherosclerosis of native arteries of extremities with intermittent claudication, left leg: Secondary | ICD-10-CM

## 2019-07-29 HISTORY — PX: LOWER EXTREMITY ANGIOGRAPHY: CATH118251

## 2019-07-29 LAB — POCT I-STAT, CHEM 8
BUN: 13 mg/dL (ref 6–20)
Calcium, Ion: 1.21 mmol/L (ref 1.15–1.40)
Chloride: 102 mmol/L (ref 98–111)
Creatinine, Ser: 0.6 mg/dL — ABNORMAL LOW (ref 0.61–1.24)
Glucose, Bld: 84 mg/dL (ref 70–99)
HCT: 44 % (ref 39.0–52.0)
Hemoglobin: 15 g/dL (ref 13.0–17.0)
Potassium: 3.9 mmol/L (ref 3.5–5.1)
Sodium: 140 mmol/L (ref 135–145)
TCO2: 26 mmol/L (ref 22–32)

## 2019-07-29 SURGERY — LOWER EXTREMITY ANGIOGRAPHY
Anesthesia: LOCAL

## 2019-07-29 MED ORDER — FENTANYL CITRATE (PF) 100 MCG/2ML IJ SOLN
INTRAMUSCULAR | Status: DC | PRN
  Administered 2019-07-29: 25 ug via INTRAVENOUS
  Administered 2019-07-29: 50 ug via INTRAVENOUS

## 2019-07-29 MED ORDER — LIDOCAINE HCL (PF) 1 % IJ SOLN
INTRAMUSCULAR | Status: DC | PRN
  Administered 2019-07-29: 30 mL

## 2019-07-29 MED ORDER — SODIUM CHLORIDE 0.9% FLUSH: 3.0000 mL | INTRAVENOUS | Status: DC | PRN

## 2019-07-29 MED ORDER — MIDAZOLAM HCL 2 MG/2ML IJ SOLN
INTRAMUSCULAR | Status: AC
  Filled 2019-07-29: qty 2

## 2019-07-29 MED ORDER — ONDANSETRON HCL 4 MG/2ML IJ SOLN: 4.0000 mg | Freq: Four times a day (QID) | INTRAMUSCULAR | Status: DC | PRN

## 2019-07-29 MED ORDER — HYDRALAZINE HCL 20 MG/ML IJ SOLN
INTRAMUSCULAR | Status: DC | PRN
  Administered 2019-07-29: 10 mg via INTRAVENOUS

## 2019-07-29 MED ORDER — SODIUM CHLORIDE 0.9 % IV SOLN: 250.0000 mL | INTRAVENOUS | Status: DC | PRN

## 2019-07-29 MED ORDER — OXYCODONE HCL 5 MG PO TABS
ORAL_TABLET | ORAL | Status: AC
  Filled 2019-07-29: qty 2

## 2019-07-29 MED ORDER — SODIUM CHLORIDE 0.9 % IV SOLN
INTRAVENOUS | Status: DC
  Administered 2019-07-29: 10:00:00 via INTRAVENOUS

## 2019-07-29 MED ORDER — LABETALOL HCL 5 MG/ML IV SOLN: 10.0000 mg | INTRAVENOUS | Status: DC | PRN

## 2019-07-29 MED ORDER — HEPARIN (PORCINE) IN NACL 1000-0.9 UT/500ML-% IV SOLN
INTRAVENOUS | Status: AC
  Filled 2019-07-29: qty 1000

## 2019-07-29 MED ORDER — HEPARIN (PORCINE) IN NACL 1000-0.9 UT/500ML-% IV SOLN
INTRAVENOUS | Status: DC | PRN
  Administered 2019-07-29: 500 mL

## 2019-07-29 MED ORDER — HYDRALAZINE HCL 20 MG/ML IJ SOLN
INTRAMUSCULAR | Status: AC
  Filled 2019-07-29: qty 1

## 2019-07-29 MED ORDER — MIDAZOLAM HCL 2 MG/2ML IJ SOLN
INTRAMUSCULAR | Status: DC | PRN
  Administered 2019-07-29: 1 mg via INTRAVENOUS

## 2019-07-29 MED ORDER — SODIUM CHLORIDE 0.9% FLUSH: 3.0000 mL | Freq: Two times a day (BID) | INTRAVENOUS | Status: DC

## 2019-07-29 MED ORDER — LIDOCAINE HCL (PF) 1 % IJ SOLN
INTRAMUSCULAR | Status: AC
  Filled 2019-07-29: qty 30

## 2019-07-29 MED ORDER — ACETAMINOPHEN 325 MG PO TABS: 650.0000 mg | ORAL_TABLET | ORAL | Status: DC | PRN

## 2019-07-29 MED ORDER — FENTANYL CITRATE (PF) 100 MCG/2ML IJ SOLN
INTRAMUSCULAR | Status: AC
  Filled 2019-07-29: qty 2

## 2019-07-29 MED ORDER — IOHEXOL 350 MG/ML SOLN
INTRAVENOUS | Status: DC | PRN
  Administered 2019-07-29: 13:00:00 147 mL

## 2019-07-29 MED ORDER — HYDRALAZINE HCL 20 MG/ML IJ SOLN: 5.0000 mg | INTRAMUSCULAR | Status: DC | PRN

## 2019-07-29 MED ORDER — SODIUM CHLORIDE 0.9 % WEIGHT BASED INFUSION: 1.0000 mL/kg/h | INTRAVENOUS | Status: DC

## 2019-07-29 MED ORDER — OXYCODONE HCL 5 MG PO TABS
5.0000 mg | ORAL_TABLET | ORAL | Status: DC | PRN
  Administered 2019-07-29: 10 mg via ORAL

## 2019-07-29 SURGICAL SUPPLY — 12 items
CATH ANGIO 5F PIGTAIL 65CM (CATHETERS) ×1 IMPLANT
KIT MICROPUNCTURE NIT STIFF (SHEATH) ×1 IMPLANT
KIT PV (KITS) ×2 IMPLANT
SHEATH PINNACLE 5F 10CM (SHEATH) ×1 IMPLANT
SHEATH PROBE COVER 6X72 (BAG) ×1 IMPLANT
STOPCOCK MORSE 400PSI 3WAY (MISCELLANEOUS) ×1 IMPLANT
SYR MEDRAD MARK 7 150ML (SYRINGE) ×2 IMPLANT
TRANSDUCER W/STOPCOCK (MISCELLANEOUS) ×2 IMPLANT
TRAY PV CATH (CUSTOM PROCEDURE TRAY) ×2 IMPLANT
TUBING CIL FLEX 10 FLL-RA (TUBING) ×1 IMPLANT
WIRE BENTSON .035X145CM (WIRE) ×1 IMPLANT
WIRE TORQFLEX AUST .018X40CM (WIRE) ×1 IMPLANT

## 2019-07-29 NOTE — Op Note (Signed)
PATIENT: Hector Booker      MRN: [id number] DOB: 22-Jan-1961    DATE OF PROCEDURE: 07/29/2019  INDICATIONS:    Hector Booker is a 57 y.o. male who presents with disabling claudication of the left lower extremity.  In addition he is being considered for a knee replacement.  He had no femoral pulse on the left and presents for arteriography and possible intervention.  PROCEDURE:    1.  Conscious sedation 2.  Ultrasound-guided access to the right common femoral artery 3.  Aortogram with bilateral iliac arteriogram and bilateral lower extremity runoff  SURGEON: Judeth Cornfield. Scot Dock, MD, FACS  ANESTHESIA: Local with sedation  EBL: Minimal  TECHNIQUE: The patient was brought to the peripheral vascular lab and was sedated. The period of conscious sedation was 28 minutes.  During that time period, I was present face-to-face 100% of the time.  The patient was administered 1 mg of Versed and 50 mcg of fentanyl.. The patient's heart rate, blood pressure, and oxygen saturation were monitored by the nurse continuously during the procedure.  Both groins were prepped and draped in the usual sterile fashion.  Under ultrasound guidance, after the skin was anesthetized, I cannulated the left common femoral artery with a micropuncture needle.  The micropuncture wire would not advance beyond approximately 3 cm beyond the point of cannulation suggesting an occlusion at this level.  I therefore remove the wire and needle and pressure was held for hemostasis.  Next under ultrasound guidance, after the skin was anesthetized I cannulated the right common femoral artery with a micropuncture needle and a micropuncture sheath was introduced over a wire.  This was exchanged for a 5 [place] sheath over a Bentson wire.  By ultrasound the femoral artery was patent. A real-time image was obtained and sent to the server.  The pigtail catheter was positioned at the L1 vertebral body and flush aortogram obtained.  The  catheter was then positioned above the aortic bifurcation and oblique iliac projections were obtained.  Next bilateral lower extremity runoff films were obtained.  The catheter was then removed over a wire.  The patient was transferred to the holding area for removal of the sheath.  No immediate complications were noted.  FINDINGS:   1.  Single renal arteries bilaterally with no significant renal artery stenosis identified. 2.  The infrarenal aorta, bilateral common iliac arteries and bilateral hypogastric arteries are patent. 3.  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  Below that the common femoral, deep femoral, superficial femoral, popliteal, posterior tibial, and peroneal arteries are patent.  The anterior tibial artery has severe diffuse disease and is occluded distally. 4.  On the right side the external iliac, common femoral, deep femoral, superficial femoral arteries are patent.  There is a focal stenosis at the adductor canal of approximately 50%.  The popliteal artery is patent and there is three-vessel runoff on the right via the anterior tibial, posterior tibial, and peroneal arteries.  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  I think the best option would be a right to left femorofemoral bypass graft.  This would have to be performed prior to considering knee replacement on the left.  He would like to discuss this with  his family before deciding when to proceed.  Waverly Ferrari, MD, FACS [facility]  DATE OF DICTATION:   07/29/2019

## 2019-07-29 NOTE — H&P (Signed)
REASON FOR ADMISSION:    Peripheral vascular disease.   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  Given that he is being considered for knee replacement I think it would be reasonable to pursue arteriography to see what options he might have for revascularization.  I explained that there is a small chance we would find something on the left that could be addressed with angioplasty and stenting.  We have also discussed the importance of tobacco cessation.  I encouraged him to walk as much as possible to develop collaterals.  However given that he is being considered for knee replacement I think arteriography is warranted.  I did explain that I do not think his burning pain in his feet or his left groin pain is related to his peripheral vascular disease.  I have reviewed with the patient the indications for arteriography. In addition, I have reviewed the potential complications of arteriography including but not limited to: Bleeding, arterial injury, arterial thrombosis, dye action, renal insufficiency, or other unpredictable medical problems. I have explained to the patient that if we find disease amenable to angioplasty we could potentially address this at the same time. I have discussed the potential complications of angioplasty and stenting, including but not limited to: Bleeding, arterial thrombosis, arterial injury, dissection, or the need for surgical intervention.  Deitra Mayo, MD, FACS Beeper [phone] Office: [phone]   HPI:   Hector Booker is a pleasant 58 y.o. male, referred with leg pain and toe pain.  I have reviewed the records from the referring office.  The patient has a history of arthritis in his left knee.  He was being evaluated for a left knee replacement.  The patient also has a history of  hypertension which is been under good control and hyperlipidemia.  This patient describes the gradual onset of pain in both legs in the calves which is brought on by ambulation and relieved with rest.  This is been going on for 2 months.  The symptoms are more significant on the left side.  He does describe some left groin pain but no real hip or thigh claudication.  He describes calf claudication at a short distance.  He also has arthritis of the left knee and is being considered for a knee replacement on the left.  His risk factors for peripheral vascular disease include hypertension, hypercholesterolemia, and tobacco use.  He denies any history of diabetes or family history of premature cardiovascular disease.  He has had a previous stroke associated with left-sided weakness.  He is on Plavix and a statin.  He also has a history of neuropathy and is on Neurontin.      Past Medical History:  Diagnosis Date  . Arthritis   . Hyperlipemia   . Stroke [facility])          Family History  Problem Relation Age of Onset  . Heart attack Father   . Stroke Paternal Grandfather     SOCIAL HISTORY: Social History        Socioeconomic History  . Marital status: Married    Spouse name: Not on file  . Number of children: Not on file  . Years of education: Not on file  . Highest education level: Not on file  Occupational History  . Not on file  Social Needs  . Financial  resource strain: Not on file  . Food insecurity    Worry: Not on file    Inability: Not on file  . Transportation needs    Medical: Not on file    Non-medical: Not on file  Tobacco Use  . Smoking status: Current Every Day Smoker    Packs/day: 0.25    Types: Cigars  . Smokeless tobacco: Never Used  . Tobacco comment: black mild 2 and three day smoke 4 per day  Substance and Sexual Activity  . Alcohol use: Yes    Alcohol/week: 1.0 standard drinks    Types: 1 Cans of beer per week     Comment:  1 to  2 beers  weekly  . Drug use: Not Currently    Types: Codeine, Cocaine    Comment: last use January 2019  . Sexual activity: Never  Lifestyle  . Physical activity    Days per week: Not on file    Minutes per session: Not on file  . Stress: Not on file  Relationships  . Social Musician on phone: Not on file    Gets together: Not on file    Attends religious service: Not on file    Active member of club or organization: Not on file    Attends meetings of clubs or organizations: Not on file    Relationship status: Not on file  . Intimate partner violence    Fear of current or ex partner: Not on file    Emotionally abused: Not on file    Physically abused: Not on file    Forced sexual activity: Not on file  Other Topics Concern  . Not on file  Social History Narrative  . Not on file         Allergies  Allergen Reactions  . Penicillins Anaphylaxis    Has patient had a PCN reaction causing immediate rash, facial/tongue/throat swelling, SOB or lightheadedness with hypotension: Yes Has patient had a PCN reaction causing severe rash involving mucus membranes or skin necrosis: No Has patient had a PCN reaction that required hospitalization Yes Has patient had a PCN reaction occurring within the last 10 years: No If all of the above answers are "NO", then may proceed with Cephalosporin use.           Current Outpatient Medications  Medication Sig Dispense Refill  . atorvastatin (LIPITOR) 40 MG tablet Take 1 tablet (40 mg total) by mouth daily at 6 PM. 30 tablet 2  . clopidogrel (PLAVIX) 75 MG tablet Take 1 tablet (75 mg total) by mouth daily. 90 tablet 1  . famotidine (PEPCID) 20 MG tablet Take 1 tablet (20 mg total) by mouth 2 (two) times daily. 30 tablet 0  . gabapentin (NEURONTIN) 100 MG capsule TAKE ONE CAPSULE BY MOUTH TWICE A DAY FOR PAIN OR NEUROPATHY 60 capsule 3  . HYDROcodone-acetaminophen (NORCO) 10-325 MG  tablet Take 0.5 tablets by mouth 3 (three) times daily as needed for moderate pain.     Marland Kitchen lisinopril (ZESTRIL) 20 MG tablet TAKE 1 TABLET BY MOUTH EVERY DAY FOR BLOOD PRESSURE CONTROL     No current facility-administered medications for this visit.     REVIEW OF SYSTEMS:  [X]  denotes positive finding, [ ]  denotes negative finding Cardiac  Comments:  Chest pain or chest pressure:    Shortness of breath upon exertion:    Short of breath when lying flat:    Irregular heart rhythm:  Vascular    Pain in calf, thigh, or hip brought on by ambulation: x   Pain in feet at night that wakes you up from your sleep:  x   Blood clot in your veins:    Leg swelling:         Pulmonary    Oxygen at home:    Productive cough:     Wheezing:         Neurologic    Sudden weakness in arms or legs:     Sudden numbness in arms or legs:     Sudden onset of difficulty speaking or slurred speech:    Temporary loss of vision in one eye:     Problems with dizziness:  x       Gastrointestinal    Blood in stool:     Vomited blood:         Genitourinary    Burning when urinating:     Blood in urine:        Psychiatric    Major depression:         Hematologic    Bleeding problems:    Problems with blood clotting too easily:        Skin    Rashes or ulcers:        Constitutional    Fever or chills:     PHYSICAL EXAM:      Vitals:   05/04/19 1428  BP: 94/63  Pulse: 82  Resp: 20  Temp: 97.7 F (36.5 C)  TempSrc: Temporal  SpO2: 94%  Weight: 209 lb (94.8 kg)  Height: 5\' 10"  (1.778 m)   GENERAL: The patient is a well-nourished male, in no acute distress. The vital signs are documented above. CARDIAC: There is a regular rate and rhythm.  VASCULAR: I do not detect carotid bruits. On the left side I do not palpate a femoral, popliteal, or pedal pulses. On the right side he has a  palpable femoral, popliteal, and posterior tibial pulse. He has mild bilateral lower extremity swelling. PULMONARY: There is good air exchange bilaterally without wheezing or rales. ABDOMEN: Soft and non-tender with normal pitched bowel sounds.  I do not palpate an abdominal aortic aneurysm. MUSCULOSKELETAL: There are no major deformities or cyanosis. NEUROLOGIC: No focal weakness or paresthesias are detected. SKIN: There are no ulcers or rashes noted. PSYCHIATRIC: The patient has a normal affect.  DATA:    ARTERIAL DOPPLER STUDY: I have independently interpreted his arterial Doppler study.  On the right side he has a triphasic dorsalis pedis and posterior tibial signal.  ABI is 100%.  Toe pressure is 51 mmHg.  On the left side there is a monophasic dorsalis pedis and posterior tibial signal.  ABI is 73%.  Toe pressure is 48 mmHg.

## 2019-07-29 NOTE — Progress Notes (Addendum)
Site area: rt groin fa sheath pulled and pressure held by Caron Presume, RN Site Prior to Removal:  Level 0 Pressure Applied For: 20 minutes Manual:   yes Patient Status During Pull:  stable Post Pull Site:  Level  0 Post Pull Instructions Given:  yes Post Pull Pulses Present: rt dp palpable Dressing Applied:  Gauze and tegaderm Bedrest begins @ 1350 Comments:

## 2019-07-29 NOTE — Discharge Instructions (Signed)
Femoral Site Care °This sheet gives you information about how to care for yourself after your procedure. Your health care provider may also give you more specific instructions. If you have problems or questions, contact your health care provider. °What can I expect after the procedure? °After the procedure, it is common to have: °· Bruising that usually fades within 1-2 weeks. °· Tenderness at the site. °Follow these instructions at home: °Wound care °· Follow instructions from your health care provider about how to take care of your insertion site. Make sure you: °? Wash your hands with soap and water before you change your bandage (dressing). If soap and water are not available, use hand sanitizer. °? Change your dressing as told by your health care provider. °? Leave stitches (sutures), skin glue, or adhesive strips in place. These skin closures may need to stay in place for 2 weeks or longer. If adhesive strip edges start to loosen and curl up, you may trim the loose edges. Do not remove adhesive strips completely unless your health care provider tells you to do that. °· Do not take baths, swim, or use a hot tub until your health care provider approves. °· You may shower 24-48 hours after the procedure or as told by your health care provider. °? Gently wash the site with plain soap and water. °? Pat the area dry with a clean towel. °? Do not rub the site. This may cause bleeding. °· Do not apply powder or lotion to the site. Keep the site clean and dry. °· Check your femoral site every day for signs of infection. Check for: °? Redness, swelling, or pain. °? Fluid or blood. °? Warmth. °? Pus or a bad smell. °Activity °· For the first 2-3 days after your procedure, or as long as directed: °? Avoid climbing stairs as much as possible. °? Do not squat. °· Do not lift anything that is heavier than 10 lb (4.5 kg), or the limit that you are told, until your health care provider says that it is safe. °· Rest as  directed. °? Avoid sitting for a long time without moving. Get up to take short walks every 1-2 hours. °· Do not drive for 24 hours if you were given a medicine to help you relax (sedative). °General instructions °· Take over-the-counter and prescription medicines only as told by your health care provider. °· Keep all follow-up visits as told by your health care provider. This is important. °Contact a health care provider if you have: °· A fever or chills. °· You have redness, swelling, or pain around your insertion site. °Get help right away if: °· The catheter insertion area swells very fast. °· You pass out. °· You suddenly start to sweat or your skin gets clammy. °· The catheter insertion area is bleeding, and the bleeding does not stop when you hold steady pressure on the area. °· The area near or just beyond the catheter insertion site becomes pale, cool, tingly, or numb. °These symptoms may represent a serious problem that is an emergency. Do not wait to see if the symptoms will go away. Get medical help right away. Call your local emergency services (911 in the [place] Do not drive yourself to the hospital. °Summary °· After the procedure, it is common to have bruising that usually fades within 1-2 weeks. °· Check your femoral site every day for signs of infection. °· Do not lift anything that is heavier than 10 lb (4.5 kg), or the   limit that you are told, until your health care provider says that it is safe. °This information is not intended to replace advice given to you by your health care provider. Make sure you discuss any questions you have with your health care provider. °Document Released: 06/23/2014 Document Revised: 11/02/2017 Document Reviewed: 11/02/2017 °Elsevier Patient Education © 2020 [employer] ° °

## 2019-08-01 ENCOUNTER — Encounter (HOSPITAL_COMMUNITY): Payer: Self-pay | Admitting: Vascular Surgery

## 2021-10-31 ENCOUNTER — Other Ambulatory Visit: Payer: Self-pay

## 2021-10-31 ENCOUNTER — Emergency Department (HOSPITAL_COMMUNITY): Payer: Medicaid Other

## 2021-10-31 ENCOUNTER — Encounter (HOSPITAL_COMMUNITY): Payer: Self-pay

## 2021-10-31 ENCOUNTER — Observation Stay (HOSPITAL_COMMUNITY)
Admission: EM | Admit: 2021-10-31 | Discharge: 2021-11-01 | Disposition: A | Discharge status: Home / Self Care | Payer: Medicaid Other | Attending: Internal Medicine | Admitting: Internal Medicine

## 2021-10-31 DIAGNOSIS — F1491 Cocaine use, unspecified, in remission: Secondary | ICD-10-CM | POA: Diagnosis present

## 2021-10-31 DIAGNOSIS — E785 Hyperlipidemia, unspecified: Secondary | ICD-10-CM | POA: Diagnosis present

## 2021-10-31 DIAGNOSIS — I10 Essential (primary) hypertension: Secondary | ICD-10-CM | POA: Diagnosis not present

## 2021-10-31 DIAGNOSIS — Z79899 Other long term (current) drug therapy: Secondary | ICD-10-CM | POA: Diagnosis not present

## 2021-10-31 DIAGNOSIS — F17210 Nicotine dependence, cigarettes, uncomplicated: Secondary | ICD-10-CM | POA: Diagnosis not present

## 2021-10-31 DIAGNOSIS — Z20822 Contact with and (suspected) exposure to covid-19: Secondary | ICD-10-CM | POA: Diagnosis not present

## 2021-10-31 DIAGNOSIS — I639 Cerebral infarction, unspecified: Principal | ICD-10-CM | POA: Diagnosis present

## 2021-10-31 DIAGNOSIS — Z7902 Long term (current) use of antithrombotics/antiplatelets: Secondary | ICD-10-CM | POA: Diagnosis not present

## 2021-10-31 DIAGNOSIS — F1410 Cocaine abuse, uncomplicated: Secondary | ICD-10-CM | POA: Diagnosis present

## 2021-10-31 DIAGNOSIS — F1011 Alcohol abuse, in remission: Secondary | ICD-10-CM | POA: Diagnosis not present

## 2021-10-31 DIAGNOSIS — R531 Weakness: Secondary | ICD-10-CM | POA: Diagnosis present

## 2021-10-31 DIAGNOSIS — G629 Polyneuropathy, unspecified: Secondary | ICD-10-CM

## 2021-10-31 DIAGNOSIS — F1010 Alcohol abuse, uncomplicated: Secondary | ICD-10-CM | POA: Diagnosis present

## 2021-10-31 LAB — COMPREHENSIVE METABOLIC PANEL
ALT: 25 U/L (ref 0–44)
AST: 23 U/L (ref 15–41)
Albumin: 3.4 g/dL — ABNORMAL LOW (ref 3.5–5.0)
Alkaline Phosphatase: 100 U/L (ref 38–126)
Anion gap: 8 (ref 5–15)
BUN: 9 mg/dL (ref 6–20)
CO2: 25 mmol/L (ref 22–32)
Calcium: 8.8 mg/dL — ABNORMAL LOW (ref 8.9–10.3)
Chloride: 105 mmol/L (ref 98–111)
Creatinine, Ser: 0.64 mg/dL (ref 0.61–1.24)
GFR, Estimated: >60 mL/min (ref >60)
Glucose, Bld: 92 mg/dL (ref 70–99)
Potassium: 3.9 mmol/L (ref 3.5–5.1)
Sodium: 138 mmol/L (ref 135–145)
Total Bilirubin: 0.5 mg/dL (ref 0.3–1.2)
Total Protein: 6.2 g/dL — ABNORMAL LOW (ref 6.5–8.1)

## 2021-10-31 LAB — ETHANOL: Alcohol, Ethyl (B): <10 mg/dL (ref <10)

## 2021-10-31 LAB — DIFFERENTIAL
Abs Immature Granulocytes: 0.04 10*3/uL (ref 0.00–0.07)
Basophils Absolute: 0.1 10*3/uL (ref 0.0–0.1)
Basophils Relative: 1 %
Eosinophils Absolute: 0.2 10*3/uL (ref 0.0–0.5)
Eosinophils Relative: 2 %
Immature Granulocytes: 0 %
Lymphocytes Relative: 33 %
Lymphs Abs: 3.6 10*3/uL (ref 0.7–4.0)
Monocytes Absolute: 1 10*3/uL (ref 0.1–1.0)
Monocytes Relative: 9 %
Neutro Abs: 5.9 10*3/uL (ref 1.7–7.7)
Neutrophils Relative %: 55 %

## 2021-10-31 LAB — CBC
HCT: 44 % (ref 39.0–52.0)
Hemoglobin: 14.7 g/dL (ref 13.0–17.0)
MCH: 31.2 pg (ref 26.0–34.0)
MCHC: 33.4 g/dL (ref 30.0–36.0)
MCV: 93.4 fL (ref 80.0–100.0)
Platelets: 238 10*3/uL (ref 150–400)
RBC: 4.71 MIL/uL (ref 4.22–5.81)
RDW: 13.5 % (ref 11.5–15.5)
WBC: 10.8 10*3/uL — ABNORMAL HIGH (ref 4.0–10.5)
nRBC: 0 % (ref 0.0–0.2)

## 2021-10-31 LAB — APTT: aPTT: 34 seconds (ref 24–36)

## 2021-10-31 LAB — CBG MONITORING, ED: Glucose-Capillary: 98 mg/dL (ref 70–99)

## 2021-10-31 LAB — PROTIME-INR
INR: 1.1 (ref 0.8–1.2)
Prothrombin Time: 14 seconds (ref 11.4–15.2)

## 2021-10-31 LAB — I-STAT CHEM 8, ED
BUN: 10 mg/dL (ref 6–20)
Calcium, Ion: 1.14 mmol/L — ABNORMAL LOW (ref 1.15–1.40)
Chloride: 103 mmol/L (ref 98–111)
Creatinine, Ser: 0.5 mg/dL — ABNORMAL LOW (ref 0.61–1.24)
Glucose, Bld: 87 mg/dL (ref 70–99)
HCT: 44 % (ref 39.0–52.0)
Hemoglobin: 15 g/dL (ref 13.0–17.0)
Potassium: 3.8 mmol/L (ref 3.5–5.1)
Sodium: 141 mmol/L (ref 135–145)
TCO2: 28 mmol/L (ref 22–32)

## 2021-10-31 MED ORDER — ASPIRIN 325 MG PO TABS
325.0000 mg | ORAL_TABLET | Freq: Every day | ORAL | Status: DC
  Administered 2021-10-31 – 2021-11-01 (×2): 325 mg via ORAL
  Filled 2021-10-31 (×2): qty 1

## 2021-10-31 MED ORDER — ASPIRIN 300 MG RE SUPP: 300.0000 mg | Freq: Every day | RECTAL | Status: DC

## 2021-10-31 MED ORDER — ACETAMINOPHEN 325 MG PO TABS
650.0000 mg | ORAL_TABLET | ORAL | Status: DC | PRN
  Filled 2021-10-31: qty 2

## 2021-10-31 MED ORDER — ACETAMINOPHEN 650 MG RE SUPP: 650.0000 mg | RECTAL | Status: DC | PRN

## 2021-10-31 MED ORDER — GABAPENTIN 300 MG PO CAPS
600.0000 mg | ORAL_CAPSULE | Freq: Every day | ORAL | Status: DC
  Administered 2021-10-31: 600 mg via ORAL
  Filled 2021-10-31: qty 2

## 2021-10-31 MED ORDER — HYDROCODONE-ACETAMINOPHEN 10-325 MG PO TABS
1.0000 | ORAL_TABLET | Freq: Four times a day (QID) | ORAL | Status: DC | PRN
  Administered 2021-11-01: 15:00:00 1 via ORAL
  Filled 2021-10-31: qty 1

## 2021-10-31 MED ORDER — SENNOSIDES-DOCUSATE SODIUM 8.6-50 MG PO TABS: 1.0000 | ORAL_TABLET | Freq: Every evening | ORAL | Status: DC | PRN

## 2021-10-31 MED ORDER — DICLOFENAC SODIUM 1 % TD GEL: 2.0000 g | Freq: Four times a day (QID) | TRANSDERMAL | Status: DC | PRN

## 2021-10-31 MED ORDER — DICLOFENAC EPOLAMINE 1.3 % TD PTCH: 1.0000 | MEDICATED_PATCH | TRANSDERMAL | Status: DC

## 2021-10-31 MED ORDER — STROKE: EARLY STAGES OF RECOVERY BOOK: Freq: Once | Status: DC

## 2021-10-31 MED ORDER — DICLOFENAC SODIUM 1 % EX GEL
2.0000 g | Freq: Four times a day (QID) | CUTANEOUS | Status: DC | PRN
Start: 2021-10-31 — End: 2021-11-01

## 2021-10-31 MED ORDER — ACETAMINOPHEN 160 MG/5ML PO SOLN: 650.0000 mg | ORAL | Status: DC | PRN

## 2021-10-31 MED ORDER — ENOXAPARIN SODIUM 40 MG/0.4ML IJ SOSY
40.0000 mg | PREFILLED_SYRINGE | INTRAMUSCULAR | Status: DC
  Administered 2021-10-31: 40 mg via SUBCUTANEOUS
  Filled 2021-10-31: qty 0.4

## 2021-10-31 MED ORDER — GABAPENTIN 300 MG PO CAPS
300.0000 mg | ORAL_CAPSULE | Freq: Two times a day (BID) | ORAL | Status: DC
  Administered 2021-11-01 (×2): 300 mg via ORAL
  Filled 2021-10-31 (×2): qty 1

## 2021-10-31 MED ORDER — ASPIRIN 325 MG PO TABS: 325.0000 mg | ORAL_TABLET | Freq: Every day | ORAL | Status: DC

## 2021-10-31 MED ORDER — DICLOFENAC EPOLAMINE 1.3 % EX PTCH: 1.0000 | MEDICATED_PATCH | Freq: Two times a day (BID) | CUTANEOUS | Status: DC | PRN

## 2021-10-31 MED ORDER — CLOPIDOGREL BISULFATE 75 MG PO TABS
75.0000 mg | ORAL_TABLET | Freq: Every day | ORAL | Status: DC
  Administered 2021-11-01: 12:00:00 75 mg via ORAL
  Filled 2021-10-31: qty 1

## 2021-10-31 MED ORDER — ATORVASTATIN CALCIUM 40 MG PO TABS
40.0000 mg | ORAL_TABLET | Freq: Every day | ORAL | Status: DC
  Administered 2021-11-01: 12:00:00 40 mg via ORAL
  Filled 2021-10-31: qty 1

## 2021-10-31 MED ORDER — FAMOTIDINE 20 MG PO TABS
20.0000 mg | ORAL_TABLET | Freq: Two times a day (BID) | ORAL | Status: DC
  Administered 2021-10-31 – 2021-11-01 (×2): 20 mg via ORAL
  Filled 2021-10-31 (×2): qty 1

## 2021-10-31 MED ORDER — IOHEXOL 350 MG/ML SOLN
100.0000 mL | Freq: Once | INTRAVENOUS | Status: AC | PRN
  Administered 2021-10-31: 22:00:00 100 mL via INTRAVENOUS

## 2021-10-31 NOTE — ED Notes (Signed)
Attempted report x1. "Bed not approved yet"

## 2021-10-31 NOTE — ED Notes (Signed)
R upper arm IV access noted to be infiltrated on return from  CT. IV access removed, arm elevated and compressive dsg applied. MD made aware of infiltratiom

## 2021-10-31 NOTE — ED Provider Notes (Signed)
[facility] EMERGENCY DEPARTMENT Provider Note   CSN: [id number] Arrival date & time: 10/31/21  2056  An emergency department physician performed an initial assessment on this suspected stroke patient at 2057.  History Chief Complaint  Patient presents with   Code Stroke    Hector Booker is a 60 y.o. male with past medical history significant for prior stroke without residual deficits, HLD, HTN who presents with left-sided weakness and slurred speech as a code stroke.  The patient had some mild weakness and felt off balance yesterday and then today developed worsening left-sided weakness as well as dysarthria.  Patient was unable to give a specific reason that is why he did not come to the hospital earlier when the symptoms first started.  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   Vertebral artery occlusion, 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   Hyperlipidemia 12/02/2016   Smoker 12/02/2016   Cocaine abuse ([place]) 12/02/2016   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    Past Surgical History:  Procedure Laterality Date   IR GENERIC HISTORICAL  12/02/2016   IR ANGIO VERTEBRAL SEL VERTEBRAL BILAT MOD SED 12/02/2016 Luanne Bras, MD [facility]   IR GENERIC HISTORICAL  12/02/2016   IR ANGIO INTRA EXTRACRAN SEL COM CAROTID INNOMINATE BILAT MOD SED 12/02/2016 Luanne Bras, MD [facility]   LOWER EXTREMITY ANGIOGRAPHY N/A 07/29/2019   Procedure: LOWER EXTREMITY ANGIOGRAPHY;  Surgeon: Angelia Mould, MD;  Location: [facility];  Service: Cardiovascular;  Laterality: N/A;       Family History  Problem Relation Age of Onset   Heart attack Father    Stroke Paternal Grandfather     Social History   Tobacco Use   Smoking status: Every Day    Packs/day: 0.25    Types: Cigars, Cigarettes   Smokeless tobacco: Never   Tobacco comments:    black mild 2 and three day smoke 4 per day  Vaping Use   Vaping Use: Never used  Substance Use Topics   Alcohol use: Yes    Alcohol/week: 1.0 standard drink    Types: 1 Cans of beer per week    Comment:  1 to  2 beers  weekly   Drug use: Not Currently    Types: Codeine, Cocaine    Comment: last use January 2019    Home Medications Prior to Admission medications   Medication Sig Start Date End Date Taking? Authorizing Provider  acetaminophen (TYLENOL) 650 MG CR tablet Take 650-1,300 mg by mouth every 8 (eight) hours as needed for pain.   Yes [provider]  atorvastatin (LIPITOR) 40 MG tablet Take 1 tablet (40 mg total) by mouth daily at 6 PM. Patient taking differently: Take 40 mg by mouth daily. 12/04/16  Yes Donzetta Starch, NP  clopidogrel (PLAVIX) 75 MG tablet Take 1 tablet (75 mg total) by mouth daily. 03/02/18  Yes Rosalin Hawking, MD  diclofenac (FLECTOR) 1.3 % PTCH Place 1 patch  onto the skin See admin instructions. Apply 1 patch to the left knee two times a day as needed for pain- remove old one first   Yes [provider]  diclofenac sodium (VOLTAREN) 1 % GEL Apply 2-4 g topically 4 (four) times daily as needed (for hand or knee pain).   Yes [provider]  famotidine (PEPCID) 20 MG tablet Take 1 tablet (20 mg total) by mouth 2 (two) times daily. 03/20/17  Yes Rancour, Annie Main, MD  gabapentin (NEURONTIN) 300 MG capsule Take 300-600 mg by mouth See admin instructions. Take 300 mg by mouth in the morning, 300 mg in the afternoon, and 600 mg at bedtime   Yes [provider]   HYDROcodone-acetaminophen (NORCO) 10-325 MG tablet Take 1 tablet by mouth See admin instructions. Take 1 tablet by mouth every 4-5 hours 03/19/17  Yes [provider]  lisinopril (ZESTRIL) 20 MG tablet Take 20 mg by mouth daily. 03/31/19  Yes [provider]  DULoxetine (CYMBALTA) 30 MG capsule Take 30 mg by mouth daily.    [provider]  gabapentin (NEURONTIN) 100 MG capsule TAKE ONE CAPSULE BY MOUTH TWICE A DAY FOR PAIN OR NEUROPATHY Patient not taking: Reported on 10/31/2021 12/15/17   Rosalin Hawking, MD    Allergies    Penicillins  Review of Systems   Review of Systems  Constitutional:  Negative for chills and fever.  HENT:  Negative for ear pain and sore throat.   Eyes:  Negative for pain and visual disturbance.  Respiratory:  Negative for cough and shortness of breath.   Cardiovascular:  Negative for chest pain and palpitations.  Gastrointestinal:  Negative for abdominal pain and vomiting.  Genitourinary:  Negative for dysuria and hematuria.  Musculoskeletal:  Negative for arthralgias and back pain.  Skin:  Negative for color change and rash.  Neurological:  Positive for speech difficulty and weakness. Negative for seizures and syncope.  All other systems reviewed and are negative.  Physical Exam Updated Vital Signs BP (!) 165/78    Pulse (!) 57    Temp 98.4 F (36.9 C) (Oral)    Resp 12    Wt 89.4 kg    SpO2 97%    BMI 28.28 kg/m   Physical Exam Vitals and nursing note reviewed.  Constitutional:      General: He is not in acute distress.    Appearance: He is well-developed.  HENT:     Head: Normocephalic and atraumatic.     Right Ear: External ear normal.     Left Ear: External ear normal.     Nose: Nose normal.     Mouth/Throat:     Mouth: Mucous membranes are moist.     Pharynx: Oropharynx is clear.  Eyes:     Extraocular Movements: Extraocular movements intact.     Conjunctiva/sclera: Conjunctivae normal.     Pupils: Pupils are equal,  round, and reactive to light.  Cardiovascular:     Rate and Rhythm: Normal rate and regular rhythm.     Heart sounds: Normal heart sounds. No murmur heard. Pulmonary:     Effort: Pulmonary effort is normal. No respiratory distress.     Breath sounds: Normal breath sounds.  Abdominal:     Palpations: Abdomen is soft.     Tenderness: There is no abdominal tenderness.  Musculoskeletal:        General: No swelling.     Cervical back: Neck supple.  Skin:    General: Skin is warm  and dry.     Capillary Refill: Capillary refill takes less than 2 seconds.  Neurological:     Mental Status: He is alert and oriented to person, place, and time.     GCS: GCS eye subscore is 4. GCS verbal subscore is 5. GCS motor subscore is 6.     Cranial Nerves: Dysarthria present.     Motor: Weakness (LUE and LLE 4-/5) present.  Psychiatric:        Mood and Affect: Mood normal.    ED Results / Procedures / Treatments   Labs (all labs ordered are listed, but only abnormal results are displayed) Labs Reviewed  CBC - Abnormal; Notable for the following components:      Result Value   WBC 10.8 (*)    All other components within normal limits  COMPREHENSIVE METABOLIC PANEL - Abnormal; Notable for the following components:   Calcium 8.8 (*)    Total Protein 6.2 (*)    Albumin 3.4 (*)    All other components within normal limits  I-STAT CHEM 8, ED - Abnormal; Notable for the following components:   Creatinine, Ser 0.50 (*)    Calcium, Ion 1.14 (*)    All other components within normal limits  RESP PANEL BY RT-PCR (FLU A&B, COVID) ARPGX2  ETHANOL  PROTIME-INR  APTT  DIFFERENTIAL  RAPID URINE DRUG SCREEN, HOSP PERFORMED  URINALYSIS, ROUTINE W REFLEX MICROSCOPIC  HIV ANTIBODY (ROUTINE TESTING W REFLEX)  HEMOGLOBIN A1C  LIPID PANEL  CBG MONITORING, ED    EKG None  Radiology CT HEAD CODE STROKE WO CONTRAST  Result Date: 10/31/2021 CLINICAL DATA:  Code stroke. Acute neuro deficit. Left facial  droop. EXAM: CT HEAD WITHOUT CONTRAST TECHNIQUE: Contiguous axial images were obtained from the base of the skull through the vertex without intravenous contrast. COMPARISON:  CT head 03/30/2018.  MRI head 01/29/2017 FINDINGS: Brain: Hypodensity right temporoparietal lobe compatible with chronic infarct. Ventricle size normal. Negative for acute infarct, hemorrhage, mass Vascular: Negative for hyperdense vessel Skull: Negative Sinuses/Orbits: Mucosal edema right maxillary sinus. Remaining sinuses clear. Negative orbit Other: None ASPECTS ([place] Stroke Program Early CT Score) - Ganglionic level infarction (caudate, lentiform nuclei, internal capsule, insula, M1-M3 cortex): 7 - Supraganglionic infarction (M4-M6 cortex): 3 Total score (0-10 with 10 being normal): 10 IMPRESSION: 1. No acute abnormality. Chronic infarct right temporoparietal lobe. 2. ASPECTS is 10 3. Code stroke imaging results were communicated on 10/31/2021 at 9:13 pm to provider [place] via text page Electronically Signed   By: Charles  Clark M.D.   On: 10/31/2021 21:20   CT ANGIO HEAD NECK W WO CM W PERF (CODE STROKE)  Result Date: 10/31/2021 CLINICAL DATA:  Left-sided weakness EXAM: CT ANGIOGRAPHY HEAD AND NECK CT PERFUSION BRAIN TECHNIQUE: Multidetector CT imaging of the head and neck was performed using the standard protocol during bolus administration of intravenous contrast. Multiplanar CT image reconstructions and MIPs were obtained to evaluate the vascular anatomy. Carotid stenosis measurements (when applicable) are obtained utilizing NASCET criteria, using the distal internal carotid diameter as the denominator. Multiphase CT imaging of the brain was performed following IV bolus contrast injection. Subsequent parametric perfusion maps were calculated using RAPID software. CONTRAST:  <[id number] 7Marland KitcKar<[id number])5Marland KitcKar<MEASUREMENT84936-4Marland KitcKar<MEASUREMENT98603-0Marland KitcKar<MEASUREMENT18612-0Marland KitcKar<MEASUREMENT37562-5Marland KitcKar<MEASUREMENT58541-1Marland KitcKar<MEASUREMENT12971-5Marland KitcKar<MEASUREMENT77614-7Marland KitcKar<MEASUREMENT64409-6Marland KitcKar<MEASUREMENT80(215)2Marland KitcKar<MEASUREMENT14971-1Marland KitcKar<MEASUREMENT21(618)5Marland KitcKarin Lieu279AQUE IOHEXOL 350 MG/ML SOLN COMPARISON:  None. FINDINGS: CTA NECK FINDINGS SKELETON: There is no bony spinal canal stenosis. No lytic or blastic  lesion. OTHER NECK: Normal pharynx, larynx and major salivary glands. No cervical lymphadenopathy. Unremarkable thyroid gland. UPPER CHEST: No pneumothorax or pleural effusion. No nodules or  masses. AORTIC ARCH: There is calcific atherosclerosis of the aortic arch. There is no aneurysm, dissection or hemodynamically significant stenosis of the visualized portion of the aorta. Conventional 3 vessel aortic branching pattern. The visualized proximal subclavian arteries are widely patent. RIGHT CAROTID SYSTEM: Normal without aneurysm, dissection or stenosis. LEFT CAROTID SYSTEM: Normal without aneurysm, dissection or stenosis. VERTEBRAL ARTERIES: Left dominant configuration. There is severe narrowing of the proximal right V2 segment. Otherwise, both vertebral arteries are normal to the skull base. CTA HEAD FINDINGS POSTERIOR CIRCULATION: --Vertebral arteries: Right V4 segment terminates in PICA. There is moderate atherosclerotic calcification. The dominant left V4 segment is normal. --Inferior cerebellar arteries: Normal. --Basilar artery: Normal. --Superior cerebellar arteries: Normal. --Posterior cerebral arteries (PCA): Normal. ANTERIOR CIRCULATION: --Intracranial internal carotid arteries: Normal. --Anterior cerebral arteries (ACA): Normal. Both A1 segments are present. Patent anterior communicating artery (a-comm). --Middle cerebral arteries (MCA): Normal. VENOUS SINUSES: As permitted by contrast timing, patent. ANATOMIC VARIANTS: None Review of the MIP images confirms the above findings. CT Brain Perfusion Findings: ASPECTS: 10 CBF (<30%) Volume: 69mL Perfusion (Tmax>6.0s) volume: 66mL Mismatch Volume: The above volumes are likely inaccurate as they correspond to areas of remote infarcted tissue. IMPRESSION: 1. No emergent large vessel occlusion. 2. Severe narrowing of the proximal right vertebral artery V2 segment. 3. Areas of ischemia demonstrated on CTP are likely artifacts of remotely infarcted tissue. Aortic  Atherosclerosis (ICD10-I70.0). Electronically Signed   By: Ulyses Jarred M.D.   On: 10/31/2021 21:57    Procedures Procedures   Medications Ordered in ED Medications  atorvastatin (LIPITOR) tablet 40 mg (has no administration in time range)  famotidine (PEPCID) tablet 20 mg (has no administration in time range)  clopidogrel (PLAVIX) tablet 75 mg (has no administration in time range)  gabapentin (NEURONTIN) capsule 300-600 mg (has no administration in time range)  diclofenac (FLECTOR) 1.3 % 1 patch (has no administration in time range)  diclofenac sodium (VOLTAREN) 1 % transdermal gel 2-4 g (has no administration in time range)   stroke: mapping our early stages of recovery book (has no administration in time range)  acetaminophen (TYLENOL) tablet 650 mg (has no administration in time range)    Or  acetaminophen (TYLENOL) 160 MG/5ML solution 650 mg (has no administration in time range)    Or  acetaminophen (TYLENOL) suppository 650 mg (has no administration in time range)  enoxaparin (LOVENOX) injection 40 mg (has no administration in time range)  senna-docusate (Senokot-S) tablet 1 tablet (has no administration in time range)  aspirin suppository 300 mg (has no administration in time range)    Or  aspirin tablet 325 mg (has no administration in time range)  iohexol (OMNIPAQUE) 350 MG/ML injection 100 mL (100 mLs Intravenous Contrast Given 10/31/21 2133)    ED Course  I have reviewed the triage vital signs and the nursing notes.  Pertinent labs & imaging results that were available during my care of the patient were reviewed by me and considered in my medical decision making (see chart for details).    MDM Rules/Calculators/A&P                          Patient presents as a code stroke for left-sided weakness and dysarthria.  Last known normal was sometime yesterday and the patient is outside the window for tPA or thrombectomy. Code stroke imaging demonstrates sequela of prior  stroke, but no evidence of acute infarct or LVO.  I discussed the patient with neurology stroke  and given that the patient has persistent deficits on exam he requires admission for further stroke work-up.  I discussed the patient with hospitalist medicine team on-call who will admit the patient.  Final Clinical Impression(s) / ED Diagnoses Final diagnoses:  None    Rx / DC Orders ED Discharge Orders     None        Anayia Eugene, Davy Pique, MD 10/31/21 2310    Gilda Crease, MD 11/06/21 [phone]

## 2021-10-31 NOTE — Consult Note (Addendum)
NEURO HOSPITALIST CONSULT NOTE   Requestig physician: Dr. Betsey Holiday  Reason for Consult: New onset of left sided weakness  History obtained from:  EMS, Patient, Wife and Chart     HPI:                                                                                                                                          Hector Booker is an 60 y.o. male with a PMHx of stroke without residual deficit, smoking, HLD and arthritis who presents to the ED as a Code Stroke with new onset of left sided weakness, left facial droop and slurred speech. Symptoms began on Wednesday afternoon with mild left sided weakness and feeling off balance. This progressed today to worsened left sided weakness with dysarthria. Wife states that patient resisted coming to be evaluated initially, but then was favorable to being brought in when his symptoms worsened. LKN was yesterday afternoon, but patient and wife were unable to further specify. He complains of headache in the left occipital region.   Evaluation in the ED revealed left facial droop, dysarthria and left hemiparesis. NIHSS = 5. mRS = 1.  Home meds include Plavix and atorvastatin. He takes ASA occasionally for pain.   Past Medical History:  Diagnosis Date   Arthritis    Hyperlipemia    Stroke [facility])     Past Surgical History:  Procedure Laterality Date   IR GENERIC HISTORICAL  12/02/2016   IR ANGIO VERTEBRAL SEL VERTEBRAL BILAT MOD SED 12/02/2016 Luanne Bras, MD [facility]   IR GENERIC HISTORICAL  12/02/2016   IR ANGIO INTRA EXTRACRAN SEL COM CAROTID INNOMINATE BILAT MOD SED 12/02/2016 Luanne Bras, MD [facility]   LOWER EXTREMITY ANGIOGRAPHY N/A 07/29/2019   Procedure: LOWER EXTREMITY ANGIOGRAPHY;  Surgeon: Angelia Mould, MD;  Location: [facility];  Service: Cardiovascular;  Laterality: N/A;    Family History  Problem Relation Age of Onset   Heart attack Father    Stroke Paternal Grandfather               Social History:  reports that he has been smoking cigars. He has been smoking an average of .25 packs per day. He has never used smokeless tobacco. He reports current alcohol use of about 1.0 standard drink per week. He reports that he does not currently use drugs after having used the following drugs: Codeine and Cocaine.  Allergies  Allergen Reactions   Penicillins Anaphylaxis    Has patient had a PCN reaction causing immediate rash, facial/tongue/throat swelling, SOB or lightheadedness with hypotension: Yes Has patient had a PCN reaction causing severe rash involving mucus membranes or skin necrosis: No Has patient had a PCN reaction that required hospitalization Yes Has patient had a PCN reaction  occurring within the last 10 years: No If all of the above answers are "NO", then may proceed with Cephalosporin use.     HOME MEDICATIONS:                                                                                                                      No current facility-administered medications on file prior to encounter.   Current Outpatient Medications on File Prior to Encounter  Medication Sig Dispense Refill   atorvastatin (LIPITOR) 40 MG tablet Take 1 tablet (40 mg total) by mouth daily at 6 PM. 30 tablet 2   clopidogrel (PLAVIX) 75 MG tablet Take 1 tablet (75 mg total) by mouth daily. 90 tablet 1   diclofenac (FLECTOR) 1.3 % PTCH Place 1 patch onto the skin 2 (two) times daily as needed (pain.).     diclofenac sodium (VOLTAREN) 1 % GEL Apply 2-4 g topically 4 (four) times daily as needed (hand/knee pain.).     famotidine (PEPCID) 20 MG tablet Take 1 tablet (20 mg total) by mouth 2 (two) times daily. 30 tablet 0   gabapentin (NEURONTIN) 100 MG capsule TAKE ONE CAPSULE BY MOUTH TWICE A DAY FOR PAIN OR NEUROPATHY (Patient taking differently: Take 100 mg by mouth 3 (three) times daily. ) 60 capsule 3   HYDROcodone-acetaminophen (NORCO) 10-325 MG tablet Take 1 tablet by mouth  every 6 (six) hours.      lisinopril (ZESTRIL) 20 MG tablet Take 20 mg by mouth every evening.       ROS:                                                                                                                                       As per HPI. Detailed ROS deferred due to acuity of presentation.   BP (!) 165/78    Pulse (!) 57    Temp 98.4 F (36.9 C) (Oral)    Resp 12    Wt 89.4 kg    SpO2 97%    BMI 28.28 kg/m    General Examination:  Physical Exam  HEENT-  [place]/AT   Lungs- Respirations unlabored Extremities- Warm and well perfused  Neurological Examination Mental Status: Awake and alert. Oriented x 5. Speech fluent with intact naming and comprehension. Mild dysarthria. Cranial Nerves: II: Temporal visual fields intact with no extinction to DSS. PERRL.   III,IV, VI: No ptosis. EOMI. No nystagmus.  V: Temp sensation equal bilaterally VII: Left facial droop VIII: Hearing intact to voice IX,X: No hypophonia or hoarseness. Palate elevates symmetrically  XI: Head is midline XII: Midline tongue extension Motor: RUE and RLE 5/5 LUE with bobbing drift, bradykinesia and 4/5 strength proximally and distally LLE with bobbing drift, slowed movements and 4/5 strength proximally and distally Sensory: Decreased temp sensation to LUE and LLE. FT intact x 4. No extinction to DSS.  Deep Tendon Reflexes:  1+ right biceps and brachioradialis 2+ left biceps and brachioradialis 4+ left patellar, 3+ right patellar 0 achilles bilaterally Right toe downgoing, left toe upgoing  Cerebellar: No ataxia with FNF bilaterally, but slow on the left. No ataxia with H-S bilaterally, but difficult to perform on the left.  Gait: Deferred due to falls risk concerns   Lab Results: Basic Metabolic Panel: Recent Labs  Lab 10/31/21 2106  NA 141  K 3.8  CL 103  GLUCOSE 87  BUN 10  CREATININE 0.50*     CBC: Recent Labs  Lab 10/31/21 2101 10/31/21 2106  WBC 10.8*  --   NEUTROABS 5.9  --   HGB 14.7 15.0  HCT 44.0 44.0  MCV 93.4  --   PLT 238  --     Cardiac Enzymes: No results for input(s): CKTOTAL, CKMB, CKMBINDEX, TROPONINI in the last 168 hours.  Lipid Panel: No results for input(s): CHOL, TRIG, HDL, CHOLHDL, VLDL, LDLCALC in the last 168 hours.  Imaging: No results found.  Assessment: 60 y.o. male with a PMHx of stroke without residual deficit, smoking, HLD and arthritis who presents to the ED as a Code Stroke with new onset of left sided weakness, left facial droop and slurred speech. Symptoms began on Wednesday afternoon with mild left sided weakness and feeling off balance. This progressed today to worsened left sided weakness with dysarthria. Wife states that patient resisted coming to be evaluated initially, but then was favorable to being brought in when his symptoms worsened. LKN was yesterday afternoon, but patient and wife were unable to further specify.  1. Examination reveals left facial droop, dysarthria and left hemiparesis. NIHSS = 5. Findings best localize as either a cortically-based right MCA territory stroke versus a lacunar infarction in the right basal ganglia, thalamus or internal capsule.  2. CT head: No acute intracranial abnormality.  3. CTA of head and neck: No emergent large vessel occlusion. Severe narrowing of the proximal right vertebral artery V2 segment.  4. CTP: Areas of ischemia demonstrated on CTP are likely artifacts of remotely infarcted tissue. 5. Home meds include Plavix and atorvastatin. He takes ASA occasionally for pain.    Recommendations: 1. HgbA1c, fasting lipid panel 2. MRI of the brain without contrast 3. PT consult, OT consult, Speech consult 4. Echocardiogram 5. Continue Plavix and atorvastatin. Start ASA.  6. Risk factor modification 7. Telemetry monitoring 8. Frequent neuro checks 9. NPO until passes stroke swallow  screen 10. Permissive HTN x 24 hours, followed by gradual reduction of BP by 15% per day to a long term goal of 120/80.    Electronically signed: Dr. Kerney Elbe 10/31/2021, 9:18 PM

## 2021-10-31 NOTE — ED Triage Notes (Signed)
Pt BIB [facility] for eval as a code stroke. Pt noted this AM reports at 0930 this AM, he noted L sided arm/leg weakness, L sided facial droop and slurred speech. Wife reports he was not at his baseline yesterday, reports stumbling gait and weakness.

## 2021-10-31 NOTE — H&P (Signed)
History and Physical   Hector Booker ZOX:[id number] DOB: [birth year] DOA: 10/31/2021  PCP: [name], MD   Patient coming from: Home  Chief Complaint: Neurologic deficits  HPI: Hector Booker is a 60 y.o. male with medical history significant of alcohol use, cocaine use, prior CVA, hypertension, hyperlipidemia presenting with left-sided weakness and facial droop.  Patient had some mild symptoms yesterday with abnormal gait and mild weakness.  At 9:30 AM this morning noted to have left-sided facial droop, left-sided arm and leg weakness and some slurred speech.    He denies fevers, chills, chest pain, shortness of breath, abdominal pain, constipation, diarrhea, nausea, vomiting   ED Course: Vital signs in the ED significant for heart rate in the 50s to 60s and blood pressure in the 150s 160s systolic.  Lab work-up showed CMP with calcium 8.8, protein 6.2, albumin 3.4.  CBC with mild leukocytosis 10.8.  PT, PTT, INR within normal limits.  Respiratory panel for flu and COVID pending.  UDS and urinalysis pending.  Ethanol level negative.  CT head showed no acute abnormality but did demonstrate chronic right infarct.  CTA of the head neck showed no large vessel occlusion, did show severe right P2 narrowing and evidence of remote infarct.  Neurology consulted in the ED and will be seeing the patient.  Review of Systems: As per HPI otherwise all other systems reviewed and are negative.  Past Medical History:  Diagnosis Date   Alcohol abuse 12/04/2016   Arthritis    Cocaine abuse (HCC) 12/02/2016   Hyperlipemia    Stroke [facility])     Past Surgical History:  Procedure Laterality Date   IR GENERIC HISTORICAL  12/02/2016   IR ANGIO VERTEBRAL SEL VERTEBRAL BILAT MOD SED 12/02/2016 Julieanne Cotton, MD [facility]   IR GENERIC HISTORICAL  12/02/2016   IR ANGIO INTRA EXTRACRAN SEL COM CAROTID INNOMINATE BILAT MOD SED 12/02/2016 Julieanne Cotton, MD [facility]   LOWER EXTREMITY ANGIOGRAPHY N/A  07/29/2019   Procedure: LOWER EXTREMITY ANGIOGRAPHY;  Surgeon: Chuck Hint, MD;  Location: [facility] INVASIVE CV LAB;  Service: Cardiovascular;  Laterality: N/A;    Social History  reports that he has been smoking cigars. He has been smoking an average of .25 packs per day. He has never used smokeless tobacco. He reports current alcohol use of about 1.0 standard drink per week. He reports that he does not currently use drugs after having used the following drugs: Codeine and Cocaine.  Allergies  Allergen Reactions   Penicillins Anaphylaxis    Has patient had a PCN reaction causing immediate rash, facial/tongue/throat swelling, SOB or lightheadedness with hypotension: Yes Has patient had a PCN reaction causing severe rash involving mucus membranes or skin necrosis: No Has patient had a PCN reaction that required hospitalization Yes Has patient had a PCN reaction occurring within the last 10 years: No If all of the above answers are "NO", then may proceed with Cephalosporin use.     Family History  Problem Relation Age of Onset   Heart attack Father    Stroke Paternal Grandfather   Reviewed on admission  Prior to Admission medications   Medication Sig Start Date End Date Taking? Authorizing Provider  acetaminophen (TYLENOL) 650 MG CR tablet Take 650-1,300 mg by mouth every 8 (eight) hours as needed for pain.   Yes [provider]  atorvastatin (LIPITOR) 40 MG tablet Take 1 tablet (40 mg total) by mouth daily at 6 PM. Patient taking differently: Take 40 mg  by mouth daily. 12/04/16  Yes Layne Benton, NP  clopidogrel (PLAVIX) 75 MG tablet Take 1 tablet (75 mg total) by mouth daily. 03/02/18  Yes Marvel Plan, MD  diclofenac (FLECTOR) 1.3 % PTCH Place 1 patch onto the skin See admin instructions. Apply 1 patch to the left knee two times a day as needed for pain- remove old one first   Yes [provider]  diclofenac sodium (VOLTAREN) 1 % GEL Apply 2-4 g topically 4 (four)  times daily as needed (for hand or knee pain).   Yes [provider]  famotidine (PEPCID) 20 MG tablet Take 1 tablet (20 mg total) by mouth 2 (two) times daily. 03/20/17  Yes Rancour, Jeannett Senior, MD  gabapentin (NEURONTIN) 300 MG capsule Take 300-600 mg by mouth See admin instructions. Take 300 mg by mouth in the morning, 300 mg in the afternoon, and 600 mg at bedtime   Yes [provider]  HYDROcodone-acetaminophen (NORCO) 10-325 MG tablet Take 1 tablet by mouth See admin instructions. Take 1 tablet by mouth every 4-5 hours 03/19/17  Yes [provider]  lisinopril (ZESTRIL) 20 MG tablet Take 20 mg by mouth daily. 03/31/19  Yes [provider]  DULoxetine (CYMBALTA) 30 MG capsule Take 30 mg by mouth daily.    [provider]  gabapentin (NEURONTIN) 100 MG capsule TAKE ONE CAPSULE BY MOUTH TWICE A DAY FOR PAIN OR NEUROPATHY Patient not taking: Reported on 10/31/2021 12/15/17   Marvel Plan, MD    Physical Exam: Vitals:   10/31/21 2145 10/31/21 2155 10/31/21 2156  BP: (!) 165/78    Pulse: (!) 57    Resp: 12    Temp:  98.4 F (36.9 C)   TempSrc:  Oral   SpO2: 97%    Weight:   89.4 kg   Physical Exam Constitutional:      General: He is not in acute distress.    Appearance: Normal appearance.  HENT:     Head: Normocephalic and atraumatic.     Mouth/Throat:     Mouth: Mucous membranes are moist.     Pharynx: Oropharynx is clear.  Eyes:     Extraocular Movements: Extraocular movements intact.     Pupils: Pupils are equal, round, and reactive to light.  Cardiovascular:     Rate and Rhythm: Normal rate and regular rhythm.     Pulses: Normal pulses.     Heart sounds: Normal heart sounds.  Pulmonary:     Effort: Pulmonary effort is normal. No respiratory distress.     Breath sounds: Normal breath sounds.  Abdominal:     General: Bowel sounds are normal. There is no distension.     Palpations: Abdomen is soft.     Tenderness: There is no abdominal  tenderness.  Musculoskeletal:        General: No swelling or deformity.  Skin:    General: Skin is warm and dry.  Neurological:     Comments: Mental Status: Patient is awake, alert, oriented x3 No signs of aphasia or neglect Cranial Nerves: II: Pupils equal, round, and reactive to light.   III,IV, VI: EOMI without ptosis or diploplia.  V: Facial sensation is symmetric to Temperature, decreased sensation to light touch on left. VII: Left-sided facial droop. VIII: hearing is intact to voice X: Uvula elevates symmetrically XI: Shoulder shrug is symmetric. XII: tongue is midline without atrophy or fasciculations.  Motor: Good effort thorughout, at Least 5/5 RUE, 3-4/5 LUE, 5/5 RLE, 3-4/5 LLE,  Sensory: Sensation is grossly intact bilateral UEs & LEs, but with sensation on the left upper and lower extremities feeling duller than the right Cerebellar: Finger-Nose intact bilalat     Labs on Admission: I have personally reviewed following labs and imaging studies  CBC: Recent Labs  Lab 10/31/21 2101 10/31/21 2106  WBC 10.8*  --   NEUTROABS 5.9  --   HGB 14.7 15.0  HCT 44.0 44.0  MCV 93.4  --   PLT 238  --     Basic Metabolic Panel: Recent Labs  Lab 10/31/21 2101 10/31/21 2106  NA 138 141  K 3.9 3.8  CL 105 103  CO2 25  --   GLUCOSE 92 87  BUN 9 10  CREATININE 0.64 0.50*  CALCIUM 8.8*  --     GFR: CrCl cannot be calculated (Unknown ideal weight.).  Liver Function Tests: Recent Labs  Lab 10/31/21 2101  AST 23  ALT 25  ALKPHOS 100  BILITOT 0.5  PROT 6.2*  ALBUMIN 3.4*    Urine analysis:    Component Value Date/Time   COLORURINE YELLOW 01/29/2017 1841   APPEARANCEUR CLEAR 01/29/2017 1841   LABSPEC 1.028 01/29/2017 1841   PHURINE 5.0 01/29/2017 1841   GLUCOSEU NEGATIVE 01/29/2017 1841   HGBUR NEGATIVE 01/29/2017 1841   BILIRUBINUR NEGATIVE 01/29/2017 1841   KETONESUR NEGATIVE 01/29/2017 1841   PROTEINUR NEGATIVE 01/29/2017 1841   NITRITE NEGATIVE  01/29/2017 1841   LEUKOCYTESUR NEGATIVE 01/29/2017 1841    Radiological Exams on Admission: CT HEAD CODE STROKE WO CONTRAST  Result Date: 10/31/2021 CLINICAL DATA:  Code stroke. Acute neuro deficit. Left facial droop. EXAM: CT HEAD WITHOUT CONTRAST TECHNIQUE: Contiguous axial images were obtained from the base of the skull through the vertex without intravenous contrast. COMPARISON:  CT head 03/30/2018.  MRI head 01/29/2017 FINDINGS: Brain: Hypodensity right temporoparietal lobe compatible with chronic infarct. Ventricle size normal. Negative for acute infarct, hemorrhage, mass Vascular: Negative for hyperdense vessel Skull: Negative Sinuses/Orbits: Mucosal edema right maxillary sinus. Remaining sinuses clear. Negative orbit Other: None ASPECTS ([place] Stroke Program Early CT Score) - Ganglionic level infarction (caudate, lentiform nuclei, internal capsule, insula, M1-M3 cortex): 7 - Supraganglionic infarction (M4-M6 cortex): 3 Total score (0-10 with 10 being normal): 10 IMPRESSION: 1. No acute abnormality. Chronic infarct right temporoparietal lobe. 2. ASPECTS is 10 3. Code stroke imaging results were communicated on 10/31/2021 at 9:13 pm to provider [place] via text page Electronically Signed   By: Marlan Palau M.D.   On: 10/31/2021 21:20   CT ANGIO HEAD NECK W WO CM W PERF (CODE STROKE)  Result Date: 10/31/2021 CLINICAL DATA:  Left-sided weakness EXAM: CT ANGIOGRAPHY HEAD AND NECK CT PERFUSION BRAIN TECHNIQUE: Multidetector CT imaging of the head and neck was performed using the standard protocol during bolus administration of intravenous contrast. Multiplanar CT image reconstructions and MIPs were obtained to evaluate the vascular anatomy. Carotid stenosis measurements (when applicable) are obtained utilizing NASCET criteria, using the distal internal carotid diameter as the denominator. Multiphase CT imaging of the brain was performed following IV bolus contrast injection. Subsequent parametric  perfusion maps were calculated using RAPID software. CONTRAST:  OMNIPAQUE IOHEXOL 350 MG/ML SOLN COMPARISON:  None. FINDINGS: CTA NECK FINDINGS SKELETON: There is no bony spinal canal stenosis. No lytic or blastic lesion. OTHER NECK: Normal pharynx, larynx and major salivary glands. No cervical lymphadenopathy. Unremarkable thyroid gland. UPPER CHEST: No pneumothorax or pleural effusion. No nodules or masses. AORTIC ARCH: There is calcific atherosclerosis of  the aortic arch. There is no aneurysm, dissection or hemodynamically significant stenosis of the visualized portion of the aorta. Conventional 3 vessel aortic branching pattern. The visualized proximal subclavian arteries are widely patent. RIGHT CAROTID SYSTEM: Normal without aneurysm, dissection or stenosis. LEFT CAROTID SYSTEM: Normal without aneurysm, dissection or stenosis. VERTEBRAL ARTERIES: Left dominant configuration. There is severe narrowing of the proximal right V2 segment. Otherwise, both vertebral arteries are normal to the skull base. CTA HEAD FINDINGS POSTERIOR CIRCULATION: --Vertebral arteries: Right V4 segment terminates in PICA. There is moderate atherosclerotic calcification. The dominant left V4 segment is normal. --Inferior cerebellar arteries: Normal. --Basilar artery: Normal. --Superior cerebellar arteries: Normal. --Posterior cerebral arteries (PCA): Normal. ANTERIOR CIRCULATION: --Intracranial internal carotid arteries: Normal. --Anterior cerebral arteries (ACA): Normal. Both A1 segments are present. Patent anterior communicating artery (a-comm). --Middle cerebral arteries (MCA): Normal. VENOUS SINUSES: As permitted by contrast timing, patent. ANATOMIC VARIANTS: None Review of the MIP images confirms the above findings. CT Brain Perfusion Findings: ASPECTS: 10 CBF (<30%) Volume: 48mL Perfusion (Tmax>6.0s) volume: 61mL Mismatch Volume: The above volumes are likely inaccurate as they correspond to areas of remote infarcted tissue.  IMPRESSION: 1. No emergent large vessel occlusion. 2. Severe narrowing of the proximal right vertebral artery V2 segment. 3. Areas of ischemia demonstrated on CTP are likely artifacts of remotely infarcted tissue. Aortic Atherosclerosis (ICD10-I70.0). Electronically Signed   By: Deatra Robinson M.D.   On: 10/31/2021 21:57    EKG: Independently reviewed.  Sinus rhythm at 56 bpm.  Assessment/Plan Principal Problem:   Acute CVA (cerebrovascular accident) [facility]) Active Problems:   Hyperlipidemia   Neuropathy   Essential hypertension   History of cocaine use   History of alcohol abuse  Acute stroke > Patient presenting with left-sided deficits including facial droop, left arm and leg weakness and slurred speech. > Outside of window for any tPA has initial symptoms that were more mild started yesterday. > History of prior stroke Plavix and statin. > Neurology consulted in the ED and are following. - Appreciate neurology recommendations - Allow for permissive HTN  (systolic < 220 and diastolic < 120) - ASA daily  - Continue home Plavix - Continue home atorvastatin - Echocardiogram  - A1C  - Lipid panel  - Tele monitoring  - SLP eval - PT/OT  Hypertension - Holding home lisinopril in the setting of permissive hypertension as above  Hyperlipidemia - Continue home atorvastatin  History of alcohol and cocaine use - Reports cessation. - Does still use tobacco products  Neuropathy Chronic pain - Continue home gabapentin - Continue home Norco  DVT prophylaxis: Lovenox  Code Status:   Full  Family Communication:  Wife updated at bedside Disposition Plan:   Patient is from:  Home  Anticipated DC to:  Home  Anticipated DC date:  1 to 2 days  Anticipated DC barriers: None  Consults called:  Neurology consulted by EDP, will see the patient  Admission status:  Observation, telemetry   Severity of Illness: The appropriate patient status for this patient is OBSERVATION. Observation  status is judged to be reasonable and necessary in order to provide the required intensity of service to ensure the patient's safety. The patient's presenting symptoms, physical exam findings, and initial radiographic and laboratory data in the context of their medical condition is felt to place them at decreased risk for further clinical deterioration. Furthermore, it is anticipated that the patient will be medically stable for discharge from the hospital within 2 midnights of admission.   Lyn Hollingshead  Elgie Collard MD [facility]  How to contact the [facility] Attending or Consulting provider 7A - 7P or covering provider during after hours 7P -7A, for this patient?   Check the care team in [facility] and look for a) attending/consulting [facility] provider listed and b) the [facility] team listed Log into [email or web address] and use [place]'s universal password to access. If you do not have the password, please contact the hospital operator. Locate the [facility] provider you are looking for under [facility] and page to a number that you can be directly reached. If you still have difficulty reaching the provider, please page the [facility] (Director on Call) for the Hospitalists listed on amion for assistance.  10/31/2021, 11:19 PM

## 2021-11-01 ENCOUNTER — Other Ambulatory Visit: Payer: Self-pay

## 2021-11-01 ENCOUNTER — Observation Stay (HOSPITAL_BASED_OUTPATIENT_CLINIC_OR_DEPARTMENT_OTHER): Payer: Medicaid Other

## 2021-11-01 ENCOUNTER — Observation Stay (HOSPITAL_COMMUNITY): Payer: Medicaid Other

## 2021-11-01 DIAGNOSIS — R2689 Other abnormalities of gait and mobility: Secondary | ICD-10-CM

## 2021-11-01 DIAGNOSIS — I6389 Other cerebral infarction: Secondary | ICD-10-CM

## 2021-11-01 DIAGNOSIS — I639 Cerebral infarction, unspecified: Secondary | ICD-10-CM | POA: Diagnosis not present

## 2021-11-01 DIAGNOSIS — I6322 Cerebral infarction due to unspecified occlusion or stenosis of basilar arteries: Secondary | ICD-10-CM

## 2021-11-01 DIAGNOSIS — G629 Polyneuropathy, unspecified: Secondary | ICD-10-CM | POA: Diagnosis not present

## 2021-11-01 LAB — RESP PANEL BY RT-PCR (FLU A&B, COVID) ARPGX2
Influenza A by PCR: NEGATIVE
Influenza B by PCR: NEGATIVE
SARS Coronavirus 2 by RT PCR: NEGATIVE

## 2021-11-01 LAB — RAPID URINE DRUG SCREEN, HOSP PERFORMED
Amphetamines: NOT DETECTED
Barbiturates: NOT DETECTED
Benzodiazepines: NOT DETECTED
Cocaine: NOT DETECTED
Opiates: POSITIVE — AB
Tetrahydrocannabinol: NOT DETECTED

## 2021-11-01 LAB — LIPID PANEL
Cholesterol: 124 mg/dL (ref 0–200)
HDL: 31 mg/dL — ABNORMAL LOW (ref >40)
LDL Cholesterol: 83 mg/dL (ref 0–99)
Total CHOL/HDL Ratio: 4 RATIO
Triglycerides: 52 mg/dL (ref <150)
VLDL: 10 mg/dL (ref 0–40)

## 2021-11-01 LAB — HIV ANTIBODY (ROUTINE TESTING W REFLEX): HIV Screen 4th Generation wRfx: NONREACTIVE

## 2021-11-01 LAB — ECHOCARDIOGRAM COMPLETE
Area-P 1/2: 2.71 cm2
Height: 68 in
P 1/2 time: 564 msec
S' Lateral: 3.7 cm
Weight: 3153.46 oz

## 2021-11-01 LAB — HEMOGLOBIN A1C
Hgb A1c MFr Bld: 5.4 % (ref 4.8–5.6)
Mean Plasma Glucose: 108.28 mg/dL

## 2021-11-01 MED ORDER — ASPIRIN 325 MG PO TABS: 325.0000 mg | ORAL_TABLET | Freq: Every day | ORAL | 0 refills | Status: AC

## 2021-11-01 NOTE — Progress Notes (Addendum)
STROKE TEAM PROGRESS NOTE   ATTENDING NOTE: I reviewed above note and agree with the assessment and plan. Pt was seen and examined.   60 year old male with history of hypertension, hyperlipidemia, stroke, cocaine and alcohol use admitted for left-sided weakness, left facial droop and slurred speech.  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  Status post tPA, EF 60 to 65%, DVT negative.  LDL 134, A1c 4.9, discharged on DAPT and Lipitor.  01/2017 similar symptoms admitted, but MRI and CT had an eye unremarkable.  UDS positive for cocaine.  30-day monitoring no A. fib.    He follows with Dr. Pearlean Brownie at [facility], last visit 01/2019.  On this admission, CT no acute abnormality, old large left parietal infarct however new from 2018.  MRI showed right pontine infarct.  CTA head and neck showed mild to moderate bilaterally stenosis, right V1 and V4 high-grade stenosis, likely ends up in PICA.  EF 55 to 60%, LDL 83, A1c 5.4, UDS positive for opiates only.  Creatinine 0.5, WBC 10.8.  On exam, patient awake alert, orientated x3.  No aphasia, fluent language, follows simple commands, able to name and repeat.  Slight dysarthria with left facial droop.  No gaze palsy, visual field full.  Tongue midline.  Muscle strength symmetrical bilaterally except slight decreased dexterity left hand and left ankle DF 5 -/5.  Sensation symmetrical.  No ataxia.  Etiology for patient current stroke more likely small vessel disease given the location and risk factors.  Recommend aspirin 325 and Plavix 75 DAPT for 3 months and then Plavix alone given bilateral stenosis.  Increase Lipitor from 40 to 80.  PT/OT recommend outpatient PT/OT.  Aggressive risk factor modification.  Continue abstinence from smoking or illicit drugs.  Patient expressed understanding.  For detailed assessment and plan, please refer to above as I have made changes wherever appropriate.   Neurology will sign off. Please call with questions. Pt will follow  up with stroke clinic NP at [facility] in about 4 weeks. Thanks for the consult.   Marvel Plan, MD PhD Stroke Neurology 11/01/2021 6:43 PM    INTERVAL HISTORY Patient is sitting up in the chair. He mentioned that he sees Dr. Edilia Bo outpatient and that he has an arterial clot in left fem and their are plans for stent and angioplasty with Dr. Edilia Bo "next year". 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 He also plans to have a knee replacement done. Patient has difficulty speaking and closing his mouth due to mouth/teeth pain. He is having his teeth removed and dentures fitted within the next few months. He has walked with PT they recommend outpatient follow up.  Vitals:   10/31/21 2315 11/01/21 0025 11/01/21 0058 11/01/21 0405  BP: (!) 178/83 (!) 161/84  (!) 142/69  Pulse: (!) 49 (!) 56  66  Resp: (!) 22 (!) 23  14  Temp:  (!) 97.5 F (36.4 C)  97.7 F (36.5 C)  TempSrc:  Oral  Oral  SpO2: 97% 96%  97%  Weight:   89.4 kg   Height:   5\' 8"  (1.727 m)    CBC:  Recent Labs  Lab 10/31/21 2101 10/31/21 2106  WBC 10.8*  --   NEUTROABS 5.9  --   HGB 14.7 15.0  HCT 44.0 44.0  MCV 93.4  --   PLT 238  --    Basic Metabolic Panel:  Recent Labs  Lab 10/31/21 2101 10/31/21 2106  NA 138 141  K 3.9  3.8  CL 105 103  CO2 25  --   GLUCOSE 92 87  BUN 9 10  CREATININE 0.64 0.50*  CALCIUM 8.8*  --    Lipid Panel:  Recent Labs  Lab 11/01/21 0247  CHOL 124  TRIG 52  HDL 31*  CHOLHDL 4.0  VLDL 10  LDLCALC 83   HgbA1c:  Recent Labs  Lab 11/01/21 0247  HGBA1C 5.4   Urine Drug Screen: No results for input(s): LABOPIA, COCAINSCRNUR, LABBENZ, AMPHETMU, THCU, LABBARB in the last 168 hours.  Alcohol Level  Recent Labs  Lab 10/31/21 2101  ETH <10    IMAGING past 24 hours MR BRAIN WO CONTRAST  Result Date: 11/01/2021 CLINICAL DATA:  Neuro deficit with acute stroke suspected. Substance abuse and prior CVA with left-sided weakness and facial droop. EXAM: MRI HEAD  WITHOUT CONTRAST TECHNIQUE: Multiplanar, multiecho pulse sequences of the brain and surrounding structures were obtained without intravenous contrast. COMPARISON:  Head CT from yesterday FINDINGS: Brain: Acute perforator infarct in the right pons. Remote inferior division right MCA distribution infarct primarily affecting the superficial temporal lobe. No hemorrhage, hydrocephalus, or collection. Vascular: Absent right V3 flow void which is likely from diminished flow when correlated with prior CTA. Skull and upper cervical spine: Negative marrow signal. Sinuses/Orbits: No emergent finding IMPRESSION: 1. Acute pontine infarct on the right. 2. Remote right inferior division MCA infarct. Electronically Signed   By: Tiburcio Pea M.D.   On: 11/01/2021 07:20   CT HEAD CODE STROKE WO CONTRAST  Result Date: 10/31/2021 CLINICAL DATA:  Code stroke. Acute neuro deficit. Left facial droop. EXAM: CT HEAD WITHOUT CONTRAST TECHNIQUE: Contiguous axial images were obtained from the base of the skull through the vertex without intravenous contrast. COMPARISON:  CT head 03/30/2018.  MRI head 01/29/2017 FINDINGS: Brain: Hypodensity right temporoparietal lobe compatible with chronic infarct. Ventricle size normal. Negative for acute infarct, hemorrhage, mass Vascular: Negative for hyperdense vessel Skull: Negative Sinuses/Orbits: Mucosal edema right maxillary sinus. Remaining sinuses clear. Negative orbit Other: None ASPECTS (Alberta Stroke Program Early CT Score) - Ganglionic level infarction (caudate, lentiform nuclei, internal capsule, insula, M1-M3 cortex): 7 - Supraganglionic infarction (M4-M6 cortex): 3 Total score (0-10 with 10 being normal): 10 IMPRESSION: 1. No acute abnormality. Chronic infarct right temporoparietal lobe. 2. ASPECTS is 10 3. Code stroke imaging results were communicated on 10/31/2021 at 9:13 pm to provider [place] via text page Electronically Signed   By: Marlan Palau M.D.   On: 10/31/2021 21:20    CT ANGIO HEAD NECK W WO CM W PERF (CODE STROKE)  Result Date: 10/31/2021 CLINICAL DATA:  Left-sided weakness EXAM: CT ANGIOGRAPHY HEAD AND NECK CT PERFUSION BRAIN TECHNIQUE: Multidetector CT imaging of the head and neck was performed using the standard protocol during bolus administration of intravenous contrast. Multiplanar CT image reconstructions and MIPs were obtained to evaluate the vascular anatomy. Carotid stenosis measurements (when applicable) are obtained utilizing NASCET criteria, using the distal internal carotid diameter as the denominator. Multiphase CT imaging of the brain was performed following IV bolus contrast injection. Subsequent parametric perfusion maps were calculated using RAPID software. CONTRAST:  OMNIPAQUE IOHEXOL 350 MG/ML SOLN COMPARISON:  None. FINDINGS: CTA NECK FINDINGS SKELETON: There is no bony spinal canal stenosis. No lytic or blastic lesion. OTHER NECK: Normal pharynx, larynx and major salivary glands. No cervical lymphadenopathy. Unremarkable thyroid gland. UPPER CHEST: No pneumothorax or pleural effusion. No nodules or masses. AORTIC ARCH: There is calcific atherosclerosis of the aortic arch. There is no  aneurysm, dissection or hemodynamically significant stenosis of the visualized portion of the aorta. Conventional 3 vessel aortic branching pattern. The visualized proximal subclavian arteries are widely patent. RIGHT CAROTID SYSTEM: Normal without aneurysm, dissection or stenosis. LEFT CAROTID SYSTEM: Normal without aneurysm, dissection or stenosis. VERTEBRAL ARTERIES: Left dominant configuration. There is severe narrowing of the proximal right V2 segment. Otherwise, both vertebral arteries are normal to the skull base. CTA HEAD FINDINGS POSTERIOR CIRCULATION: --Vertebral arteries: Right V4 segment terminates in PICA. There is moderate atherosclerotic calcification. The dominant left V4 segment is normal. --Inferior cerebellar arteries: Normal. --Basilar artery:  Normal. --Superior cerebellar arteries: Normal. --Posterior cerebral arteries (PCA): Normal. ANTERIOR CIRCULATION: --Intracranial internal carotid arteries: Normal. --Anterior cerebral arteries (ACA): Normal. Both A1 segments are present. Patent anterior communicating artery (a-comm). --Middle cerebral arteries (MCA): Normal. VENOUS SINUSES: As permitted by contrast timing, patent. ANATOMIC VARIANTS: None Review of the MIP images confirms the above findings. CT Brain Perfusion Findings: ASPECTS: 10 CBF (<30%) Volume: 59mL Perfusion (Tmax>6.0s) volume: 12mL Mismatch Volume: The above volumes are likely inaccurate as they correspond to areas of remote infarcted tissue. IMPRESSION: 1. No emergent large vessel occlusion. 2. Severe narrowing of the proximal right vertebral artery V2 segment. 3. Areas of ischemia demonstrated on CTP are likely artifacts of remotely infarcted tissue. Aortic Atherosclerosis (ICD10-I70.0). Electronically Signed   By: Deatra Robinson M.D.   On: 10/31/2021 21:57    PHYSICAL EXAM  Temp:  [97.5 F (36.4 C)-98.4 F (36.9 C)] 97.7 F (36.5 C) (12/30 0405) Pulse Rate:  [49-66] 56 (12/30 1212) Resp:  [12-23] 16 (12/30 1212) BP: (142-178)/(67-86) 156/74 (12/30 1212) SpO2:  [96 %-98 %] 98 % (12/30 1212) Weight:  [89.4 kg] 89.4 kg (12/30 0058)  General - Well nourished, well developed, in no apparent distress. Ophthalmologic - fundi not visualized due to noncooperation. Cardiovascular - Regular rhythm and rate.  Mental Status -  Level of arousal and orientation to time, place, and person were intact. Speech is at baseline, mildly dysarthric, difficulty speaking due to mouth pain.  Attention span and concentration were normal. Recent and remote memory were intact. Fund of Knowledge was assessed and was intact.  Cranial Nerves II - XII - II - Visual field intact OU. III, IV, VI - Extraocular movements intact. V - Facial sensation intact bilaterally. VII -mild left nasolabial  fold flattening. VIII - Hearing & vestibular intact bilaterally. X - Palate elevates symmetrically. XI - Chin turning & shoulder shrug intact bilaterally. XII - Tongue protrusion intact.  Motor Strength - The patients strength was normal in all extremities and pronator drift was absent.  Bulk was normal and fasciculations were absent.   Motor Tone - Muscle tone was assessed at the neck and appendages and was normal. Reflexes - The patients reflexes were symmetrical in all extremities and he had no pathological reflexes. Sensory - Light touch, temperature/pinprick were assessed and were symmetrical.   Coordination - The patient had normal movements in the hands and feet with no ataxia or dysmetria.  Tremor was absent. Gait and Station - deferred.   ASSESSMENT/PLAN Hector Booker is a 60 y.o. male with history of stroke without residual deficit, smoking, HLD and arthritis presenting with left weakness, left facial droop, and left hemiparesis.  He initially felt off balance on the 28th but did not come to the hospital.  His symptoms worsened on the 29th and then he decided to come in.  MRI shows an acute pontine infarct on the right and a  remote right inferior MCA infarct.  2D echo completed.  Current a carotid duplex in 2020 showed no significant narrowing of either carotid artery.  LDL is 84.  A1c 5.4 WBC elevated slightly at 10.8  Stroke:  Right pontine infarct likely secondary to small vessel disease Code Stroke CT No acute abnormality.  CTA head & neck right vertebral artery stenosis at V1 and V4 and ends at pica, mild Basilar artery stenosis  CT perfusion- Areas of ischemia demonstrated on CTP are likely artifacts of remotely infarcted tissue MRI  Acute pontine infarct on the right, remote right inferior division MCA infarct 2D Echo EF 55 to 60%.  Left ventricle has normal function.  No regional wall abnormalities LDL 83 HgbA1c 5.4 VTE prophylaxis - Lovenox Clopidogrel  daily  prior to admission, now on ASA  and plavix  for 3 months and then plavix alone.   Therapy recommendations:  Outpatient PT Disposition:  Pending  Hx of stroke 11/2016 left pontine infarct status post tPA, discharged on DAPT and Lipitor  Hypertension Home meds:  Lisinopril-not currently resumed Stable Permissive hypertension (OK if < 220/120) but gradually normalize in 5-7 days Long-term BP goal normotensive  Hyperlipidemia Home meds:  Atorvastatin , resumed in hospital LDL 83, goal < 70 High intensity statin not indicated  Continue statin at discharge  Other Stroke Risk Factors Former Cigarette smoker  ETOH use, alcohol level <10, advised to drink no more than 2 drink(s) a day Hx of cocaine use, UDS + opiates only Coronary artery disease  Other Active Problems Neuropathy Primary team to restart gabapentin and norco   Hospital day # 0  Patient seen and examined by NP/APP with MD. MD to update note as needed.   Elmer Picker, DNP, FNP-BC [facility] Pager: [phone]   To contact Stroke Continuity provider, please refer to [email or web address]. After hours, contact General Neurology

## 2021-11-01 NOTE — Progress Notes (Signed)
°  Echocardiogram 2D Echocardiogram has been performed.  Hector Booker 11/01/2021, 9:47 AM

## 2021-11-01 NOTE — Plan of Care (Signed)
°  Problem: Education: Goal: Knowledge of General Education information will improve Description: Including pain rating scale, medication(s)/side effects and non-pharmacologic comfort measures 11/01/2021 1546 by Deliah Boston, RN Outcome: Adequate for Discharge 11/01/2021 1546 by Deliah Boston, RN Outcome: Adequate for Discharge   Problem: Health Behavior/Discharge Planning: Goal: Ability to manage health-related needs will improve 11/01/2021 1546 by Deliah Boston, RN Outcome: Adequate for Discharge 11/01/2021 1546 by Deliah Boston, RN Outcome: Adequate for Discharge   Problem: Clinical Measurements: Goal: Ability to maintain clinical measurements within normal limits will improve 11/01/2021 1546 by Deliah Boston, RN Outcome: Adequate for Discharge 11/01/2021 1546 by Deliah Boston, RN Outcome: Progressing Goal: Will remain free from infection 11/01/2021 1546 by Deliah Boston, RN Outcome: Adequate for Discharge 11/01/2021 1546 by Deliah Boston, RN Outcome: Progressing Goal: Diagnostic test results will improve 11/01/2021 1546 by Deliah Boston, RN Outcome: Adequate for Discharge 11/01/2021 1546 by Deliah Boston, RN Outcome: Progressing Goal: Respiratory complications will improve 11/01/2021 1546 by Deliah Boston, RN Outcome: Adequate for Discharge 11/01/2021 1546 by Deliah Boston, RN Outcome: Progressing Goal: Cardiovascular complication will be avoided 11/01/2021 1546 by Deliah Boston, RN Outcome: Adequate for Discharge 11/01/2021 1546 by Deliah Boston, RN Outcome: Progressing   Problem: Activity: Goal: Risk for activity intolerance will decrease 11/01/2021 1546 by Deliah Boston, RN Outcome: Adequate for Discharge 11/01/2021 1546 by Deliah Boston, RN Outcome: Progressing   Problem: Nutrition: Goal: Adequate nutrition will be maintained 11/01/2021 1546 by Deliah Boston, RN Outcome: Adequate for Discharge 11/01/2021  1546 by Deliah Boston, RN Outcome: Progressing   Problem: Coping: Goal: Level of anxiety will decrease 11/01/2021 1546 by Deliah Boston, RN Outcome: Adequate for Discharge 11/01/2021 1546 by Deliah Boston, RN Outcome: Progressing   Problem: Elimination: Goal: Will not experience complications related to bowel motility 11/01/2021 1546 by Deliah Boston, RN Outcome: Adequate for Discharge 11/01/2021 1546 by Deliah Boston, RN Outcome: Progressing Goal: Will not experience complications related to urinary retention 11/01/2021 1546 by Deliah Boston, RN Outcome: Adequate for Discharge 11/01/2021 1546 by Deliah Boston, RN Outcome: Progressing   Problem: Pain Managment: Goal: General experience of comfort will improve 11/01/2021 1546 by Deliah Boston, RN Outcome: Adequate for Discharge 11/01/2021 1546 by Deliah Boston, RN Outcome: Progressing   Problem: Safety: Goal: Ability to remain free from injury will improve 11/01/2021 1546 by Deliah Boston, RN Outcome: Adequate for Discharge 11/01/2021 1546 by Deliah Boston, RN Outcome: Progressing   Problem: Skin Integrity: Goal: Risk for impaired skin integrity will decrease 11/01/2021 1546 by Deliah Boston, RN Outcome: Adequate for Discharge 11/01/2021 1546 by Deliah Boston, RN Outcome: Progressing   Problem: Education: Goal: Knowledge of disease or condition will improve 11/01/2021 1546 by Deliah Boston, RN Outcome: Adequate for Discharge 11/01/2021 1546 by Deliah Boston, RN Outcome: Progressing Goal: Knowledge of secondary prevention will improve (SELECT ALL) 11/01/2021 1546 by Deliah Boston, RN Outcome: Adequate for Discharge 11/01/2021 1546 by Deliah Boston, RN Outcome: Progressing Goal: Knowledge of patient specific risk factors will improve (INDIVIDUALIZE FOR PATIENT) 11/01/2021 1546 by Deliah Boston, RN Outcome: Adequate for Discharge 11/01/2021 1546 by Deliah Boston, RN Outcome: Progressing

## 2021-11-01 NOTE — Discharge Summary (Signed)
Physician Discharge Summary  Hector Booker KLK:[id number] DOB: [birth year] DOA: 10/31/2021  PCP: [name], MD  Admit date: 10/31/2021 Discharge date: 11/01/2021  Admitted From: Home Discharge disposition: Home   Code Status: Full Code   Discharge Diagnosis:   Principal Problem:   Acute CVA (cerebrovascular accident) [facility]) Active Problems:   Hyperlipidemia   Neuropathy   Essential hypertension   History of cocaine use   History of alcohol abuse   Chief Complaint  Patient presents with   Code Stroke    Brief narrative: Hector Booker is a 60 y.o. male with PMH significant for polysubstance abuse including alcohol, cocaine; HTN, HLD, prior CVA. Patient presented to the ED with left-sided weakness, facial droop and slurred speech.  Symptoms started on the afternoon of 12/28 with mild left-sided weakness and feeling off balance.  Symptoms progressed and patient also started having dysarthria.  In the ED, patient was noted to have left facial droop, dysarthria and left hemiparesis. CT head showed no acute abnormality but did demonstrate chronic right infarct.   CTA of the head neck showed no large vessel occlusion, did show severe right P2 narrowing and evidence of remote infarct.   MRI brain showed an acute pontine infarct in the right and remote right inferior division MCA infarct Admitted to hospitalist service.  Neurology consulted.     Subjective: Patient was seen and examined this morning.  Pleasant middle-aged Caucasian male.  Not in distress.  Says that his deficits are back to baseline.  Assessment/Plan: Acute stroke Prior history of stroke -Presented with left-sided deficits including facial droop, left arm and leg weakness and slurred speech. -Outside of window for any tPA has initial symptoms that were more mild started yesterday. -Imaging as above showing an acute right pontine infarct -Stroke work-up in progress -Echocardiogram with EF 55 to  60% -Neurology consult appreciated -PT/OT eval pending -A1c 5.4, HDL 31, LDL 83 -Prior to admission, patient was on Plavix and statin. -Per neurology, patient will be discharged on aspirin 325 mg daily and Plavix 75 mg daily for 3 months and after that patient will be on Plavix alone.   Hypertension -Continue lisinopril  Hyperlipidemia -Continue home atorvastatin  History of alcohol and cocaine use - Reports cessation. - Does still use tobacco products   Neuropathy Chronic pain - Continue home gabapentin - Continue home Norco  Discharge Medications:   Allergies as of 11/01/2021       Reactions   Penicillins Anaphylaxis   Has patient had a PCN reaction causing immediate rash, facial/tongue/throat swelling, SOB or lightheadedness with hypotension: Yes Has patient had a PCN reaction causing severe rash involving mucus membranes or skin necrosis: No Has patient had a PCN reaction that required hospitalization Yes Has patient had a PCN reaction occurring within the last 10 years: No If all of the above answers are "NO", then may proceed with Cephalosporin use.        Medication List     TAKE these medications    acetaminophen 650 MG CR tablet Commonly known as: TYLENOL Take 650-1,300 mg by mouth every 8 (eight) hours as needed for pain.   aspirin 325 MG tablet Take 1 tablet (325 mg total) by mouth daily. Start taking on: November 02, 2021   atorvastatin 40 MG tablet Commonly known as: LIPITOR Take 1 tablet (40 mg total) by mouth daily at 6 PM. What changed: when to take this   clopidogrel 75 MG tablet Commonly known as: PLAVIX Take  1 tablet (75 mg total) by mouth daily.   diclofenac 1.3 % Ptch Commonly known as: FLECTOR Place 1 patch onto the skin See admin instructions. Apply 1 patch to the left knee two times a day as needed for pain- remove old one first   diclofenac sodium 1 % Gel Commonly known as: VOLTAREN Apply 2-4 g topically 4 (four) times daily as  needed (for hand or knee pain).   famotidine 20 MG tablet Commonly known as: PEPCID Take 1 tablet (20 mg total) by mouth 2 (two) times daily.   gabapentin 300 MG capsule Commonly known as: NEURONTIN Take 300-600 mg by mouth See admin instructions. Take 300 mg by mouth in the morning, 300 mg in the afternoon, and 600 mg at bedtime What changed: Another medication with the same name was removed. Continue taking this medication, and follow the directions you see here.   HYDROcodone-acetaminophen 10-325 MG tablet Commonly known as: NORCO Take 1 tablet by mouth See admin instructions. Take 1 tablet by mouth every 4-5 hours   lisinopril 20 MG tablet Commonly known as: ZESTRIL Take 20 mg by mouth daily.        Wound care:     Discharge Instructions:   Discharge Instructions     Ambulatory referral to Neurology   Complete by: As directed    Follow up with stroke clinic NP (Jessica Vanschaick or Darrol Angel, if both not available, consider Manson Allan, or Ahern) at [facility] in about 4 weeks. Thanks.   Call MD for:  difficulty breathing, headache or visual disturbances   Complete by: As directed    Call MD for:  extreme fatigue   Complete by: As directed    Call MD for:  hives   Complete by: As directed    Call MD for:  persistant dizziness or light-headedness   Complete by: As directed    Call MD for:  persistant nausea and vomiting   Complete by: As directed    Call MD for:  severe uncontrolled pain   Complete by: As directed    Call MD for:  temperature >100.4   Complete by: As directed    Diet general   Complete by: As directed    Discharge instructions   Complete by: As directed    General discharge instructions:  Follow with Primary MD [name], MD in 7 days   Get CBC/BMP checked in next visit within 1 week by PCP or SNF MD. (We routinely change or add medications that can affect your baseline labs and fluid status, therefore we recommend that you get the  mentioned basic workup next visit with your PCP, your PCP may decide not to get them or add new tests based on their clinical decision)  On your next visit with your PCP, please get your medicines reviewed and adjusted.  Please request your PCP  to go over all hospital tests, procedures, radiology results at the follow up, please get all Hospital records sent to your PCP by signing hospital release before you go home.  Activity: As tolerated with Full fall precautions use walker/cane & assistance as needed  Avoid using any recreational substances like cigarette, tobacco, alcohol, or non-prescribed drug.  If you experience worsening of your admission symptoms, develop shortness of breath, life threatening emergency, suicidal or homicidal thoughts you must seek medical attention immediately by calling 911 or calling your MD immediately  if symptoms less severe.  You must read complete instructions/literature along with all the  possible adverse reactions/side effects for all the medicines you take and that have been prescribed to you. Take any new medicine only after you have completely understood and accepted all the possible adverse reactions/side effects.   Do not drive, operate heavy machinery, perform activities at heights, swimming or participation in water activities or provide baby sitting services if your were admitted for syncope or siezures until you have seen by Primary MD or a Neurologist and advised to do so again.  Do not drive when taking Pain medications.  Do not take more than prescribed Pain, Sleep and Anxiety Medications  Wear Seat belts while driving.  Please note You were cared for by a hospitalist during your hospital stay. If you have any questions about your discharge medications or the care you received while you were in the hospital after you are discharged, you can call the unit and asked to speak with the hospitalist on call if the hospitalist that took care of you is  not available. Once you are discharged, your primary care physician will handle any further medical issues. Please note that NO REFILLS for any discharge medications will be authorized once you are discharged, as it is imperative that you return to your primary care physician (or establish a relationship with a primary care physician if you do not have one) for your aftercare needs so that they can reassess your need for medications and monitor your lab values.   Increase activity slowly   Complete by: As directed        Follow ups:    Follow-up Information     [facility]. Schedule an appointment as soon as possible for a visit in 1 month(s).   Specialty: Neurology Why: stroke clinic Contact information: [address] [place] [address] [phone]        [name], MD Follow up.   Specialty: Family Medicine Contact information: [address] [place] [address] [phone]                 Discharge Exam:   Vitals:   11/01/21 0058 11/01/21 0405 11/01/21 0900 11/01/21 1212  BP:  (!) 142/69  (!) 156/74  Pulse:  66  (!) 56  Resp:  14  16  Temp:  97.7 F (36.5 C)  98 F (36.7 C)  TempSrc:  Oral  Oral  SpO2:  97% 98% 98%  Weight: 89.4 kg     Height:  (1.727 m)       Body mass index is 29.97 kg/m.   General exam: Pleasant, middle-aged Caucasian male.  Not in physical distress Skin: No rashes, lesions or ulcers. HEENT: Atraumatic, normocephalic, no obvious bleeding Lungs: Clear to auscultation bilaterally CVS: Regular rate and rhythm, no murmur GI/Abd soft, nontender, nondistended, bowel sound present CNS: Alert, awake, oriented x3 Psychiatry: Mood appropriate Extremities: No pedal edema, no calf tenderness  Time coordinating discharge: 35 minutes   The results of significant diagnostics from this hospitalization (including imaging, microbiology, ancillary and laboratory) are listed below  for reference.    Procedures and Diagnostic Studies:   MR BRAIN WO CONTRAST  Result Date: 11/01/2021 CLINICAL DATA:  Neuro deficit with acute stroke suspected. Substance abuse and prior CVA with left-sided weakness and facial droop. EXAM: MRI HEAD WITHOUT CONTRAST TECHNIQUE: Multiplanar, multiecho pulse sequences of the brain and surrounding structures were obtained without intravenous contrast. COMPARISON:  Head CT from yesterday FINDINGS: Brain: Acute perforator infarct in the right  pons. Remote inferior division right MCA distribution infarct primarily affecting the superficial temporal lobe. No hemorrhage, hydrocephalus, or collection. Vascular: Absent right V3 flow void which is likely from diminished flow when correlated with prior CTA. Skull and upper cervical spine: Negative marrow signal. Sinuses/Orbits: No emergent finding IMPRESSION: 1. Acute pontine infarct on the right. 2. Remote right inferior division MCA infarct. Electronically Signed   By: Tiburcio Pea M.D.   On: 11/01/2021 07:20   ECHOCARDIOGRAM COMPLETE  Result Date: 11/01/2021    ECHOCARDIOGRAM REPORT   Patient Name:   Hector Booker Date of Exam: 11/01/2021 Medical Rec #:  [id number]      Height:       68.0 in Accession #:    [id number]     Weight:       197.1 lb Date of Birth:  Mar 12, 1961       BSA:          2.031 m Patient Age:    60 years       BP:           142/69 mmHg Patient Gender: M              HR:           63 bpm. Exam Location:  Inpatient Procedure: 2D Echo Indications:    stroke  History:        Patient has prior history of Echocardiogram examinations, most                 recent 12/02/2016. Risk Factors:Hypertension, Dyslipidemia and                 cocaine use.  Sonographer:    Delcie Roch RDCS Referring Phys: [id number] Cecille Po MELVIN IMPRESSIONS  1. Left ventricular ejection fraction, by estimation, is 55 to 60%. The left ventricle has normal function. The left ventricle has no regional wall motion  abnormalities. Left ventricular diastolic parameters were normal.  2. Right ventricular systolic function is normal. The right ventricular size is normal. Tricuspid regurgitation signal is inadequate for assessing PA pressure.  3. The mitral valve is grossly normal. Trivial mitral valve regurgitation. No evidence of mitral stenosis.  4. The aortic valve is tricuspid. There is mild calcification of the aortic valve. Aortic valve regurgitation is mild. No aortic stenosis is present.  5. The inferior vena cava is normal in size with greater than 50% respiratory variability, suggesting right atrial pressure of 3 mmHg. FINDINGS  Left Ventricle: Left ventricular ejection fraction, by estimation, is 55 to 60%. The left ventricle has normal function. The left ventricle has no regional wall motion abnormalities. The left ventricular internal cavity size was normal in size. There is  no left ventricular hypertrophy. Left ventricular diastolic parameters were normal. Right Ventricle: The right ventricular size is normal. No increase in right ventricular wall thickness. Right ventricular systolic function is normal. Tricuspid regurgitation signal is inadequate for assessing PA pressure. Left Atrium: Left atrial size was normal in size. Right Atrium: Right atrial size was normal in size. Pericardium: There is no evidence of pericardial effusion. Mitral Valve: The mitral valve is grossly normal. Trivial mitral valve regurgitation. No evidence of mitral valve stenosis. Tricuspid Valve: The tricuspid valve is grossly normal. Tricuspid valve regurgitation is not demonstrated. No evidence of tricuspid stenosis. Aortic Valve: The aortic valve is tricuspid. There is mild calcification of the aortic valve. Aortic valve regurgitation is mild. Aortic regurgitation PHT measures 564 msec. No aortic stenosis is  present. Pulmonic Valve: The pulmonic valve was grossly normal. Pulmonic valve regurgitation is not visualized. No evidence of  pulmonic stenosis. Aorta: The aortic root and ascending aorta are structurally normal, with no evidence of dilitation. Venous: The inferior vena cava is normal in size with greater than 50% respiratory variability, suggesting right atrial pressure of 3 mmHg. IAS/Shunts: The atrial septum is grossly normal.  LEFT VENTRICLE PLAX 2D LVIDd:         5.40 cm   Diastology LVIDs:         3.70 cm   LV e' medial:    8.81 cm/s LV PW:         1.00 cm   LV E/e' medial:  7.8 LV IVS:        0.90 cm   LV e' lateral:   9.14 cm/s LVOT diam:     1.90 cm   LV E/e' lateral: 7.5 LV SV:         74 LV SV Index:   37 LVOT Area:     2.84 cm  RIGHT VENTRICLE             IVC RV S prime:     13.10 cm/s  IVC diam: 1.70 cm TAPSE (M-mode): 1.9 cm LEFT ATRIUM             Index        RIGHT ATRIUM           Index LA diam:        3.80 cm 1.87 cm/m   RA Area:     14.70 cm LA Vol (A2C):   60.4 ml 29.74 ml/m  RA Volume:   37.90 ml  18.66 ml/m LA Vol (A4C):   39.6 ml 19.50 ml/m LA Biplane Vol: 50.7 ml 24.96 ml/m  AORTIC VALVE LVOT Vmax:   105.00 cm/s LVOT Vmean:  64.000 cm/s LVOT VTI:    0.262 m AI PHT:      564 msec  AORTA Ao Root diam: 3.50 cm Ao Asc diam:  3.10 cm MITRAL VALVE MV Area (PHT): 2.71 cm    SHUNTS MV Decel Time: 280 msec    Systemic VTI:  0.26 m MV E velocity: 68.60 cm/s  Systemic Diam: 1.90 cm MV A velocity: 81.80 cm/s MV E/A ratio:  0.84 Lennie Odor MD Electronically signed by Lennie Odor MD Signature Date/Time: 11/01/2021/12:56:03 PM    Final    CT HEAD CODE STROKE WO CONTRAST  Result Date: 10/31/2021 CLINICAL DATA:  Code stroke. Acute neuro deficit. Left facial droop. EXAM: CT HEAD WITHOUT CONTRAST TECHNIQUE: Contiguous axial images were obtained from the base of the skull through the vertex without intravenous contrast. COMPARISON:  CT head 03/30/2018.  MRI head 01/29/2017 FINDINGS: Brain: Hypodensity right temporoparietal lobe compatible with chronic infarct. Ventricle size normal. Negative for acute infarct,  hemorrhage, mass Vascular: Negative for hyperdense vessel Skull: Negative Sinuses/Orbits: Mucosal edema right maxillary sinus. Remaining sinuses clear. Negative orbit Other: None ASPECTS (Alberta Stroke Program Early CT Score) - Ganglionic level infarction (caudate, lentiform nuclei, internal capsule, insula, M1-M3 cortex): 7 - Supraganglionic infarction (M4-M6 cortex): 3 Total score (0-10 with 10 being normal): 10 IMPRESSION: 1. No acute abnormality. Chronic infarct right temporoparietal lobe. 2. ASPECTS is 10 3. Code stroke imaging results were communicated on 10/31/2021 at 9:13 pm to provider [place] via text page Electronically Signed   By: Marlan Palau M.D.   On: 10/31/2021 21:20   CT ANGIO HEAD NECK W WO CM W  PERF (CODE STROKE)  Result Date: 10/31/2021 CLINICAL DATA:  Left-sided weakness EXAM: CT ANGIOGRAPHY HEAD AND NECK CT PERFUSION BRAIN TECHNIQUE: Multidetector CT imaging of the head and neck was performed using the standard protocol during bolus administration of intravenous contrast. Multiplanar CT image reconstructions and MIPs were obtained to evaluate the vascular anatomy. Carotid stenosis measurements (when applicable) are obtained utilizing NASCET criteria, using the distal internal carotid diameter as the denominator. Multiphase CT imaging of the brain was performed following IV bolus contrast injection. Subsequent parametric perfusion maps were calculated using RAPID software. CONTRAST:  OMNIPAQUE IOHEXOL 350 MG/ML SOLN COMPARISON:  None. FINDINGS: CTA NECK FINDINGS SKELETON: There is no bony spinal canal stenosis. No lytic or blastic lesion. OTHER NECK: Normal pharynx, larynx and major salivary glands. No cervical lymphadenopathy. Unremarkable thyroid gland. UPPER CHEST: No pneumothorax or pleural effusion. No nodules or masses. AORTIC ARCH: There is calcific atherosclerosis of the aortic arch. There is no aneurysm, dissection or hemodynamically significant stenosis of the visualized  portion of the aorta. Conventional 3 vessel aortic branching pattern. The visualized proximal subclavian arteries are widely patent. RIGHT CAROTID SYSTEM: Normal without aneurysm, dissection or stenosis. LEFT CAROTID SYSTEM: Normal without aneurysm, dissection or stenosis. VERTEBRAL ARTERIES: Left dominant configuration. There is severe narrowing of the proximal right V2 segment. Otherwise, both vertebral arteries are normal to the skull base. CTA HEAD FINDINGS POSTERIOR CIRCULATION: --Vertebral arteries: Right V4 segment terminates in PICA. There is moderate atherosclerotic calcification. The dominant left V4 segment is normal. --Inferior cerebellar arteries: Normal. --Basilar artery: Normal. --Superior cerebellar arteries: Normal. --Posterior cerebral arteries (PCA): Normal. ANTERIOR CIRCULATION: --Intracranial internal carotid arteries: Normal. --Anterior cerebral arteries (ACA): Normal. Both A1 segments are present. Patent anterior communicating artery (a-comm). --Middle cerebral arteries (MCA): Normal. VENOUS SINUSES: As permitted by contrast timing, patent. ANATOMIC VARIANTS: None Review of the MIP images confirms the above findings. CT Brain Perfusion Findings: ASPECTS: 10 CBF (<30%) Volume: 79mL Perfusion (Tmax>6.0s) volume: 4mL Mismatch Volume: The above volumes are likely inaccurate as they correspond to areas of remote infarcted tissue. IMPRESSION: 1. No emergent large vessel occlusion. 2. Severe narrowing of the proximal right vertebral artery V2 segment. 3. Areas of ischemia demonstrated on CTP are likely artifacts of remotely infarcted tissue. Aortic Atherosclerosis (ICD10-I70.0). Electronically Signed   By: Deatra Robinson M.D.   On: 10/31/2021 21:57     Labs:   Basic Metabolic Panel: Recent Labs  Lab 10/31/21 2101 10/31/21 2106  NA 138 141  K 3.9 3.8  CL 105 103  CO2 25  --   GLUCOSE 92 87  BUN 9 10  CREATININE 0.64 0.50*  CALCIUM 8.8*  --    GFR Estimated Creatinine Clearance:  106.7 mL/min (A) (by C-G formula based on SCr of 0.5 mg/dL (L)). Liver Function Tests: Recent Labs  Lab 10/31/21 2101  AST 23  ALT 25  ALKPHOS 100  BILITOT 0.5  PROT 6.2*  ALBUMIN 3.4*   No results for input(s): LIPASE, AMYLASE in the last 168 hours. No results for input(s): AMMONIA in the last 168 hours. Coagulation profile Recent Labs  Lab 10/31/21 2101  INR 1.1    CBC: Recent Labs  Lab 10/31/21 2101 10/31/21 2106  WBC 10.8*  --   NEUTROABS 5.9  --   HGB 14.7 15.0  HCT 44.0 44.0  MCV 93.4  --   PLT 238  --    Cardiac Enzymes: No results for input(s): CKTOTAL, CKMB, CKMBINDEX, TROPONINI in the last 168 hours. BNP: Invalid  input(s): POCBNP CBG: Recent Labs  Lab 10/31/21 2100  GLUCAP 98   D-Dimer No results for input(s): DDIMER in the last 72 hours. Hgb A1c Recent Labs    11/01/21 0247  HGBA1C 5.4   Lipid Profile Recent Labs    11/01/21 0247  CHOL 124  HDL 31*  LDLCALC 83  TRIG 52  CHOLHDL 4.0   Thyroid function studies No results for input(s): TSH, T4TOTAL, T3FREE, THYROIDAB in the last 72 hours.  Invalid input(s): FREET3 Anemia work up No results for input(s): VITAMINB12, FOLATE, FERRITIN, TIBC, IRON, RETICCTPCT in the last 72 hours. Microbiology Recent Results (from the past 240 hour(s))  Resp Panel by RT-PCR (Flu A&B, Covid) Nasopharyngeal Swab     Status: None   Collection Time: 10/31/21  9:00 PM   Specimen: Nasopharyngeal Swab; Nasopharyngeal(NP) swabs in vial transport medium  Result Value Ref Range Status   SARS Coronavirus 2 by RT PCR NEGATIVE NEGATIVE Final    Comment: (NOTE) SARS-CoV-2 target nucleic acids are NOT DETECTED.  The SARS-CoV-2 RNA is generally detectable in upper respiratory specimens during the acute phase of infection. The lowest concentration of SARS-CoV-2 viral copies this assay can detect is 138 copies/mL. A negative result does not preclude SARS-Cov-2 infection and should not be used as the sole basis for  treatment or other patient management decisions. A negative result may occur with  improper specimen collection/handling, submission of specimen other than nasopharyngeal swab, presence of viral mutation(s) within the areas targeted by this assay, and inadequate number of viral copies(<138 copies/mL). A negative result must be combined with clinical observations, patient history, and epidemiological information. The expected result is Negative.  Fact Sheet for Patients:  BloggerCourse.com  Fact Sheet for Healthcare Providers:  SeriousBroker.it  This test is no t yet approved or cleared by the [place] FDA and  has been authorized for detection and/or diagnosis of SARS-CoV-2 by FDA under an Emergency Use Authorization (EUA). This EUA will remain  in effect (meaning this test can be used) for the duration of the COVID-19 declaration under Section 564(b)(1) of the Act, 21 U.S.C.section 360bbb-3(b)(1), unless the authorization is terminated  or revoked sooner.       Influenza A by PCR NEGATIVE NEGATIVE Final   Influenza B by PCR NEGATIVE NEGATIVE Final    Comment: (NOTE) The Xpert Xpress SARS-CoV-2/FLU/RSV plus assay is intended as an aid in the diagnosis of influenza from Nasopharyngeal swab specimens and should not be used as a sole basis for treatment. Nasal washings and aspirates are unacceptable for Xpert Xpress SARS-CoV-2/FLU/RSV testing.  Fact Sheet for Patients: BloggerCourse.com  Fact Sheet for Healthcare Providers: SeriousBroker.it  This test is not yet approved or cleared by the [place] FDA and has been authorized for detection and/or diagnosis of SARS-CoV-2 by FDA under an Emergency Use Authorization (EUA). This EUA will remain in effect (meaning this test can be used) for the duration of the COVID-19 declaration under Section 564(b)(1) of the Act, 21  U.S.C. section 360bbb-3(b)(1), unless the authorization is terminated or revoked.  Performed at [facility], [address] [place], [place] [address]      Signed: Lorin Glass  [facility] 11/01/2021, 3:18 PM

## 2021-11-01 NOTE — Evaluation (Signed)
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   Vertebral artery occlusion, 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   Hyperlipidemia 12/02/2016   Smoker 12/02/2016   Left pontine stroke (HCC) s/p tPA 12/01/2016   Past Medical History:  Past Medical History:  Diagnosis Date   Alcohol abuse 12/04/2016   Arthritis    Cocaine abuse (HCC) 12/02/2016   Hyperlipemia    Stroke [facility])    Past Surgical History:  Past Surgical History:  Procedure Laterality Date   IR GENERIC HISTORICAL  12/02/2016   IR ANGIO VERTEBRAL SEL VERTEBRAL BILAT MOD SED 12/02/2016 Julieanne Cotton, MD [facility]   IR GENERIC HISTORICAL  12/02/2016   IR ANGIO INTRA EXTRACRAN SEL COM CAROTID INNOMINATE BILAT MOD SED 12/02/2016 Julieanne Cotton, MD [facility]   LOWER EXTREMITY ANGIOGRAPHY N/A 07/29/2019   Procedure: LOWER EXTREMITY ANGIOGRAPHY;  Surgeon: Chuck Hint, MD;  Location: [facility] INVASIVE CV LAB;  Service: Cardiovascular;  Laterality: N/A;   HPI:  Pt is a 60 y.o. male presenting with left-sided weakness and facial droop with some slurred speech. MRI 11/01/21 revealed acute R pontine infarct. Seen by acute SLP for SLE 12/03/2016, which revealed cognitive linguistic skills to be [facility]. PMH: alcohol use, cocaine use, prior CVA, hypertension, hyperlipidemia.   Assessment / Plan / Recommendation Clinical Impression  Pt presents with mild dysarthria post CVA marked primarily by imprecise articulation. He presents  with L facial asymmetry, which appears to impact speech intelligibility at the sentence-conversation level. During informal conversation he was 85-90% intelligible, with improvment to 100% given min-mod verbal cues for increase of vocal intensity and over articulation. Pt unaware of speech errors and he did not engage in self-monitoring/correcting. Expressive/receptive language and cognitive functions were [facility] for tasks assessed. Pt would benefit from SLP services to f/u acutely to continue training in strategies to improve speech intelligibility for functional everyday communication.    SLP Assessment  SLP Recommendation/Assessment: Patient needs continued Speech Lanaguage Pathology Services SLP Visit Diagnosis: Dysarthria and anarthria (R47.1)    Recommendations for follow up therapy are one component of a multi-disciplinary discharge planning process, led by the attending physician.  Recommendations may be updated based on patient status, additional functional criteria and insurance authorization.    Follow Up Recommendations  Other (comment) (OP SLP services recommended if dysarthria doesn't resolve as he recovers)    Assistance Recommended at Discharge  PRN  Functional Status Assessment Patient has had a recent decline in their functional status and demonstrates the ability to make significant improvements in function in a reasonable and predictable amount of time.  Frequency and Duration min 1 x/week  2 weeks      SLP Evaluation Cognition  Overall Cognitive Status: Within Functional Limits for tasks assessed Arousal/Alertness: Awake/alert Orientation Level: Oriented X4 Memory: Appears intact       Comprehension  Auditory Comprehension Overall Auditory Comprehension: Appears within functional limits for tasks assessed Visual Recognition/Discrimination Discrimination: Not tested Reading Comprehension Reading Status: Not tested    Expression Expression Primary Mode of  Expression: Verbal  Verbal Expression Overall Verbal Expression: Appears within functional limits for tasks assessed Written Expression Written Expression: Not tested   Oral / Motor  Oral Motor/Sensory Function Overall Oral Motor/Sensory Function: Mild impairment Facial ROM: Reduced left;Suspected CN VII (facial) dysfunction Facial Symmetry: Abnormal symmetry left;Suspected CN VII (facial) dysfunction Lingual Symmetry: Within Functional Limits Motor Speech Overall Motor Speech: Impaired Respiration: Within functional limits Phonation: Normal Articulation: Impaired Level of Impairment: Sentence Intelligibility: Intelligibility reduced Word: 75-100% accurate Phrase: 75-100% accurate Sentence: 75-100% accurate Conversation: 75-100% accurate Motor Planning: Witnin functional limits Motor Speech Errors: Unaware Effective Techniques: Over-articulate;Increased vocal intensity            Avie Echevaria, MA, CCC-SLP Acute Rehabilitation Services Office Number: [phone]  Paulette Blanch 11/01/2021, 3:31 PM

## 2021-11-01 NOTE — Evaluation (Signed)
Physical Therapy Evaluation Patient Details Name: Hector Booker MRN: [id number] DOB: December 05, 1960 Today's Date: 11/01/2021  History of Present Illness  Hector Booker is a 60 y.o. male with medical history significant of alcohol use, cocaine use, prior CVA, hypertension, hyperlipidemia presenting with left-sided weakness and facial droop. Imaging revealed R acute pontine infarct   Clinical Impression  Pt admitted with above. Pt with noted mild L UE and LE weakness however is functioning near baseline. Pt with good home set up and support. Pt amb with noted L LE antalgia however pt states "I have a bad knee and need a knee replacement". Acute PT to cont to follow pt to progress higher level balance.       Recommendations for follow up therapy are one component of a multi-disciplinary discharge planning process, led by the attending physician.  Recommendations may be updated based on patient status, additional functional criteria and insurance authorization.  Follow Up Recommendations No PT follow up (instructed pt at follow up if pt feels his L UE and LE still remain weak to ask for outpt PT referral)    Assistance Recommended at Discharge Intermittent Supervision/Assistance  Functional Status Assessment Patient has had a recent decline in their functional status and demonstrates the ability to make significant improvements in function in a reasonable and predictable amount of time.  Equipment Recommendations  None recommended by PT    Recommendations for Other Services       Precautions / Restrictions Precautions Precautions: Fall Precaution Comments: L sided weakness Restrictions Weight Bearing Restrictions: No      Mobility  Bed Mobility Overal bed mobility: Modified Independent             General bed mobility comments: HOB flat, no physical assist    Transfers Overall transfer level: Needs assistance Equipment used: None Transfers: Sit to/from Stand Sit to Stand:  Min guard           General transfer comment: no physical assist needed, no use of AD    Ambulation/Gait Ambulation/Gait assistance: Min guard Gait Distance (Feet): 162 Feet Assistive device: None Gait Pattern/deviations: Step-through pattern;Decreased stride length;Antalgic Gait velocity: dec Gait velocity interpretation: 1.31 - 2.62 ft/sec, indicative of limited community ambulator   General Gait Details: pt with noted L antalgia, pt states this is his baseline "I have a bad knee, I need a knee replacement". Pt with no overt episode of LOB  Stairs Stairs: Yes Stairs assistance: Min guard Stair Management: Alternating pattern;Two rails Number of Stairs: 3 General stair comments: pt steady with use of hand rails  Wheelchair Mobility    Modified Rankin (Stroke Patients Only) Modified Rankin (Stroke Patients Only) Pre-Morbid Rankin Score: No significant disability Modified Rankin: Slight disability     Balance Overall balance assessment: Mild deficits observed, not formally tested                                           Pertinent Vitals/Pain Pain Assessment: No/denies pain    Home Living Family/patient expects to be discharged to:: Private residence Living Arrangements: Spouse/significant other Available Help at Discharge: Family;Available 24 hours/day Type of Home: House Home Access: Stairs to enter Entrance Stairs-Rails: Can reach both Entrance Stairs-Number of Steps: 2   Home Layout: One level Home Equipment: Agricultural consultant (2 wheels);Cane - single point;Shower seat      Prior Function Prior Level of Function :  Independent/Modified Independent             Mobility Comments: on disability, would drive golf cart around and pick up trash, didn't use AD ADLs Comments: indep     Hand Dominance        Extremity/Trunk Assessment   Upper Extremity Assessment Upper Extremity Assessment: LUE deficits/detail LUE Deficits / Details:  mild weakness 4/5 grossly    Lower Extremity Assessment Lower Extremity Assessment: LLE deficits/detail LLE Deficits / Details: mild weakness, grossly 4/5    Cervical / Trunk Assessment Cervical / Trunk Assessment: Normal  Communication   Communication: No difficulties  Cognition Arousal/Alertness: Awake/alert Behavior During Therapy: WFL for tasks assessed/performed Overall Cognitive Status: Within Functional Limits for tasks assessed                                          General Comments General comments (skin integrity, edema, etc.): VSS, HR 55    Exercises     Assessment/Plan    PT Assessment Patient needs continued PT services  PT Problem List Decreased strength;Decreased activity tolerance;Decreased balance;Decreased mobility       PT Treatment Interventions DME instruction;Gait training;Stair training;Functional mobility training;Therapeutic activities;Therapeutic exercise;Balance training    PT Goals (Current goals can be found in the Care Plan section)  Acute Rehab PT Goals Patient Stated Goal: home PT Goal Formulation: With patient Time For Goal Achievement: 11/15/21 Potential to Achieve Goals: Good    Frequency Min 4X/week   Barriers to discharge        Co-evaluation               AM-PAC PT "6 Clicks" Mobility  Outcome Measure Help needed turning from your back to your side while in a flat bed without using bedrails?: None Help needed moving from lying on your back to sitting on the side of a flat bed without using bedrails?: None Help needed moving to and from a bed to a chair (including a wheelchair)?: None Help needed standing up from a chair using your arms (e.g., wheelchair or bedside chair)?: None Help needed to walk in hospital room?: A Little Help needed climbing 3-5 steps with a railing? : A Little 6 Click Score: 22    End of Session Equipment Utilized During Treatment: Gait belt Activity Tolerance: Patient  tolerated treatment well Patient left: in chair;with call bell/phone within reach;with chair alarm set Nurse Communication: Mobility status PT Visit Diagnosis: Unsteadiness on feet (R26.81);Muscle weakness (generalized) (M62.81);Difficulty in walking, not elsewhere classified (R26.2)    Time: 8299-3716 PT Time Calculation (min) (ACUTE ONLY): 16 min   Charges:   PT Evaluation $PT Eval Moderate Complexity: 1 Mod          [name], PT, DPT Acute Rehabilitation Services Pager #: [phone] Office #: [phone]   Iona Hansen 11/01/2021, 11:48 AM

## 2021-11-01 NOTE — Evaluation (Signed)
Occupational Therapy Evaluation Patient Details Name: Hector Booker MRN: [id number] DOB: 29-Sep-1961 Today's Date: 11/01/2021   History of Present Illness Hector Booker is a 60 y.o. male with medical history significant of alcohol use, cocaine use, prior CVA, hypertension, hyperlipidemia presenting with left-sided weakness and facial droop. Imaging revealed R acute pontine infarct   Clinical Impression   Pt admitted for concerns listed above. PTA pt reported that he was independent with all ADL's and functional mobility. At this time, pt presenting with mild LUE weakness/incoordination, however it doesn't affect his ability to complete his ADL's. He continues to be independent with all functional mobility and has no further OT needs. Acute OT will sign off.       Recommendations for follow up therapy are one component of a multi-disciplinary discharge planning process, led by the attending physician.  Recommendations may be updated based on patient status, additional functional criteria and insurance authorization.   Follow Up Recommendations  No OT follow up    Assistance Recommended at Discharge PRN  Functional Status Assessment  Patient has had a recent decline in their functional status and demonstrates the ability to make significant improvements in function in a reasonable and predictable amount of time.  Equipment Recommendations  None recommended by OT    Recommendations for Other Services       Precautions / Restrictions Precautions Precautions: Fall Precaution Comments: L sided weakness Restrictions Weight Bearing Restrictions: No      Mobility Bed Mobility Overal bed mobility: Modified Independent             General bed mobility comments: HOB flat, no physical assist    Transfers Overall transfer level: Needs assistance Equipment used: None Transfers: Sit to/from Stand Sit to Stand: Supervision           General transfer comment: no physical assist  needed, no use of AD      Balance Overall balance assessment: Mild deficits observed, not formally tested                                         ADL either performed or assessed with clinical judgement   ADL Overall ADL's : Modified independent                                       General ADL Comments: Pt able to complete all ADL's and functional mobility with no difficulties     Vision Baseline Vision/History: 1 Wears glasses Ability to See in Adequate Light: 0 Adequate Patient Visual Report: No change from baseline Vision Assessment?: No apparent visual deficits     Perception     Praxis      Pertinent Vitals/Pain Pain Assessment: No/denies pain     Hand Dominance     Extremity/Trunk Assessment Upper Extremity Assessment Upper Extremity Assessment: LUE deficits/detail LUE Deficits / Details: mild weakness 4/5 grossly   Lower Extremity Assessment Lower Extremity Assessment: LLE deficits/detail LLE Deficits / Details: mild weakness, grossly 4/5   Cervical / Trunk Assessment Cervical / Trunk Assessment: Normal   Communication Communication Communication: No difficulties   Cognition Arousal/Alertness: Awake/alert Behavior During Therapy: WFL for tasks assessed/performed Overall Cognitive Status: Within Functional Limits for tasks assessed  General Comments  VSS on RA, BP 185/105 MD aware, HR 56    Exercises     Shoulder Instructions      Home Living Family/patient expects to be discharged to:: Private residence Living Arrangements: Spouse/significant other Available Help at Discharge: Family;Available 24 hours/day Type of Home: House Home Access: Stairs to enter [employer] of Steps: 2 Entrance Stairs-Rails: Can reach both Home Layout: One level     Bathroom Shower/Tub: Producer, television/film/video: Standard     Home Equipment: Clinical biochemist (2 wheels);Cane - single point;Shower seat          Prior Functioning/Environment Prior Level of Function : Independent/Modified Independent             Mobility Comments: on disability, would drive golf cart around and pick up trash, didn't use AD ADLs Comments: indep        OT Problem List: Decreased strength;Impaired balance (sitting and/or standing)      OT Treatment/Interventions:      OT Goals(Current goals can be found in the care plan section) Acute Rehab OT Goals Patient Stated Goal: To go home OT Goal Formulation: With patient Time For Goal Achievement: 11/01/21 Potential to Achieve Goals: Good  OT Frequency:     Barriers to D/C:            Co-evaluation              AM-PAC OT "6 Clicks" Daily Activity     Outcome Measure Help from another person eating meals?: None Help from another person taking care of personal grooming?: None Help from another person toileting, which includes using toliet, bedpan, or urinal?: None Help from another person bathing (including washing, rinsing, drying)?: None Help from another person to put on and taking off regular upper body clothing?: None Help from another person to put on and taking off regular lower body clothing?: None 6 Click Score: 24   End of Session Nurse Communication: Mobility status  Activity Tolerance: Patient tolerated treatment well Patient left: in chair;with call bell/phone within reach;with family/visitor present  OT Visit Diagnosis: Muscle weakness (generalized) (M62.81)                Time: 1443-1540 OT Time Calculation (min): 20 min Charges:  OT General Charges $OT Visit: 1 Visit OT Evaluation $OT Eval Moderate Complexity: 1 Mod  Maryann Mccall H., OTR/L Acute Rehabilitation  Melburn Treiber Elane Lasaro Primm 11/01/2021, 8:09 PM

## 2021-11-01 NOTE — Progress Notes (Signed)
°  Transition of Care [facility]) Screening Note   Patient Details  Name: RISHIKESH KHACHATRYAN Date of Birth: 07-07-1961   Transition of Care [facility]) CM/SW Contact:    Kermit Balo, RN Phone Number: 11/01/2021, 1:59 PM    Transition of Care Department [facility]) has reviewed patient and no TOC needs have been identified at this time. We will continue to monitor patient advancement through interdisciplinary progression rounds. If new patient transition needs arise, please place a TOC consult.

## 2021-11-07 ENCOUNTER — Emergency Department (HOSPITAL_BASED_OUTPATIENT_CLINIC_OR_DEPARTMENT_OTHER): Payer: Medicaid Other

## 2021-11-07 ENCOUNTER — Emergency Department (HOSPITAL_COMMUNITY): Payer: Medicaid Other

## 2021-11-07 ENCOUNTER — Other Ambulatory Visit: Payer: Self-pay

## 2021-11-07 ENCOUNTER — Emergency Department (HOSPITAL_COMMUNITY)
Admission: EM | Admit: 2021-11-07 | Discharge: 2021-11-07 | Disposition: A | Discharge status: Home / Self Care | Payer: Medicaid Other | Attending: Emergency Medicine | Admitting: Emergency Medicine

## 2021-11-07 ENCOUNTER — Encounter (HOSPITAL_COMMUNITY): Payer: Self-pay | Admitting: Emergency Medicine

## 2021-11-07 DIAGNOSIS — G8192 Hemiplegia, unspecified affecting left dominant side: Secondary | ICD-10-CM | POA: Insufficient documentation

## 2021-11-07 DIAGNOSIS — I639 Cerebral infarction, unspecified: Secondary | ICD-10-CM | POA: Diagnosis not present

## 2021-11-07 DIAGNOSIS — Z5321 Procedure and treatment not carried out due to patient leaving prior to being seen by health care provider: Secondary | ICD-10-CM | POA: Insufficient documentation

## 2021-11-07 DIAGNOSIS — M79605 Pain in left leg: Secondary | ICD-10-CM | POA: Diagnosis not present

## 2021-11-07 LAB — CBC WITH DIFFERENTIAL/PLATELET
Abs Immature Granulocytes: 0.03 10*3/uL (ref 0.00–0.07)
Basophils Absolute: 0.1 10*3/uL (ref 0.0–0.1)
Basophils Relative: 1 %
Eosinophils Absolute: 0.2 10*3/uL (ref 0.0–0.5)
Eosinophils Relative: 1 %
HCT: 45.9 % (ref 39.0–52.0)
Hemoglobin: 15.5 g/dL (ref 13.0–17.0)
Immature Granulocytes: 0 %
Lymphocytes Relative: 25 %
Lymphs Abs: 2.6 10*3/uL (ref 0.7–4.0)
MCH: 30.8 pg (ref 26.0–34.0)
MCHC: 33.8 g/dL (ref 30.0–36.0)
MCV: 91.1 fL (ref 80.0–100.0)
Monocytes Absolute: 0.9 10*3/uL (ref 0.1–1.0)
Monocytes Relative: 9 %
Neutro Abs: 6.6 10*3/uL (ref 1.7–7.7)
Neutrophils Relative %: 64 %
Platelets: 242 10*3/uL (ref 150–400)
RBC: 5.04 MIL/uL (ref 4.22–5.81)
RDW: 13.1 % (ref 11.5–15.5)
WBC: 10.4 10*3/uL (ref 4.0–10.5)
nRBC: 0 % (ref 0.0–0.2)

## 2021-11-07 LAB — BASIC METABOLIC PANEL
Anion gap: 7 (ref 5–15)
BUN: 8 mg/dL (ref 6–20)
CO2: 27 mmol/L (ref 22–32)
Calcium: 8.8 mg/dL — ABNORMAL LOW (ref 8.9–10.3)
Chloride: 101 mmol/L (ref 98–111)
Creatinine, Ser: 0.48 mg/dL — ABNORMAL LOW (ref 0.61–1.24)
GFR, Estimated: >60 mL/min (ref >60)
Glucose, Bld: 84 mg/dL (ref 70–99)
Potassium: 3.7 mmol/L (ref 3.5–5.1)
Sodium: 135 mmol/L (ref 135–145)

## 2021-11-07 NOTE — ED Triage Notes (Signed)
Pt from home via [facility] with C/O left sided arm weakness and numbness. Pt also reports left sided facial droop and left leg weakness. PT reports these symptoms began on Saturday morning 11/02/21.

## 2021-11-07 NOTE — ED Provider Triage Note (Signed)
Emergency Medicine Provider Triage Evaluation Note  Hector Booker , a 61 y.o. male  was evaluated in triage.  Pt complains of left-sided weakness.  Patient states that he was recently admitted on 10/31/2021 for stroke.  He states that at that time he was having left-sided weakness however it had improved.  He states that on Tuesday he started not being able to walk or use his left arm.  He presented to primary care who instructed him to return to the emergency department as this could be a new stroke.  He states that he did not call on Tuesday because he "wanted to see if it got better on its own."  He also endorses numbness in the left leg.  He states that his left leg feels cold and he has decreased ability to dorsiflex.  When asked how long this has been going on he states "since Thursday."  Review of Systems  Positive: See above Negative:   Physical Exam  BP 132/81 (BP Location: Right Arm)    Pulse 60    Temp 99.1 F (37.3 C) (Oral)    Resp 14    SpO2 96%  Gen:   Awake, no distress   Resp:  Normal effort  MSK:    Other:  Patient with slight left-sided facial droop, slight tongue protrusion to the left.  He has decreased strength of shoulder flexion on the left.  Unable to move left arm unless he is picking it up with his right arm.  Decreased strength of the left lower extremity.  Inability to dorsiflex.  The left lower leg is cooler than the right with some mottling.  Unable to palpate a pulse.  Numbness to the left foot.  Medical Decision Making  Medically screening exam initiated at 4:38 PM.  Appropriate orders placed.  Delsa Bern was informed that the remainder of the evaluation will be completed by another provider, this initial triage assessment does not replace that evaluation, and the importance of remaining in the ED until their evaluation is complete.     Cristopher Peru, PA-C 11/07/21 [phone]

## 2021-11-07 NOTE — Progress Notes (Signed)
Lower extremity venous and abi has been completed.   Preliminary results in CV Proc.   Aundra Millet Burlin Mcnair 11/07/2021 5:20 PM

## 2021-11-07 NOTE — ED Notes (Signed)
Pt was called for vitals No answer 

## 2021-11-14 ENCOUNTER — Other Ambulatory Visit: Payer: Self-pay

## 2021-11-14 DIAGNOSIS — I739 Peripheral vascular disease, unspecified: Secondary | ICD-10-CM

## 2021-11-21 ENCOUNTER — Other Ambulatory Visit: Payer: Self-pay

## 2021-11-21 ENCOUNTER — Encounter: Payer: Self-pay | Admitting: Vascular Surgery

## 2021-11-21 ENCOUNTER — Ambulatory Visit (INDEPENDENT_AMBULATORY_CARE_PROVIDER_SITE_OTHER): Payer: Medicaid Other | Admitting: Vascular Surgery

## 2021-11-21 ENCOUNTER — Ambulatory Visit (HOSPITAL_COMMUNITY)
Admission: RE | Admit: 2021-11-21 | Discharge: 2021-11-21 | Disposition: A | Discharge status: Home / Self Care | Payer: Medicaid Other | Source: Ambulatory Visit | Attending: Vascular Surgery | Admitting: Vascular Surgery

## 2021-11-21 ENCOUNTER — Other Ambulatory Visit: Payer: Self-pay | Admitting: Vascular Surgery

## 2021-11-21 VITALS — BP 128/79 | HR 56 | Temp 98.2°F | Resp 20 | Ht 68.0 in | Wt 197.0 lb

## 2021-11-21 DIAGNOSIS — I739 Peripheral vascular disease, unspecified: Secondary | ICD-10-CM

## 2021-11-21 NOTE — Progress Notes (Signed)
REASON FOR VISIT:   Follow-up of peripheral arterial disease.  MEDICAL ISSUES:   PERIPHERAL ARTERIAL DISEASE: This patient has a known left external iliac artery occlusion.  We had previously considered him for a right to left femoral-femoral bypass.  He continues to have claudication of the left leg and also is wanting to have a knee replacement as he has significant osteoarthritis of the left knee.  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  Clearly now is not the time to reconsider bypass.  However, if he shows significant clinical improvement then I think it would be reasonable to reevaluate him for possible femoral-femoral bypass.  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  In addition we will get follow-up ABIs.  I also encouraged him to speak to his primary care physician about arranging rehab as he still has significant left-sided weakness.  I will see him back in 6 months.  He knows to call sooner if he has problems.  HPI:   Hector Booker is a pleasant 61 y.o. male who I last saw in September 2020.  He had presented with disabling claudication of the left lower extremity.  In addition he was being considered for knee replacement on the left.  He had no femoral pulse on the left.  He underwent an arteriogram on 07/29/2019 which showed that on the left side the external iliac artery was occluded just beyond its origin and reconstituted at the level of the inguinal ligament.  Thus he was not a candidate for endovascular approach.  Below that the common femoral, deep femoral, superficial femoral, popliteal, posterior tibial, and peroneal arteries were patent.  He had some severe diffuse disease in the anterior tibial artery which was occluded distally.  We discussed potentially doing a right to left femoral-femoral bypass.  He wanted to discuss this with his family further and then was  lost to follow-up.  Since I saw him last, he was admitted to the hospital on 10/31/2021 with an acute CVA.  He presented with left-sided weakness, a facial droop, and slurred speech.  CT of the head at that time showed a chronic infarct on the right.  MRI of the brain showed an acute pontine infarct on the right and a remote right inferior MCA infarct.  Of note, CT angiogram of the neck at that time showed no significant extracranial carotid disease.  He had small vessel disease and neurology felt that this is best treated medically.  Specifically, the patient had severe narrowing of the proximal right vertebral artery.  On my history, prior to the stroke he was continuing to have significant left lower extremity claudication.  He denies any rest pain.  He still having significant pain in his left knee from arthritis.  He smokes cigars.  He does have a history of alcohol abuse and cocaine use.  Past Medical History:  Diagnosis Date   Alcohol abuse 12/04/2016   Arthritis    Cocaine abuse (HCC) 12/02/2016   Hyperlipemia    Stroke (HCC)     Family History  Problem Relation Age of Onset   Heart attack Father    Stroke Paternal Grandfather     SOCIAL HISTORY: Social History   Tobacco Use   Smoking status: Every Day    Packs/day: 0.25    Types: Cigars, Cigarettes   Smokeless  tobacco: Never   Tobacco comments:    black mild 2 and three day smoke 4 per day  Substance Use Topics   Alcohol use: Yes    Alcohol/week: 1.0 standard drink    Types: 1 Cans of beer per week    Comment:  1 to  2 beers  weekly    Allergies  Allergen Reactions   Penicillins Anaphylaxis    Has patient had a PCN reaction causing immediate rash, facial/tongue/throat swelling, SOB or lightheadedness with hypotension: Yes Has patient had a PCN reaction causing severe rash involving mucus membranes or skin necrosis: No Has patient had a PCN reaction that required hospitalization Yes Has patient had a PCN reaction  occurring within the last 10 years: No If all of the above answers are "NO", then may proceed with Cephalosporin use.     Current Outpatient Medications  Medication Sig Dispense Refill   acetaminophen (TYLENOL) 650 MG CR tablet Take 650-1,300 mg by mouth every 8 (eight) hours as needed for pain.     aspirin 325 MG tablet Take 1 tablet (325 mg total) by mouth daily. 90 tablet 0   atorvastatin (LIPITOR) 40 MG tablet Take 1 tablet (40 mg total) by mouth daily at 6 PM. (Patient taking differently: Take 40 mg by mouth daily.) 30 tablet 2   clopidogrel (PLAVIX) 75 MG tablet Take 1 tablet (75 mg total) by mouth daily. 90 tablet 1   diclofenac (FLECTOR) 1.3 % PTCH Place 1 patch onto the skin See admin instructions. Apply 1 patch to the left knee two times a day as needed for pain- remove old one first     diclofenac sodium (VOLTAREN) 1 % GEL Apply 2-4 g topically 4 (four) times daily as needed (for hand or knee pain).     famotidine (PEPCID) 20 MG tablet Take 1 tablet (20 mg total) by mouth 2 (two) times daily. 30 tablet 0   gabapentin (NEURONTIN) 300 MG capsule Take 300-600 mg by mouth See admin instructions. Take 300 mg by mouth in the morning, 300 mg in the afternoon, and 600 mg at bedtime     HYDROcodone-acetaminophen (NORCO) 10-325 MG tablet Take 1 tablet by mouth See admin instructions. Take 1 tablet by mouth every 4-5 hours     lisinopril (ZESTRIL) 20 MG tablet Take 20 mg by mouth daily.     No current facility-administered medications for this visit.    REVIEW OF SYSTEMS:  [X]  denotes positive finding, [ ]  denotes negative finding Cardiac  Comments:  Chest pain or chest pressure:    Shortness of breath upon exertion:    Short of breath when lying flat:    Irregular heart rhythm:        Vascular    Pain in calf, thigh, or hip brought on by ambulation: x   Pain in feet at night that wakes you up from your sleep:  x   Blood clot in your veins:    Leg swelling:         Pulmonary     Oxygen at home:    Productive cough:     Wheezing:         Neurologic    Sudden weakness in arms or legs:  x   Sudden numbness in arms or legs:  x   Sudden onset of difficulty speaking or slurred speech: x   Temporary loss of vision in one eye:     Problems with dizziness:  Gastrointestinal    Blood in stool:     Vomited blood:         Genitourinary    Burning when urinating:     Blood in urine:        Psychiatric    Major depression:         Hematologic    Bleeding problems:    Problems with blood clotting too easily:        Skin    Rashes or ulcers:        Constitutional    Fever or chills:     PHYSICAL EXAM:   Vitals:   11/21/21 1135  BP: 128/79  Pulse: (!) 56  Resp: 20  Temp: 98.2 F (36.8 C)  SpO2: 93%  Weight: 197 lb (89.4 kg)  Height: 5\' 8"  (1.727 m)    GENERAL: The patient is a well-nourished male, in no acute distress. The vital signs are documented above. CARDIAC: There is a regular rate and rhythm.  VASCULAR: I do not detect carotid bruits. On the right side he has a palpable femoral pulse and a palpable but diminished dorsalis pedis and posterior tibial pulse. On the left side I cannot palpate a femoral pulse or pedal pulses.  He has monophasic Doppler signals in the left foot. PULMONARY: There is good air exchange bilaterally without wheezing or rales. ABDOMEN: Soft and non-tender with normal pitched bowel sounds.  I do not palpate an aneurysm. MUSCULOSKELETAL: There are no major deformities or cyanosis. NEUROLOGIC: He is in a wheelchair and has significant left upper extremity weakness and left lower extremity weakness. SKIN: There are no ulcers or rashes noted. PSYCHIATRIC: The patient has a normal affect.  DATA:    ARTERIAL DUPLEX: I have independently interpreted his arterial duplex scan today.  This shows that the common iliac artery is patent however the external iliac artery is occluded on the left.  There is monophasic flow  from the common femoral artery distally.  The common femoral, deep femoral, superficial femoral, popliteal, posterior tibial, and peroneal arteries are patent.  [facility] [phone]

## 2021-11-25 ENCOUNTER — Other Ambulatory Visit: Payer: Self-pay | Admitting: *Deleted

## 2021-11-25 DIAGNOSIS — I739 Peripheral vascular disease, unspecified: Secondary | ICD-10-CM

## 2021-11-28 ENCOUNTER — Encounter (HOSPITAL_COMMUNITY): Payer: Medicaid Other

## 2021-11-28 ENCOUNTER — Ambulatory Visit: Payer: Medicaid Other | Admitting: Vascular Surgery

## 2021-12-05 NOTE — Progress Notes (Signed)
[facility] [address] [place]. [place] [address] [phone]       HOSPITAL FOLLOW UP NOTE  [name] Date of Birth:  1961-01-21 Medical Record Number:  [id number]   Reason for Referral:  hospital stroke follow up    SUBJECTIVE:   CHIEF COMPLAINT:  Chief Complaint  Patient presents with   Follow-up    Rm 10, w wife. Here to f/y from recent hospital visit. Pt reports doing better. Still having difficulties walking. Pt reports hand feels like it wants to "work" but it Scientist, research (physical sciences). Pt does exercises at home and starts PT Monday. Swelling in L foot.     HPI:   Hector Booker is a 61 y.o. with PMHx of prior CVA, right vertebral artery occlusion, PAD with left external iliac artery occlusion and claudication, HTN, HLD, polysubstance abuse (cocaine, alcohol, tobacco), peripheral neuropathy and chronic pain who presented on 10/31/2021 with left sided weakness, facial droop and slurred speech noted afternoon of 12/29.  CT showed no acute changes but chronic right infarct. CTA head/neck showed severe narrowing on the right proximal vertebral artery V2 segment and evidence of remote infarct. Patient MRI of the brain did show an acute Pontine infarct on the right with remote right inferior division MCA infarct  Evaluated by Dr Cheral Marker. NIHSS 5, mRS 1.   He was discharged home on asa 325mg  daily and Plavix 75mg  daily for three months then continue Plavix only. He is already taking atorvastatin 40mg  daily.   He reported to PCP at follow up 11/05/2021 that when discharged from hospital, left sided symptoms had significantly improved. He stated that symptoms returned and was not able to walk. Weakness noted of left upper and lower extremity on exam. PCP advised ER eval for concerns of recurring stroke. His mom called PCP 11/07/2021 with concerns of patient having new left sided weakness and numbness, not present with stoke. ER eval advised. Patient refused. I see where he did go  but left before being seen. He reported to PCP 11/2021 that he was not taking asa.   Personally reviewed hospitalization pertinent progress notes, lab work and imaging.  PT/OT referrals were placed but two agencies were unable to see him. Last referral sent to [facility] 11/29/2021. Starts therapy Monday. He is currently able to walk short distances with no assistive devises, but requires a cane for longer distances. He endorses that his strength has gotten better since the hospitalization.  He continues to take the Lisinipril 20 mg daily. His BP was elevated today. He does smoke, but he endorses desire to quit.    PERTINENT IMAGING/LABS Per hospitalization 12/29-12/30/23 Code Stroke CT No acute abnormality.  CTA head & neck right vertebral artery stenosis at V1 and V4 and ends at pica, mild Basilar artery stenosis  CT perfusion- Areas of ischemia demonstrated on CTP are likely artifacts of remotely infarcted tissue MRI  Acute pontine infarct on the right, remote right inferior division MCA infarct 2D Echo EF 55 to 60%.  Left ventricle has normal function.  No regional wall abnormalities LDL 83   ROS:   14 system review of systems performed and negative with exception of those listed in HPI  PMH:  Past Medical History:  Diagnosis Date   Alcohol abuse 12/04/2016   Arthritis    Cocaine abuse ([place]) 12/02/2016   Hyperlipemia    Stroke (HCC)     PSH:  Past Surgical History:  Procedure Laterality Date   IR GENERIC  HISTORICAL  12/02/2016   IR ANGIO VERTEBRAL SEL VERTEBRAL BILAT MOD SED 12/02/2016 Luanne Bras, MD [facility]   IR GENERIC HISTORICAL  12/02/2016   IR ANGIO INTRA EXTRACRAN SEL COM CAROTID INNOMINATE BILAT MOD SED 12/02/2016 Luanne Bras, MD [facility]   LOWER EXTREMITY ANGIOGRAPHY N/A 07/29/2019   Procedure: LOWER EXTREMITY ANGIOGRAPHY;  Surgeon: Angelia Mould, MD;  Location: [place] [facility];  Service: Cardiovascular;  Laterality: N/A;     Social History:  Social History   Socioeconomic History   Marital status: Married    Spouse name: Not on file   Number of children: Not on file   Years of education: Not on file   Highest education level: Not on file  Occupational History   Not on file  Tobacco Use   Smoking status: Every Day    Packs/day: 0.25    Types: Cigars, Cigarettes   Smokeless tobacco: Never   Tobacco comments:    black mild 2 and three day smoke 4 per day  Vaping Use   Vaping Use: Never used  Substance and Sexual Activity   Alcohol use: Yes    Alcohol/week: 1.0 standard drink    Types: 1 Cans of beer per week    Comment:  1 to  2 beers  weekly   Drug use: Not Currently    Types: Codeine, Cocaine    Comment: last use January 2019   Sexual activity: Never  Other Topics Concern   Not on file  Social History Narrative   Not on file   Social Determinants of Health   Financial Resource Strain: Not on file  Food Insecurity: Not on file  Transportation Needs: Not on file  Physical Activity: Not on file  Stress: Not on file  Social Connections: Not on file  Intimate Partner Violence: Not on file    Family History:  Family History  Problem Relation Age of Onset   Heart attack Father    Stroke Paternal Grandfather     Medications:   Current Outpatient Medications on File Prior to Visit  Medication Sig Dispense Refill   acetaminophen (TYLENOL) 650 MG CR tablet Take 650-1,300 mg by mouth every 8 (eight) hours as needed for pain.     aspirin 325 MG tablet Take 1 tablet (325 mg total) by mouth daily. 90 tablet 0   atorvastatin (LIPITOR) 40 MG tablet Take 1 tablet (40 mg total) by mouth daily at 6 PM. (Patient taking differently: Take 40 mg by mouth daily.) 30 tablet 2   clopidogrel (PLAVIX) 75 MG tablet Take 1 tablet (75 mg total) by mouth daily. 90 tablet 1   diclofenac (FLECTOR) 1.3 % PTCH Place 1 patch onto the skin See admin instructions. Apply 1 patch to the left knee two times a day as  needed for pain- remove old one first     diclofenac sodium (VOLTAREN) 1 % GEL Apply 2-4 g topically 4 (four) times daily as needed (for hand or knee pain).     famotidine (PEPCID) 20 MG tablet Take 1 tablet (20 mg total) by mouth 2 (two) times daily. 30 tablet 0   gabapentin (NEURONTIN) 300 MG capsule Take 300-600 mg by mouth See admin instructions. Take 300 mg by mouth in the morning, 300 mg in the afternoon, and 600 mg at bedtime     HYDROcodone-acetaminophen (NORCO) 10-325 MG tablet Take 1 tablet by mouth See admin instructions. Take 1 tablet by mouth every 4-5 hours  lisinopril (ZESTRIL) 20 MG tablet Take 20 mg by mouth daily.     No current facility-administered medications on file prior to visit.    Allergies:   Allergies  Allergen Reactions   Penicillins Anaphylaxis    Has patient had a PCN reaction causing immediate rash, facial/tongue/throat swelling, SOB or lightheadedness with hypotension: Yes Has patient had a PCN reaction causing severe rash involving mucus membranes or skin necrosis: No Has patient had a PCN reaction that required hospitalization Yes Has patient had a PCN reaction occurring within the last 10 years: No If all of the above answers are "NO", then may proceed with Cephalosporin use.       OBJECTIVE:  Physical Exam  Vitals:   12/06/21 0936  BP: (!) 150/91  Pulse: 85  Weight: 172 lb 8 oz (78.2 kg)  Height: 5\' 8"  (1.727 m)   Body mass index is 26.23 kg/m. No results found.  No flowsheet data found.   General: well developed, well nourished, seated, in no evident distress Head: head normocephalic and atraumatic.   Neck: supple with no carotid or supraclavicular bruits Cardiovascular: regular rate and rhythm, no murmurs Musculoskeletal: no deformity Skin:  no rash/petichiae Vascular:  Normal pulses all extremities   Neurologic Exam Mental Status: Awake and fully alert.  Fluent speech and language.  Oriented to place and time. Recent and  remote memory intact. Attention span, concentration and fund of knowledge appropriate. Mood and affect appropriate.  Cranial Nerves: Fundoscopic exam reveals sharp disc margins. Pupils equal, briskly reactive to light. Extraocular movements full without nystagmus. Visual fields full to confrontation. Hearing intact. Facial sensation intact. Face, tongue, palate moves normally and symmetrically. Shoulder shrug asymmetric on the left. Motor: Normal bulk and tone. Normal strength in RUE and RLE. 4/5 strength in LLE and 2/5 strength in the LUE. Left foot drop Sensory.: intact to touch , pinprick , position and vibratory sensation on right upper and lower extremities and LUE. Decreased sensation to the LLE.  Coordination: Finger-to-nose and heel-to-shin performed accurately on the right. Ataxia with the left side. Gait and Station: Arises from chair with some difficulty. Stance is staggered. Gait is ataxic. Tandem walk and heel toe deferred.  Reflexes: 2+ and symmetric.  NIHSS  4 Modified Rankin  1   ASSESSMENT: Hector Booker is a 61 y.o. year old male recent discharged from hospital after suffering right pontine secondary to small vessel disease. Vascular risk factors include HTN, HLD, PAD, CAD, tobacco user, polysubstance abuse prior history of CVA, +opiates   PLAN:  Right Pontine Infarct : Residual deficit: Left side weakness. Continue aspirin 325 mg daily and clopidogrel 75 mg daily  and atorvastatin 40mg  QD for secondary stroke prevention.  Discussed secondary stroke prevention measures and importance of close PCP follow up for aggressive stroke risk factor management. I have gone over the pathophysiology of stroke, warning signs and symptoms, risk factors and their management in some detail with instructions to go to the closest emergency room for symptoms of concern. Also discussed signs and symptoms of bleeding with patient and strongly enforced to seeking medical attention if an event occurs  and he hits his head. HTN: BP goal <130/90. BP elevated today, continue lisinopril 20mg  QD per PCP, consider additional agents to reach BP goal. Patient will check BP at home daily and report readings to PCP.  HLD: LDL goal <70. Recent LDL 83. Continue atorvastatin 40mg  QD per PCP. Advised to focus on well balanced diet low in saturated fats  and carbohydrates.    Follow up in 4-6 months or call earlier if needed   CC:  [facility] provider: Dr. Leonie Man PCP: Hector Sacramento, MD    I spent 30 minutes of face-to-face and non-face-to-face time with patient.  This included previsit chart review including review of recent hospitalization, lab review, study review, order entry, electronic health record documentation, patient education regarding recent stroke including etiology, secondary stroke prevention measures and importance of managing stroke risk factors, residual deficits and typical recovery time and answered all other questions to patient satisfaction   Debbora Presto, [facility]  [facility] [address] [place], [place] [address] Phone [phone] Fax [phone]

## 2021-12-05 NOTE — Patient Instructions (Addendum)
Below is our plan:  We will continue current treatment plan. It is important to adhere to medication regimen. I recommend continuing aspirin 325mg  daily and Plavix 75mg  daily. Continue atorvastatin 40mg  daily. Continue taking the lisinopril and monitor your blood pressures at home. I recommend smoking cessation. Please do not drink alcohol in excess and avoid street drugs.   HTN: BP goal <130/90.  Stable on lisinopril 20mg  QD per PCP HLD: LDL goal <70. Recent LDL 83. Continue atorvastatin 40mg  QD per PCP.        A1c goal<7.0. your most recent reading was A1c 5.4.   Please make sure you are staying well hydrated. I recommend 50-60 ounces daily. Well balanced diet and regular exercise encouraged. Consistent sleep schedule with 6-8 hours recommended.   Please continue follow up with care team as directed.   Follow up with me in 4-6 months   You may receive a survey regarding today's visit. I encourage you to leave honest feed back as I do use this information to improve patient care. Thank you for seeing me today!   What happens when you have a stroke in your pons?  A stroke in the pons region of the brain can cause serious symptoms. These may include problems with balance and coordination, double vision, loss of sensation, and weakness in half the body. Pons strokes can lead to brain damage. They are diagnosed with a neurologic examination and imaging tests.

## 2021-12-06 ENCOUNTER — Ambulatory Visit (INDEPENDENT_AMBULATORY_CARE_PROVIDER_SITE_OTHER): Payer: Medicaid Other | Admitting: Family Medicine

## 2021-12-06 ENCOUNTER — Encounter: Payer: Self-pay | Admitting: Family Medicine

## 2021-12-06 VITALS — BP 150/91 | HR 85 | Ht 68.0 in | Wt 172.5 lb

## 2021-12-06 DIAGNOSIS — I10 Essential (primary) hypertension: Secondary | ICD-10-CM | POA: Diagnosis not present

## 2021-12-06 DIAGNOSIS — E7800 Pure hypercholesterolemia, unspecified: Secondary | ICD-10-CM

## 2021-12-06 DIAGNOSIS — I639 Cerebral infarction, unspecified: Secondary | ICD-10-CM

## 2021-12-06 DIAGNOSIS — F17200 Nicotine dependence, unspecified, uncomplicated: Secondary | ICD-10-CM | POA: Diagnosis not present

## 2021-12-09 ENCOUNTER — Encounter: Payer: Self-pay | Admitting: Family Medicine

## 2021-12-09 NOTE — Progress Notes (Signed)
I agree with the above plan 

## 2021-12-23 ENCOUNTER — Ambulatory Visit: Payer: Medicaid Other | Attending: Family Medicine

## 2021-12-23 ENCOUNTER — Other Ambulatory Visit: Payer: Self-pay

## 2021-12-23 DIAGNOSIS — R2681 Unsteadiness on feet: Secondary | ICD-10-CM | POA: Diagnosis present

## 2021-12-23 DIAGNOSIS — R262 Difficulty in walking, not elsewhere classified: Secondary | ICD-10-CM | POA: Diagnosis present

## 2021-12-23 DIAGNOSIS — M6281 Muscle weakness (generalized): Secondary | ICD-10-CM | POA: Diagnosis not present

## 2021-12-23 NOTE — Therapy (Signed)
[facility] [address], [address] [place], [place], [address] Phone: [phone]   Fax:  [phone]  Physical Therapy Evaluation  Patient Details  Name: Hector Booker MRN: [id number] Date of Birth: 1961-04-18 Referring Provider (PT): [name], MD   Encounter Date: 12/23/2021   PT End of Session - 12/23/21 1407     Visit Number 1    Number of Visits 16    Date for PT Re-Evaluation 02/17/22    Authorization Type UHC Medicaid    Authorization - Visit Number 1    Authorization - Number of Visits 27    Progress Note Due on Visit 10    PT Start Time 1408   late arrival   PT Stop Time 1445    PT Time Calculation (min) 37 min    Activity Tolerance Patient tolerated treatment well    Behavior During Therapy [facility] for tasks assessed/performed             Past Medical History:  Diagnosis Date   Alcohol abuse 12/04/2016   Arthritis    Cocaine abuse (HCC) 12/02/2016   Hyperlipemia    Stroke [facility])     Past Surgical History:  Procedure Laterality Date   IR GENERIC HISTORICAL  12/02/2016   IR ANGIO VERTEBRAL SEL VERTEBRAL BILAT MOD SED 12/02/2016 Julieanne Cotton, MD [facility]-INTER[facility] RAD   IR GENERIC HISTORICAL  12/02/2016   IR ANGIO INTRA EXTRACRAN SEL COM CAROTID INNOMINATE BILAT MOD SED 12/02/2016 Julieanne Cotton, MD [facility]   LOWER EXTREMITY ANGIOGRAPHY N/A 07/29/2019   Procedure: LOWER EXTREMITY ANGIOGRAPHY;  Surgeon: Chuck Hint, MD;  Location: [facility] INVASIVE CV LAB;  Service: Cardiovascular;  Laterality: N/A;    There were no vitals filed for this visit.    Subjective Assessment - 12/23/21 1409     Subjective Recent CVA on 10/31/22 with onset of LUE and LUE weakness and difficulty in walking and had been using a walker. Pt notes no rehab services since onset of CVA and has been having continued difficulty in walking, balance deviations, and limited use. Pt has been attempting to improve his LUE since outset.    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 No/denies                [facility] PT Assessment - 12/23/21 0001       Assessment   Medical Diagnosis CVA    Referring Provider (PT) [name], MD    Hand Dominance Left      Balance Screen   Has the patient fallen in the past 6 months No    Has the patient had a decrease in activity level because of a fear of falling?  No    Is the patient reluctant to leave their home because of a fear of falling?  No      Home Environment   Living Environment Private residence    Living Arrangements Alone    Available Help at Discharge Family    Type of Home Mobile home    Home Access Stairs to enter    Entrance Stairs-Number of Steps 4    Home  Layout One level    Magazine features editor - 2 wheels      Prior Function   Level of Independence Independent    Vocation Self employed    Insurance underwriter   Overall Cognitive Status Within Functional Limits for tasks assessed    Attention Focused    Memory Appears intact    Awareness Appears intact      Coordination   Gross Motor Movements are Fluid and Coordinated No    Fine Motor Movements are Fluid and Coordinated No    Finger Nose Finger Test impaired    Heel Shin Test impaired      Functional Tests   Functional tests Single leg stance      Single Leg Stance   Comments 10 sec RLE, unable LLE      Tone   Assessment Location Left Lower Extremity      Deep Tendon Reflexes   DTR Assessment Site Patella;Achilles    Patella DTR 3+    Achilles DTR 4+      ROM / Strength   AROM / PROM / Strength AROM;Strength      AROM   Overall AROM  Comments RLE limited by gross weakness      Strength   Strength Assessment Site Hip;Knee;Ankle    Right/Left Hip Right;Left    Right Hip Flexion 5/5    Right Hip ABduction 4/5    Right Hip ADduction 5/5    Left Hip Extension 3-/5    Left Hip ABduction 3-/5    Left Hip ADduction 3/5    Right/Left Knee Right;Left    Right Knee Flexion 5/5    Right Knee Extension 5/5    Left Knee Flexion 3/5    Left Knee Extension 3-/5    Right/Left Ankle Right;Left    Right Ankle Dorsiflexion 5/5    Right Ankle Plantar Flexion 4/5    Left Ankle Dorsiflexion 2-/5    Left Ankle Plantar Flexion 2/5      Transfers   Transfers Stand to Sit;Stand Pivot Transfers    Five time sit to stand comments  15 sec    Stand to Sit 6: Modified independent (Device/Increase time)    Stand Pivot Transfers 6: Modified independent (Device/Increase time)      Ambulation/Gait   Ambulation/Gait Yes    Ambulation/Gait Assistance 6: Modified independent (Device/Increase time)    Assistive device None    Gait Pattern Decreased step length - right;Decreased stance time - left;Left steppage    Gait velocity 2 ft/sec      Standardized Balance Assessment   Standardized Balance Assessment Timed Up and Go Test      Timed Up and Go Test   Normal TUG (seconds) 22      LLE Tone   LLE Tone Hypertonic;Moderate;Modified Ashworth      LLE Tone   Modified Ashworth Scale for Grading Hypertonia LLE More marked increase in muscle tone through most of the ROM, but affected part(s) easily moved                        Objective measurements completed on examination: See above findings.       [facility] PT Treatment/Exercise - 12/23/21 0001       Exercises   Exercises Knee/Hip      Knee/Hip Exercises: Seated   Other Seated Knee/Hip Exercises DF 3x10      Knee/Hip Exercises: Supine  Quad Sets Strengthening;Left;3 sets;10 reps                     PT Education - 12/23/21 1610     Education  Details education in scope of PT and benefit of OT intervention for LUE and ADL deficits    Person(s) Educated Patient;Spouse    Methods Explanation    Comprehension Verbalized understanding              PT Short Term Goals - 12/23/21 1713       PT SHORT TERM GOAL #1   Title Patient will be independent in HEP to improve functional outcomes    Time 4    Period Weeks    Status New    Target Date 01/20/22      PT SHORT TERM GOAL #2   Title Patient will achieve 15 seconds for TUG test to manifest reduced risk for falls    Baseline 22 sec w/out AD    Time 4    Period Weeks    Status New    Target Date 01/20/22      PT SHORT TERM GOAL #3   Title Manifest improved BLE strength and dynamic balance per time 11 sec 5xSTS    Baseline 15 sec    Time 4    Period Weeks    Status New    Target Date 01/20/22               PT Long Term Goals - 12/23/21 1714       PT LONG TERM GOAL #1   Title Patient will demonstrate score 47/56 Berg Balance Test to manifest low risk for falls    Time 8    Period Weeks    Status New    Target Date 02/17/22      PT LONG TERM GOAL #2   Title Patient will improve gait velocity to at least 2.9 ft/sec for improved gait efficiency and safety    Baseline 2    Time 8    Period Weeks    Status New    Target Date 02/17/22                    Plan - 12/23/21 1612     Clinical Impression Statement Patient is 61 yo male with recent hx of CVA affecting dominant left side exhibiting LLE/LUE weakness, impaired coordination, gait deviations, balance deficits, high risk for falls, and decrease in functional mobility and ADL deficits as a result of impairments. High risk for falls per TUG test, LE weakness and fall risk per time on 5xSTS test, and decreased gait speed as compared to PLOF. Pt would benefit from PT services to correct mobility deficits and reduce risk for falls to enable performance consistent with PLOF    Personal Factors and  Comorbidities Comorbidity 2    Comorbidities hx of CVA, HTN, neuropathy, chronic pain    Examination-Activity Limitations Bathing;Bend;Carry;Lift;Stand;Stairs;Squat;Locomotion Level;Transfers    Examination-Participation Restrictions Cleaning;Community Activity;Yard Work;Occupation    Stability/Clinical Decision Making Evolving/Moderate complexity    Clinical Decision Making Moderate    Rehab Potential Good    PT Frequency 2x / week    PT Duration 8 weeks    PT Treatment/Interventions ADLs/Self Care Home Management;Electrical Stimulation;DME Instruction;Gait training;Stair training;Functional mobility training;Therapeutic activities;Therapeutic exercise;Balance training;Neuromuscular re-education;Manual techniques;Orthotic Fit/Training;Patient/family education;Vestibular;Joint Manipulations;Spinal Manipulations    PT Next Visit Plan trials of AFO for left foot, continued HEP strength/coordination development    PT  Home Exercise Plan QS, dorsiflexion    Recommended Other Services OT to address LUE deficits/limitations    Consulted and Agree with Plan of Care Patient;Family member/caregiver    Family Member Consulted spouse             Patient will benefit from skilled therapeutic intervention in order to improve the following deficits and impairments:  Abnormal gait, Decreased activity tolerance, Decreased balance, Decreased mobility, Decreased knowledge of use of DME, Decreased endurance, Decreased strength, Difficulty walking, Impaired UE functional use  Visit Diagnosis: Muscle weakness (generalized)  Difficulty in walking, not elsewhere classified  Unsteadiness on feet     Problem List Patient Active Problem List   Diagnosis Date Noted   Acute CVA (cerebrovascular accident) (HCC) 10/31/2021   Essential hypertension 08/31/2017   History of cocaine use 08/31/2017   History of alcohol abuse 08/31/2017   Neuropathy 02/18/2017   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, [address] [place], [place], [address] Phone: [phone]   Fax:  [phone]  Name: Delsa BernJames C Trudell MRN: [id number] Date of Birth: 06/29/1961

## 2021-12-24 ENCOUNTER — Telehealth: Payer: Self-pay

## 2021-12-24 NOTE — Telephone Encounter (Signed)
Duplicate entry  1:18 PM, 12/24/21 M. Shary Decamp, PT, DPT Physical Therapist- [place] Office Number: [phone]

## 2021-12-24 NOTE — Telephone Encounter (Signed)
Good morning Dr. Andrey Campanile,  Thank you for the recent referral of Hector Booker to our clinic to assist him in his recovery from the recent CVA.  Hector Booker also demonstrates deficits and limitations in his use and function of his LUE which cause difficulty in performance of ADL/IADL.  If you are comfortable with this, could you provide a prescription for Occupational Therapy services so that he could receive more comprehensive rehabilitation to aid in his recovery? We are a multi-disciplinary clinic and have a staff OT and speech therapist in addition to Physical Therapy.    Thank you for your consideration! 8:30 AM, 12/24/21 M. Shary Decamp, PT, DPT Physical Therapist- [place] Office Number: [phone]

## 2021-12-26 ENCOUNTER — Ambulatory Visit: Payer: Medicaid Other

## 2021-12-26 ENCOUNTER — Other Ambulatory Visit: Payer: Self-pay

## 2021-12-26 DIAGNOSIS — R262 Difficulty in walking, not elsewhere classified: Secondary | ICD-10-CM

## 2021-12-26 DIAGNOSIS — M6281 Muscle weakness (generalized): Secondary | ICD-10-CM | POA: Diagnosis not present

## 2021-12-26 DIAGNOSIS — R2681 Unsteadiness on feet: Secondary | ICD-10-CM

## 2021-12-26 NOTE — Therapy (Signed)
[facility] [address] [place], [place], [address] Phone: [phone]   Fax:  [phone]  Physical Therapy Treatment  Patient Details  Name: Hector Booker MRN: [id number] Date of Birth: Aug 13, 1961 Referring Provider (PT): [name], MD   Encounter Date: 12/26/2021   PT End of Session - 12/26/21 1410     Visit Number 2    Number of Visits 16    Date for PT Re-Evaluation 02/17/22    Authorization Type UHC Medicaid    Authorization - Visit Number 2    Authorization - Number of Visits 27    Progress Note Due on Visit 10    PT Start Time 1404    PT Stop Time 1445    PT Time Calculation (min) 41 min    Activity Tolerance Patient tolerated treatment well    Behavior During Therapy [facility] for tasks assessed/performed             Past Medical History:  Diagnosis Date   Alcohol abuse 12/04/2016   Arthritis    Cocaine abuse (HCC) 12/02/2016   Hyperlipemia    Stroke [facility])     Past Surgical History:  Procedure Laterality Date   IR GENERIC HISTORICAL  12/02/2016   IR ANGIO VERTEBRAL SEL VERTEBRAL BILAT MOD SED 12/02/2016 Julieanne Cotton, MD [facility]INTER[facility]   IR GENERIC HISTORICAL  12/02/2016   IR ANGIO INTRA EXTRACRAN SEL COM CAROTID INNOMINATE BILAT MOD SED 12/02/2016 Julieanne Cotton, MD [facility]   LOWER EXTREMITY ANGIOGRAPHY N/A 07/29/2019   Procedure: LOWER EXTREMITY ANGIOGRAPHY;  Surgeon: Chuck Hint, MD;  Location: [facility] INVASIVE CV LAB;  Service: Cardiovascular;  Laterality: N/A;    There were no vitals filed for this visit.   Subjective Assessment - 12/26/21 1416     Subjective Pt notes his left knee has been bothering him and this was the case before the stroke as he has OA in the knee and was scheduled for TKR. Other than that been exercising at home and working the LLE.    Patient is accompained by: Family member    Pertinent History PMHx of prior CVA, right vertebral artery occlusion, PAD with left  external iliac artery occlusion and claudication, HTN, HLD, polysubstance abuse (cocaine, alcohol, tobacco), peripheral neuropathy and chronic pain    Patient Stated Goals Be able to walk normally and get LUE working better    Currently in Pain? Yes    Pain Score 5     Pain Location Knee    Pain Orientation Left    Pain Descriptors / Indicators Aching;Sore;Tightness    Pain Type Chronic pain                [facility] PT Assessment - 12/26/21 0001       Assessment   Medical Diagnosis CVA                           [facility] Adult PT Treatment/Exercise - 12/26/21 0001       Transfers   Transfers Sit to Stand    Number of Reps Other reps (comment)   5x5   Transfer Cueing stride stance, bias on the LLE. 30 sec rest between sets for power      Ambulation/Gait   Ambulation/Gait Yes    Ambulation/Gait Assistance 6: Modified independent (Device/Increase time);4: Min guard    Assistive device Straight cane    Gait Pattern Decreased step length -  right;Decreased stance time - left;Left steppage    Ambulation Surface Level;Indoor;Outdoor;Paved;Grass    Gait velocity 2 ft/sec with AFO    Stairs Yes    Stairs Assistance 6: Modified independent (Device/Increase time)    Stair Management Technique One rail Right;Alternating pattern    Pre-Gait Activities using left AFO carbon fiber PLS    Gait Comments gait trianing trials using AFO/PLS for left foot/ankle due to presence of foot drop. Able to negotiate stairs with reciprocal pattern whilst wearing AFO. Continued with gait activities ambulating outside on grass/uneven surfaces requiring SBA-CGA due to unsteadiness present.      Neuro Re-ed    Neuro Re-ed Details  offset standing with RLE on 6" step to WB LLE 5x15 sec emphasis on tall back, LLE extension                       PT Short Term Goals - 12/23/21 1713       PT SHORT TERM GOAL #1   Title Patient will be independent in HEP to improve functional  outcomes    Time 4    Period Weeks    Status New    Target Date 01/20/22      PT SHORT TERM GOAL #2   Title Patient will achieve 12 seconds for TUG test to manifest reduced risk for falls    Baseline 22 sec w/out AD    Time 4    Period Weeks    Status New    Target Date 01/20/22      PT SHORT TERM GOAL #3   Title Manifest improved BLE strength and dynamic balance per time 11 sec 5xSTS    Baseline 15 sec    Time 4    Period Weeks    Status New    Target Date 01/20/22               PT Long Term Goals - 12/23/21 1714       PT LONG TERM GOAL #1   Title Patient will demonstrate score 47/56 Berg Balance Test to manifest low risk for falls    Time 8    Period Weeks    Status New    Target Date 02/17/22      PT LONG TERM GOAL #2   Title Patient will improve gait velocity to at least 2.9 ft/sec for improved gait efficiency and safety    Baseline 2    Time 8    Period Weeks    Status New    Target Date 02/17/22                   Plan - 12/26/21 1454     Clinical Impression Statement Demo good left scapular recruitment for retraction/depression. Emphasis on forced use techniques LLE for WBing and transfers with good tolerance despite left knee joint pain and crepitus. Gait trials performed without AFO and required step-to pattern with ambulation and stair ambulation. Gait trials repeated with left AFO and this greatly minimized steppage gait and enabled a more even step length with trials over various surfaces and ascending stairs with reciprocal pattern whilst wearing AFO. Gait speed remained the same, but pattern markedly improved. Continued sessions to progress LLE motor control, strength, balance, and normalize gait pattern. AFO intervention may be required to improve safety with trasfers and gait    Personal Factors and Comorbidities Comorbidity 2    Comorbidities hx of CVA, HTN, neuropathy, chronic pain  Examination-Activity Limitations  Bathing;Bend;Carry;Lift;Stand;Stairs;Squat;Locomotion Level;Transfers    Examination-Participation Restrictions Cleaning;Community Activity;Yard Work;Occupation    Stability/Clinical Decision Making Evolving/Moderate complexity    Rehab Potential Good    PT Frequency 2x / week    PT Duration 8 weeks    PT Treatment/Interventions ADLs/Self Care Home Management;Electrical Stimulation;DME Instruction;Gait training;Stair training;Functional mobility training;Therapeutic activities;Therapeutic exercise;Balance training;Neuromuscular re-education;Manual techniques;Orthotic Fit/Training;Patient/family education;Vestibular;Joint Manipulations;Spinal Manipulations    PT Next Visit Plan trials of AFO for left foot, continued HEP strength/coordination development    PT Home Exercise Plan QS, dorsiflexion, offset standing with RLE on step, sit to stand stride stance 5x5    Consulted and Agree with Plan of Care Patient;Family member/caregiver    Family Member Consulted spouse             Patient will benefit from skilled therapeutic intervention in order to improve the following deficits and impairments:  Abnormal gait, Decreased activity tolerance, Decreased balance, Decreased mobility, Decreased knowledge of use of DME, Decreased endurance, Decreased strength, Difficulty walking, Impaired UE functional use  Visit Diagnosis: Muscle weakness (generalized)  Difficulty in walking, not elsewhere classified  Unsteadiness on feet     Problem List Patient Active Problem List   Diagnosis Date Noted   Acute CVA (cerebrovascular accident) (HCC) 10/31/2021   Essential hypertension 08/31/2017   History of cocaine use 08/31/2017   History of alcohol abuse 08/31/2017   Neuropathy 02/18/2017   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) 12/25/2016   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, [address] [place], [place], [address] Phone: [phone]   Fax:  [phone]  Name: JASYAH THEURER MRN: [id number] Date of Birth: 1961/10/11

## 2021-12-30 ENCOUNTER — Other Ambulatory Visit: Payer: Self-pay

## 2021-12-30 ENCOUNTER — Ambulatory Visit: Payer: Medicaid Other

## 2021-12-30 DIAGNOSIS — M6281 Muscle weakness (generalized): Secondary | ICD-10-CM | POA: Diagnosis not present

## 2021-12-30 DIAGNOSIS — R2681 Unsteadiness on feet: Secondary | ICD-10-CM

## 2021-12-30 DIAGNOSIS — R262 Difficulty in walking, not elsewhere classified: Secondary | ICD-10-CM

## 2021-12-30 NOTE — Therapy (Signed)
[facility] [address], [place], [place], [address] Phone: [phone]   Fax:  [phone]  Physical Therapy Treatment  Patient Details  Name: Hector Booker MRN: [id number] Date of Birth: 1961-01-11 Referring Provider (PT): Christain Sacramento, MD   Encounter Date: 12/30/2021   PT End of Session - 12/30/21 1421     Visit Number 3    Number of Visits 16    Date for PT Re-Evaluation 02/17/22    Authorization Type [employer] Medicaid    Authorization - Visit Number 3    Authorization - Number of Visits 27    Progress Note Due on Visit 10    PT Start Time [id number]    PT Stop Time 1500    PT Time Calculation (min) 45 min    Activity Tolerance Patient tolerated treatment well    Behavior During Therapy [facility] for tasks assessed/performed             Past Medical History:  Diagnosis Date   Alcohol abuse 12/04/2016   Arthritis    Cocaine abuse ([place]) 12/02/2016   Hyperlipemia    Stroke [facility])     Past Surgical History:  Procedure Laterality Date   IR GENERIC HISTORICAL  12/02/2016   IR ANGIO VERTEBRAL SEL VERTEBRAL BILAT MOD SED 12/02/2016 Luanne Bras, MD [facility]INTER[facility] RAD   IR GENERIC HISTORICAL  12/02/2016   IR ANGIO INTRA EXTRACRAN SEL COM CAROTID INNOMINATE BILAT MOD SED 12/02/2016 Luanne Bras, MD [facility]   LOWER EXTREMITY ANGIOGRAPHY N/A 07/29/2019   Procedure: LOWER EXTREMITY ANGIOGRAPHY;  Surgeon: Angelia Mould, MD;  Location: [facility];  Service: Cardiovascular;  Laterality: N/A;    There were no vitals filed for this visit.   Subjective Assessment - 12/30/21 1416     Subjective Feeling some soreness/tension in back of left knee and front of left shoulder, "pulling" discomfort like it needs to be stretched. 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, HTN, HLD, polysubstance abuse (cocaine, alcohol, tobacco), peripheral neuropathy and chronic pain    Patient Stated Goals Be able to walk normally and get LUE working better                [facility] PT Assessment - 12/30/21 0001       Assessment   Medical Diagnosis CVA                           [facility] Adult PT Treatment/Exercise - 12/30/21 0001       Ambulation/Gait   Ambulation/Gait Yes    Ambulation/Gait Assistance 5: Supervision    Assistive device Straight cane    Gait Pattern Decreased step length - right;Decreased stance time - left;Left steppage    Ambulation Surface Level;Unlevel;Indoor;Outdoor;Grass;Paved    Gait Comments gait trials with ground-reaction AFO on left foot due to foot drop and hemiparesis LLE to improve loading response and step length with ability to negotiate various surfaces ambulating with Supervision and use of cane.      Knee/Hip Exercises: Stretches   Gastroc Stretch Left;5 reps;20 seconds    Gastroc Stretch Limitations slantboard with therapist stabilizing ankle      Knee/Hip Exercises: Standing   Knee Flexion Strengthening;Left;2  sets;10 reps    Knee Flexion Limitations 2 lbs    Hip Flexion Stengthening;Left;3 sets;10 reps    Hip Flexion Limitations 2 lbs and 6" step for visual guide    Other Standing Knee Exercises sidestepping x 2 min 2 lbs ankle weight LLE for hip abd and loading response      Knee/Hip Exercises: Seated   Long Arc Quad Strengthening;Left;3 sets;10 reps    Long Arc Quad Weight 2 lbs.                       PT Short Term Goals - 12/23/21 1713       PT SHORT TERM GOAL #1   Title Patient will be independent in HEP to improve functional outcomes    Time 4    Period Weeks    Status New    Target Date 01/20/22      PT SHORT TERM GOAL #2   Title Patient will achieve 12 seconds for TUG test to manifest reduced risk for falls    Baseline 22  sec w/out AD    Time 4    Period Weeks    Status New    Target Date 01/20/22      PT SHORT TERM GOAL #3   Title Manifest improved BLE strength and dynamic balance per time 11 sec 5xSTS    Baseline 15 sec    Time 4    Period Weeks    Status New    Target Date 01/20/22               PT Long Term Goals - 12/23/21 1714       PT LONG TERM GOAL #1   Title Patient will demonstrate score 47/56 Berg Balance Test to manifest low risk for falls    Time 8    Period Weeks    Status New    Target Date 02/17/22      PT LONG TERM GOAL #2   Title Patient will improve gait velocity to at least 2.9 ft/sec for improved gait efficiency and safety    Baseline 2    Time 8    Period Weeks    Status New    Target Date 02/17/22                   Plan - 12/30/21 1510     Clinical Impression Statement Demonstrates much improved gait pattern with use of ground-reaction AFO for LLE nearly achieving heel strike initial contact and improved knee extension moment vs his steppage gait LLE advancement without orthotic. With device able to ambulate on grass/uneven surfaces with supervision vs previous CGA. Progressing with activities to enhance LLE function but demonstrates quick onset of fatigue but progressing with selective control of left knee movements with the addition of resistance. Continued sessions indicated to improve LLE function and facilitate most appropriate referral for left ankle orthotic intervention and normalize gait pattern to reduce risk for falls and improve efficiency and safety of ambulation    Personal Factors and Comorbidities Comorbidity 2    Comorbidities hx of CVA, HTN, neuropathy, chronic pain    Examination-Activity Limitations Bathing;Bend;Carry;Lift;Stand;Stairs;Squat;Locomotion Level;Transfers    Examination-Participation Restrictions Cleaning;Community Activity;Yard Work;Occupation    Stability/Clinical Decision Making Evolving/Moderate complexity    Rehab  Potential Good    PT Frequency 2x / week    PT Duration 8 weeks    PT Treatment/Interventions ADLs/Self Care Home Management;Electrical Stimulation;DME Instruction;Gait training;Stair training;Functional mobility training;Therapeutic  activities;Therapeutic exercise;Balance training;Neuromuscular re-education;Manual techniques;Orthotic Fit/Training;Patient/family education;Vestibular;Joint Manipulations;Spinal Manipulations    PT Next Visit Plan trials of AFO for left foot, continued HEP strength/coordination development    PT Home Exercise Plan QS, dorsiflexion, offset standing with RLE on step, sit to stand stride stance 5x5    Consulted and Agree with Plan of Care Patient;Family member/caregiver    Family Member Consulted spouse             Patient will benefit from skilled therapeutic intervention in order to improve the following deficits and impairments:  Abnormal gait, Decreased activity tolerance, Decreased balance, Decreased mobility, Decreased knowledge of use of DME, Decreased endurance, Decreased strength, Difficulty walking, Impaired UE functional use  Visit Diagnosis: Muscle weakness (generalized)  Difficulty in walking, not elsewhere classified  Unsteadiness on feet     Problem List Patient Active Problem List   Diagnosis Date Noted   Acute CVA (cerebrovascular accident) ([place]) 10/31/2021   Essential hypertension 08/31/2017   History of cocaine use 08/31/2017   History of alcohol abuse 08/31/2017   Neuropathy 02/18/2017   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 [place], [place], [address] Phone: [phone]   Fax:   [phone]  Name: HASTEN SILVERSTONE MRN: [id number] Date of Birth: Apr 13, 1961

## 2021-12-31 ENCOUNTER — Inpatient Hospital Stay: Payer: Medicaid Other | Admitting: Adult Health

## 2022-01-02 ENCOUNTER — Ambulatory Visit: Payer: Medicaid Other | Admitting: Physical Therapy

## 2022-01-06 ENCOUNTER — Ambulatory Visit: Payer: Medicaid Other | Attending: Family Medicine

## 2022-01-06 ENCOUNTER — Other Ambulatory Visit: Payer: Self-pay

## 2022-01-06 DIAGNOSIS — R262 Difficulty in walking, not elsewhere classified: Secondary | ICD-10-CM | POA: Diagnosis present

## 2022-01-06 DIAGNOSIS — R2681 Unsteadiness on feet: Secondary | ICD-10-CM | POA: Insufficient documentation

## 2022-01-06 DIAGNOSIS — M6281 Muscle weakness (generalized): Secondary | ICD-10-CM | POA: Insufficient documentation

## 2022-01-06 NOTE — Therapy (Signed)
[facility] [address] [place], [place], [address] Phone: [phone]   Fax:  [phone]  Physical Therapy Treatment  Patient Details  Name: Hector Booker MRN: [id number] Date of Birth: 03/17/1961 Referring Provider (PT): Christain Sacramento, MD   Encounter Date: 01/06/2022   PT End of Session - 01/06/22 1404     Visit Number 4    Number of Visits 16    Date for PT Re-Evaluation 02/17/22    Authorization Type [employer] Medicaid    Authorization - Visit Number 4    Authorization - Number of Visits 27    Progress Note Due on Visit 10    PT Start Time [id number]    PT Stop Time 1445    PT Time Calculation (min) 43 min    Activity Tolerance Patient tolerated treatment well    Behavior During Therapy [facility] for tasks assessed/performed             Past Medical History:  Diagnosis Date   Alcohol abuse 12/04/2016   Arthritis    Cocaine abuse ([place]) 12/02/2016   Hyperlipemia    Stroke [facility])     Past Surgical History:  Procedure Laterality Date   IR GENERIC HISTORICAL  12/02/2016   IR ANGIO VERTEBRAL SEL VERTEBRAL BILAT MOD SED 12/02/2016 Luanne Bras, MD [facility]   IR GENERIC HISTORICAL  12/02/2016   IR ANGIO INTRA EXTRACRAN SEL COM CAROTID INNOMINATE BILAT MOD SED 12/02/2016 Luanne Bras, MD [facility]   LOWER EXTREMITY ANGIOGRAPHY N/A 07/29/2019   Procedure: LOWER EXTREMITY ANGIOGRAPHY;  Surgeon: Angelia Mould, MD;  Location: [facility];  Service: Cardiovascular;  Laterality: N/A;    There were no vitals filed for this visit.   Subjective Assessment - 01/06/22 1544     Subjective Notes LLE feels very sore and tight along the calf, knee, and groin. Reports he has been exercising the LE several times a day    Patient is accompained by: Family member    Pertinent History PMHx of prior CVA, right vertebral artery occlusion, PAD with left external iliac artery occlusion and claudication, HTN, HLD, polysubstance  abuse (cocaine, alcohol, tobacco), peripheral neuropathy and chronic pain    Patient Stated Goals Be able to walk normally and get LUE working better    Currently in Pain? Yes    Pain Score 7     Pain Location Knee    Pain Orientation Left    Pain Descriptors / Indicators Aching;Sore;Tightness    Pain Type Chronic pain;Acute pain                [facility] PT Assessment - 01/06/22 0001       Assessment   Medical Diagnosis CVA                           [facility] Adult PT Treatment/Exercise - 01/06/22 0001       Ambulation/Gait   Ambulation/Gait Yes    Ambulation/Gait Assistance 5: Supervision;4: Min guard;4: Min assist    Ambulation/Gait Assistance Details Therapist providing tactile cues/facilitation for left trunk rotation and hip extension at terminal stance    Ambulation Distance (Feet) 300 Feet    Assistive device Straight cane   Left PLS AFO   Gait Pattern Decreased step length - right;Decreased stance time - left;Left steppage    Ambulation Surface Level;Grass;Paved    Gait Comments gait training performed outside with cane and  left AFO for foot clearance with emphasis negotiating various surfaces.      Neuro Re-ed    Neuro Re-ed Details  Performing ball kicking activity x 2 min for weight shifting and motor planning to improve coordination and facilitate anticipatory balance strategies. Squat/throw/bounce large swiss ball x 2 min to improve flexion over BOS for safety in picking up items form ground. Trunk twists holding swiss ball and hitting to target on wall 2x10 reps to improve trunk strength/rotation for improved gait mechanics. Offset standing with therapist stabilizing at left knee for stance/loading 1x10 reps advancing RLE, decreased tolerance due to left knee pain                     PT Education - 01/06/22 1545     Education Details discussion regarding knee stabilization bracing options    Person(s) Educated Patient    Methods  Explanation    Comprehension Verbalized understanding              PT Short Term Goals - 12/23/21 1713       PT SHORT TERM GOAL #1   Title Patient will be independent in HEP to improve functional outcomes    Time 4    Period Weeks    Status New    Target Date 01/20/22      PT SHORT TERM GOAL #2   Title Patient will achieve 12 seconds for TUG test to manifest reduced risk for falls    Baseline 22 sec w/out AD    Time 4    Period Weeks    Status New    Target Date 01/20/22      PT SHORT TERM GOAL #3   Title Manifest improved BLE strength and dynamic balance per time 11 sec 5xSTS    Baseline 15 sec    Time 4    Period Weeks    Status New    Target Date 01/20/22               PT Long Term Goals - 12/23/21 1714       PT LONG TERM GOAL #1   Title Patient will demonstrate score 47/56 Berg Balance Test to manifest low risk for falls    Time 8    Period Weeks    Status New    Target Date 02/17/22      PT LONG TERM GOAL #2   Title Patient will improve gait velocity to at least 2.9 ft/sec for improved gait efficiency and safety    Baseline 2    Time 8    Period Weeks    Status New    Target Date 02/17/22                   Plan - 01/06/22 1552     Clinical Impression Statement Improved gait mechanics with use of left AFO aiding in foot clearance during swing phase limiting the degree of steppage gait required. Assessment of left knee reveals quite a degree of valgus instability with considerable shifting/clunking noted to left knee medial compartment with degree of rotatory instability as wall as frontal plane. Pt notes chronic history of left knee pain and debility prior to onset of stroke.  He would likely benefit from a substantive hinged knee brace intervention to stabilize the extremity in addition to a left AFO intervention to correct left foot drop since onset of stroke.  Gait trials improved via tactile cues to facilitate left trunk extension  and  hip extension at terminal stance which improved right step length. Continued sessions indicated to progress gait, balance, LE strength, motor control to facilitate more independent and efficient gait/transfers with low risk for falls    Personal Factors and Comorbidities Comorbidity 2    Comorbidities hx of CVA, HTN, neuropathy, chronic pain    Examination-Activity Limitations Bathing;Bend;Carry;Lift;Stand;Stairs;Squat;Locomotion Level;Transfers    Examination-Participation Restrictions Cleaning;Community Activity;Yard Work;Occupation    Stability/Clinical Decision Making Evolving/Moderate complexity    Rehab Potential Good    PT Frequency 2x / week    PT Duration 8 weeks    PT Treatment/Interventions ADLs/Self Care Home Management;Electrical Stimulation;DME Instruction;Gait training;Stair training;Functional mobility training;Therapeutic activities;Therapeutic exercise;Balance training;Neuromuscular re-education;Manual techniques;Orthotic Fit/Training;Patient/family education;Vestibular;Joint Manipulations;Spinal Manipulations    PT Next Visit Plan trials of AFO for left foot, continued HEP strength/coordination development    PT Home Exercise Plan QS, dorsiflexion, offset standing with RLE on step, sit to stand stride stance 5x5    Consulted and Agree with Plan of Care Patient;Family member/caregiver    Family Member Consulted spouse             Patient will benefit from skilled therapeutic intervention in order to improve the following deficits and impairments:  Abnormal gait, Decreased activity tolerance, Decreased balance, Decreased mobility, Decreased knowledge of use of DME, Decreased endurance, Decreased strength, Difficulty walking, Impaired UE functional use  Visit Diagnosis: Muscle weakness (generalized)  Difficulty in walking, not elsewhere classified  Unsteadiness on feet     Problem List Patient Active Problem List   Diagnosis Date Noted   Acute CVA (cerebrovascular  accident) ([place]) 10/31/2021   Essential hypertension 08/31/2017   History of cocaine use 08/31/2017   History of alcohol abuse 08/31/2017   Neuropathy 02/18/2017   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 [place], [place], [address] Phone: [phone]   Fax:  [phone]  Name: ZECHARIAH STINSON MRN: [id number] Date of Birth: 1961-05-29

## 2022-01-09 ENCOUNTER — Ambulatory Visit: Payer: Medicaid Other

## 2022-01-13 ENCOUNTER — Ambulatory Visit: Payer: Medicaid Other | Admitting: Physical Therapy

## 2022-01-16 ENCOUNTER — Encounter: Payer: Self-pay | Admitting: Physical Therapy

## 2022-01-16 ENCOUNTER — Other Ambulatory Visit: Payer: Self-pay

## 2022-01-16 ENCOUNTER — Ambulatory Visit: Payer: Medicaid Other | Admitting: Physical Therapy

## 2022-01-16 DIAGNOSIS — M6281 Muscle weakness (generalized): Secondary | ICD-10-CM | POA: Diagnosis not present

## 2022-01-16 NOTE — Therapy (Signed)
[place] ?[facility] ?[address], [address] ?[place], [place], [address] ?Phone: [phone]   Fax:  [phone] ? ?Physical Therapy Treatment ? ?Patient Details  ?Name: Hector Booker ?MRN: [id number] ?Date of Birth: 09/10/1961 ?Referring Provider (PT): [name], MD ? ? ?Encounter Date: 01/16/2022 ? ? PT End of Session - 01/16/22 1452   ? ? Visit Number 5   ? Number of Visits 16   ? Date for PT Re-Evaluation 02/17/22   ? Authorization Type U[employer] Medicaid   ? Authorization - Visit Number 5   ? Authorization - Number of Visits 27   ? Progress Note Due on Visit 10   ? PT Start Time 1403   ? PT Stop Time 1445   ? PT Time Calculation (min) 42 min   ? Equipment Utilized During Treatment Gait belt   ? Activity Tolerance Patient tolerated treatment well   ? Behavior During Therapy [facility] for tasks assessed/performed   ? ?  ?  ? ?  ? ? ?Past Medical History:  ?Diagnosis Date  ? Alcohol abuse 12/04/2016  ? Arthritis   ? Cocaine abuse (HCC) 12/02/2016  ? Hyperlipemia   ? Stroke [facility])   ? ? ?Past Surgical History:  ?Procedure Laterality Date  ? IR GENERIC HISTORICAL  12/02/2016  ? IR ANGIO VERTEBRAL SEL VERTEBRAL BILAT MOD SED 12/02/2016 Julieanne Cotton, MD [facility]-INTER[facility] RAD  ? IR GENERIC HISTORICAL  12/02/2016  ? IR ANGIO INTRA EXTRACRAN SEL COM CAROTID INNOMINATE BILAT MOD SED 12/02/2016 Julieanne Cotton, MD [facility]  ? LOWER EXTREMITY ANGIOGRAPHY N/A 07/29/2019  ? Procedure: LOWER EXTREMITY ANGIOGRAPHY;  Surgeon: Chuck Hint, MD;  Location: [facility] INVASIVE CV LAB;  Service: Cardiovascular;  Laterality: N/A;  ? ? ?There were no vitals filed for this visit. ? ? Subjective Assessment - 01/16/22 1404   ? ? Subjective Has a face to face appointment with PCP about getting an AFO today. Had a cortisone injection in the L knee and is feeling better now.   ? Pertinent History PMHx of prior CVA, right vertebral artery occlusion, PAD with left external iliac artery occlusion and claudication, HTN, HLD,  polysubstance abuse (cocaine, alcohol, tobacco), peripheral neuropathy and chronic pain   ? Patient Stated Goals Be able to walk normally and get LUE working better   ? Currently in Pain? Yes   ? Pain Score 6    ? Pain Location Knee   ? Pain Orientation Left   ? Pain Descriptors / Indicators Aching   ? Pain Type Chronic pain   ? ?  ?  ? ?  ? ? ? ? ? ? ? ? ? ? ? ? ? ? ? ? ? ? ? ? [facility] Adult PT Treatment/Exercise - 01/16/22 0001   ? ?  ? Ambulation/Gait  ? Ambulation/Gait Assistance 5: Supervision   ? Ambulation Distance (Feet) 80 Feet   ? Assistive device Straight cane   ? Gait Pattern Decreased step length - right;Decreased stance time - left;Left steppage   ? Ambulation Surface Level;Indoor   ? Gait Comments with L Thu[place]ne lateral strut AFO- resulted in improved heel strike and reduced steppage gait, patient actually with good stability without SPC   ?  ? Neuro Re-ed   ? Neuro Re-ed Details  R/L wt shift with R UE on handrail 10x, L wt shift + R toe tap on 6" step 2x5,   intermittent sitting rest break d/t L knee pain  ?  ?  Knee/Hip Exercises: Aerobic  ? Nustep L3x 6 (UEs/LEs)   ?  ? Knee/Hip Exercises: Standing  ? Hip Flexion Stengthening;Left;1 set;10 reps   ? Hip Flexion Limitations L toe tap on 12" step   ? Hip Extension Stengthening;1 set;10 reps;Knee straight   ? Extension Limitations maual/verbal cues to maintain chest up   ?  ? Knee/Hip Exercises: Supine  ? Bridges Strengthening;1 set;10 reps   ? ?  ?  ? ?  ? ? ? ? ? ? ? ? ? ? PT Education - 01/16/22 1451   ? ? Education Details discussion on how to obtain L AFO, OT referral, and L knee brace to assist patient in rehab recovery.   ? Person(s) Educated Patient   ? Methods Explanation;Demonstration;Tactile cues;Verbal cues;Handout   ? Comprehension Verbalized understanding;Returned demonstration   ? ?  ?  ? ?  ? ? ? PT Short Term Goals - 01/16/22 1453   ? ?  ? PT SHORT TERM GOAL #1  ? Title Patient will be independent in HEP to improve functional outcomes   ?  Time 4   ? Period Weeks   ? Status On-going   ? Target Date 01/20/22   ?  ? PT SHORT TERM GOAL #2  ? Title Patient will achieve 12 seconds for TUG test to manifest reduced risk for falls   ? Baseline 22 sec w/out AD   ? Time 4   ? Period Weeks   ? Status On-going   ? Target Date 01/20/22   ?  ? PT SHORT TERM GOAL #3  ? Title Manifest improved BLE strength and dynamic balance per time 11 sec 5xSTS   ? Baseline 15 sec   ? Time 4   ? Period Weeks   ? Status On-going   ? Target Date 01/20/22   ? ?  ?  ? ?  ? ? ? ? PT Long Term Goals - 01/16/22 1453   ? ?  ? PT LONG TERM GOAL #1  ? Title Patient will demonstrate score 47/56 Berg Balance Test to manifest low risk for falls   ? Time 8   ? Period Weeks   ? Status On-going   ? Target Date 02/17/22   ?  ? PT LONG TERM GOAL #2  ? Title Patient will improve gait velocity to at least 2.9 ft/sec for improved gait efficiency and safety   ? Baseline 2   ? Time 8   ? Period Weeks   ? Status On-going   ? Target Date 02/17/22   ? ?  ?  ? ?  ? ? ? ? ? ? ? ? Plan - 01/16/22 1452   ? ? Clinical Impression Statement Patient reports that he is scheduled to see PCP about obtaining an AFO. Notes improved L knee pain after receiving a L knee cortisone injection. Provided information about obtaining L AFO, OT referral, and L knee brace to assist patient in rehab recovery. Patient with good response with gait training with L Thuasne lateral strut AFO as it resulted in improved heel strike and reduced steppage gait. Patient required intermittent sitting rest breaks after short periods of standing balance activities d/t L knee pain, but showed good effort in improving L weight shift. Hip instability evident with core strengthening activities. Patient without complaints at end of session.   ? Personal Factors and Comorbidities Comorbidity 2   ? Comorbidities hx of CVA, HTN, neuropathy, chronic pain   ? Examination-Activity Limitations  Bathing;Bend;Carry;Lift;Stand;Stairs;Squat;Locomotion  Level;Transfers   ? Examination-Participation Restrictions Cleaning;Community Activity;Yard Work;Occupation   ? Stability/Clinical Decision Making Evolving/Moderate complexity   ? Rehab Potential Good   ? PT Frequency 2x / week   ? PT Duration 8 weeks   ? PT Treatment/Interventions ADLs/Self Care Home Management;Electrical Stimulation;DME Instruction;Gait training;Stair training;Functional mobility training;Therapeutic activities;Therapeutic exercise;Balance training;Neuromuscular re-education;Manual techniques;Orthotic Fit/Training;Patient/family education;Vestibular;Joint Manipulations;Spinal Manipulations   ? PT Next Visit Plan trials of AFO for left foot, continued HEP strength/coordination development   ? PT Home Exercise Plan QS, dorsiflexion, offset standing with RLE on step, sit to stand stride stance 5x5   ? Consulted and Agree with Plan of Care Patient   ? Family Member Consulted spouse   ? ?  ?  ? ?  ? ? ?Patient will benefit from skilled therapeutic intervention in order to improve the following deficits and impairments:  Abnormal gait, Decreased activity tolerance, Decreased balance, Decreased mobility, Decreased knowledge of use of DME, Decreased endurance, Decreased strength, Difficulty walking, Impaired UE functional use ? ?Visit Diagnosis: ?Muscle weakness (generalized) ? ?Difficulty in walking, not elsewhere classified ? ?Unsteadiness on feet ? ? ? ? ?Problem List ?Patient Active Problem List  ? Diagnosis Date Noted  ? Acute CVA (cerebrovascular accident) (HCC) 10/31/2021  ? Essential hypertension 08/31/2017  ? History of cocaine use 08/31/2017  ? History of alcohol abuse 08/31/2017  ? Neuropathy 02/18/2017  ? 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 Vertebral artery occlusion, 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  ? Hyperlipidemia 12/02/2016  ? Smoker 12/02/2016  ? Left pontine stroke [facility]) s/p tPA 12/01/2016  ? ? ?Anette Guarneri, PT,  DPT ?01/16/22 2:54 PM ? ? ?[place] ?[facility] ?[address], [address] ?[place], [place], [address] ?Phone: [phone]   Fax:  [phone] ? ?Name: Hector Booker ?MRN: [id number]

## 2022-01-16 NOTE — Patient Instructions (Addendum)
[facility] Your physical therapist has recommended orthotics as part of your treatment plan. Financial coverage for orthotics varies significantly among insurance carriers and individual plans. In order to minimize the costs you will be responsible for, it is important that you do the following: Schedule an appointment (face-to-face visit required) with the physician who referred you for physical therapy OR your primary care provider. Provide the attached letter to the physician along with the prescription for orthotics.  Please note that the physician's documentation for that visit MUST include the benefit of bracing. If a face to face visit is not documented, your insurance MAY NOT cover the orthotics and you would be responsible for the cost.  Once you have completed this appointment AND have the signed prescription for orthotics, call the orthotist to schedule a time:  For the orthotist to attend one of your physical therapy sessions for the orthotic evaluation.  For you to visit their office for the orthotic evaluation.  When you call to schedule, please be prepared to update the patient's information including the insurance you plan to file.    PT Recommendation made by: Jane Jordan Pardini, PT, DPT    [facility] [address] [place], [place] [address] P: [phone] F: [phone]   [facility] [address], [address] [place], [place] [address] P: [phone] F: [phone]    Dear Physician,   Due to insurance regulations patients that will benefit from orthotic bracing must have a face-to-face visit with a physician within the last 6 months wherein the benefit of bracing has been discussed and documented in the patient file.  Often you most likely refer patients to physical therapy for treatment and the physical therapist is the one to determine that Orthotics in conjunction with therapy will benefit the patient.   In these cases we will be sending the patient back to you to get  the Rx and notes necessary to provide bracing under insurance guidelines. In order to eliminate this extra visit in the future, please anytime you refer a patient for physical therapy indicate in your clinical findings "Referring patient for physical therapy and orthotic bracing if applicable."  This will allow the physical therapist to determine if bracing will benefit the patient and prevent the patient from having to be sent back to you for a bracing referral.   In the short term we need the following from you. A Rx stating "Eval and Fit with Orthotic Bracing to improve gait" Indicate in your clinical notes "Discussed orthotic bracing with patient and family, patient will functionally benefit"  Thank you for all that you do for your patients and for assisting us in this matter.  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), right foot M21.42 Flat foot [pes planus] (acquired), left foot R26.9 Unspecified abnormalities of gait and mobility M25.60 Stiffness of Joint M25.672 Stiffness of ankle joint, left foot M25.671 Stiffness of ankle joint, right foot  M25.673 Stiffness of ankle joint, unspecified laterality Other _____________________________ Orthotics/DME Bilateral Custom Ankle Foot Orthosis Left Custom Ankle Foot Orthosis Right Custom Ankle Foot Orthosis Left Ankle Foot Orthosis Right Ankle Foot Orthosis Insert Orthotic  Shoes to Accommodate Orthotics Other: __________________________    MD Signature ______________________________________     Date ______________________  [facility] [address] [place], [place] [address] P: [phone] F: [phone]  [facility] [address] [place], [place] [address] P: [phone] F: [phone]  

## 2022-01-20 ENCOUNTER — Ambulatory Visit: Payer: Medicaid Other | Admitting: Rehabilitative and Restorative Service Providers"

## 2022-01-23 ENCOUNTER — Ambulatory Visit: Payer: Medicaid Other | Admitting: Rehabilitative and Restorative Service Providers"

## 2022-01-27 ENCOUNTER — Other Ambulatory Visit: Payer: Self-pay

## 2022-01-27 ENCOUNTER — Ambulatory Visit: Payer: Medicaid Other | Admitting: Rehabilitative and Restorative Service Providers"

## 2022-01-27 ENCOUNTER — Encounter: Payer: Self-pay | Admitting: Rehabilitative and Restorative Service Providers"

## 2022-01-27 DIAGNOSIS — M6281 Muscle weakness (generalized): Secondary | ICD-10-CM

## 2022-01-27 DIAGNOSIS — R262 Difficulty in walking, not elsewhere classified: Secondary | ICD-10-CM

## 2022-01-27 DIAGNOSIS — R2681 Unsteadiness on feet: Secondary | ICD-10-CM

## 2022-01-27 NOTE — Therapy (Addendum)
[facility] [address], [place], [place], [address] Phone: [phone]   Fax:  [phone]  Physical Therapy Treatment  Patient Details  Name: Hector Booker MRN: [id number] Date of Birth: [birth year] Referring Provider (PT): Christain [place], MD   Encounter Date: 01/27/2022   PT End of Session - 01/27/22 1434     Visit Number 6    Number of Visits 16    Date for PT Re-Evaluation 02/17/22    Authorization Type UHC Medicaid    Authorization - Visit Number 6    Authorization - Number of Visits 27    Progress Note Due on Visit 10    PT Start Time 5400    PT Stop Time 1444    PT Time Calculation (min) 39 min    Equipment Utilized During Treatment Gait belt    Activity Tolerance Patient tolerated treatment well    Behavior During Therapy [facility] for tasks assessed/performed             Past Medical History:  Diagnosis Date   Alcohol abuse 12/04/2016   Arthritis    Cocaine abuse ([place]) 12/02/2016   Hyperlipemia    Stroke [facility])     Past Surgical History:  Procedure Laterality Date   IR GENERIC HISTORICAL  12/02/2016   IR ANGIO VERTEBRAL SEL VERTEBRAL BILAT MOD SED 12/02/2016 Luanne Bras, MD [facility]INTER[facility] RAD   IR GENERIC HISTORICAL  12/02/2016   IR ANGIO INTRA EXTRACRAN SEL COM CAROTID INNOMINATE BILAT MOD SED 12/02/2016 Luanne Bras, MD [facility]INTER[facility]   LOWER EXTREMITY ANGIOGRAPHY N/A 07/29/2019   Procedure: LOWER EXTREMITY ANGIOGRAPHY;  Surgeon: Angelia Mould, MD;  Location: [facility] CV LAB;  Service: Cardiovascular;  Laterality: N/A;    There were no vitals filed for this visit.   Subjective Assessment - 01/27/22 1410     Subjective The patient saw his doctor and brought his paperwork for the AFO.  His doctor is planning to fax it to [place]-- checked with front office and no PW has arrived yet.    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 Yes    Pain Score 8     Pain Location Knee    Pain Orientation Left    Pain Descriptors / Indicators Aching;Discomfort    Pain Type Chronic pain    Pain Onset More than a month ago    Pain Frequency Intermittent    Aggravating Factors  walking    Pain Relieving Factors rest    Multiple Pain Sites Yes    Pain Score 8    Pain Location Shoulder    Pain Orientation Left    Pain Type Chronic pain    Pain Onset More than a month ago    Pain Frequency Intermittent    Aggravating Factors  movement    Pain Relieving Factors voltaren                               [facility] Adult PT Treatment/Exercise - 01/27/22 1600       Ambulation/Gait   Ambulation/Gait Yes    Ambulation/Gait Assistance 5: Supervision    Ambulation Distance (Feet)  140 Feet   50 feet outdoors on sidewalk, 100 ft x 2 in clinic   Assistive device Straight cane;None    Gait Comments patient forgot his cane today-- PT used SPC from clinic with longer ditances and L AFO thuasne PLS AFO.  Cues for L UE to extend fingers and straighten L elbow      Neuro Re-ed    Neuro Re-ed Details  Sit<>stand x 5 reps with dec'ing UE support, L leg stance activities working on R LE kick to 18" cone and then L LE motor control working on extension of knee with kicks to cone as target.  Compliant surface lateral steps ups L LE onto 1" foam surface.      Exercises   Exercises Knee/Hip      Knee/Hip Exercises: Stretches   Active Hamstring Stretch Left;2 reps;30 seconds    Hip Flexor Stretch Left;2 reps;30 seconds    Gastroc Stretch Left;2 reps;30 seconds                       PT Short Term Goals - 01/27/22 1434       PT SHORT TERM GOAL #1   Title Patient will be independent in HEP to improve  functional outcomes    Baseline Patient has HEP-- PT to review    Time 4    Period Weeks    Status Partially Met    Target Date 01/20/22      PT SHORT TERM GOAL #2   Title Patient will achieve 12 seconds for TUG test to manifest reduced risk for falls    Baseline 22 sec w/out AD    Time 4    Period Weeks    Status On-going    Target Date 01/20/22      PT SHORT TERM GOAL #3   Title Manifest improved BLE strength and dynamic balance per time 11 sec 5xSTS    Baseline 15 sec    Time 4    Period Weeks    Status On-going    Target Date 01/20/22               PT Long Term Goals - 01/16/22 1453       PT LONG TERM GOAL #1   Title Patient will demonstrate score 47/56 Berg Balance Test to manifest low risk for falls    Time 8    Period Weeks    Status On-going    Target Date 02/17/22      PT LONG TERM GOAL #2   Title Patient will improve gait velocity to at least 2.9 ft/sec for improved gait efficiency and safety    Baseline 2    Time 8    Period Weeks    Status On-going    Target Date 02/17/22                   Plan - 01/27/22 1448     Clinical Impression Statement Plan to check goals next visit-- patient was too fatigued nearing end of today's session to assess goals.  Patient has significant improvement in L foot clearance with use of L AFO.  PT to watch for PW to get appointment scheduled with orthotist.  Plan to update STGs/LTGs once reassessed next session.  Patient making progress to goals.  Shows improvement within session for gait mechanics, and L knee extension.    Personal Factors and Comorbidities --    Comorbidities hx of CVA, HTN, neuropathy, chronic  pain    Examination-Activity Limitations --    Examination-Participation Restrictions --    Stability/Clinical Decision Making --    Rehab Potential Good    PT Frequency 2x / week    PT Duration 8 weeks    PT Treatment/Interventions ADLs/Self Care Home Management;Electrical Stimulation;DME  Instruction;Gait training;Stair training;Functional mobility training;Therapeutic activities;Therapeutic exercise;Balance training;Neuromuscular re-education;Manual techniques;Orthotic Fit/Training;Patient/family education;Vestibular;Joint Manipulations;Spinal Manipulations    PT Next Visit Plan trials of AFO for left foot, continued HEP strength/coordination development    PT Home Exercise Plan QS, dorsiflexion, offset standing with RLE on step, sit to stand stride stance 5x5    Consulted and Agree with Plan of Care Patient    Family Member Consulted spouse             Patient will benefit from skilled therapeutic intervention in order to improve the following deficits and impairments:  Abnormal gait, Decreased activity tolerance, Decreased balance, Decreased mobility, Decreased knowledge of use of DME, Decreased endurance, Decreased strength, Difficulty walking, Impaired UE functional use  Visit Diagnosis: Muscle weakness (generalized)  Difficulty in walking, not elsewhere classified  Unsteadiness on feet     Problem List Patient Active Problem List   Diagnosis Date Noted   Acute CVA (cerebrovascular accident) ([facility]) 10/31/2021   Essential hypertension 08/31/2017   History of cocaine use 08/31/2017   History of alcohol abuse 08/31/2017   Neuropathy 02/18/2017   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, [place], [place], [address] Phone: [phone]   Fax:  [phone]  Name: Hector Booker MRN: [id number] Date of Birth: 03/06/1961   PHYSICAL THERAPY DISCHARGE SUMMARY  Visits from Start of Care: 6  Current functional level related to  goals / functional outcomes: Unable to assess; patient did not return   Remaining deficits: Unable to assess   Education / Equipment: HEP  Plan: Patient agrees to discharge.  Patient goals were not met. Patient is being discharged due to not returning to PT.     Janene Harvey, PT, DPT 04/21/22 2:27 PM  [facility] [address] [place], [place], [place] [address] Phone # [phone] Fax # [phone]

## 2022-01-30 ENCOUNTER — Ambulatory Visit: Payer: Medicaid Other | Admitting: Rehabilitative and Restorative Service Providers"

## 2022-02-03 ENCOUNTER — Ambulatory Visit: Payer: Medicaid Other | Admitting: Rehabilitative and Restorative Service Providers"

## 2022-02-06 ENCOUNTER — Ambulatory Visit: Payer: Medicaid Other | Admitting: Physical Therapy

## 2022-02-10 ENCOUNTER — Ambulatory Visit: Payer: Self-pay | Admitting: Physical Therapy

## 2022-02-10 ENCOUNTER — Ambulatory Visit: Payer: Medicaid Other | Admitting: Physical Therapy

## 2022-02-10 DIAGNOSIS — R2681 Unsteadiness on feet: Secondary | ICD-10-CM

## 2022-02-10 DIAGNOSIS — M6281 Muscle weakness (generalized): Secondary | ICD-10-CM

## 2022-02-10 DIAGNOSIS — R262 Difficulty in walking, not elsewhere classified: Secondary | ICD-10-CM

## 2022-02-10 NOTE — Therapy (Signed)
Patient arrived to session with report of knee pain and notes that he is unable to tolerate PT session today. Received verbal consent to send patient's face sheet to [facility] in order to receive AFO. Patient reports that he plans to resume PT once he receives his AFO. Left without being seen.  ?

## 2022-02-13 ENCOUNTER — Ambulatory Visit: Payer: Medicaid Other | Admitting: Physical Therapy

## 2022-02-18 ENCOUNTER — Ambulatory Visit: Payer: Medicaid Other | Admitting: Occupational Therapy

## 2022-02-24 ENCOUNTER — Ambulatory Visit: Payer: Medicaid Other | Admitting: Occupational Therapy

## 2022-02-24 ENCOUNTER — Ambulatory Visit: Payer: Medicaid Other | Admitting: Physical Therapy

## 2022-06-05 ENCOUNTER — Ambulatory Visit (INDEPENDENT_AMBULATORY_CARE_PROVIDER_SITE_OTHER): Payer: Medicaid Other | Admitting: Family Medicine

## 2022-06-05 ENCOUNTER — Encounter: Payer: Self-pay | Admitting: Family Medicine

## 2022-06-05 VITALS — BP 146/91 | HR 87 | Ht 70.0 in | Wt 181.0 lb

## 2022-06-05 DIAGNOSIS — I639 Cerebral infarction, unspecified: Secondary | ICD-10-CM | POA: Diagnosis not present

## 2022-06-05 NOTE — Progress Notes (Signed)
[facility] [address] [place]. [place] [address] [phone]       HOSPITAL FOLLOW UP NOTE  [name] Date of Birth:  1961-04-07 Medical Record Number:  [id number]   Reason for Referral:  hospital stroke follow up    SUBJECTIVE:   CHIEF COMPLAINT:  Chief Complaint  Patient presents with   Follow-up    Pt with wife, rm 3. States overall stable. No issues or concerns.    HPI:   Hector Booker is a 61 y.o. with PMHx of prior CVA, right vertebral artery occlusion, PAD with left external iliac artery occlusion and claudication, HTN, HLD, polysubstance abuse (cocaine, alcohol, tobacco), peripheral neuropathy and chronic pain who presented on 10/31/2021 with left sided weakness, facial droop and slurred speech noted afternoon of 12/29.  CT showed no acute changes but chronic right infarct. CTA head/neck showed severe narrowing on the right proximal vertebral artery V2 segment and evidence of remote infarct. Patient MRI of the brain did show an acute Pontine infarct on the right with remote right inferior division MCA infarct  Evaluated by Dr Otelia Limes. NIHSS 5, mRS 1.   He was discharged home on asa 325mg  daily and Plavix 75mg  daily for three months then continue Plavix only. He is already taking atorvastatin 40mg  daily.   He reported to PCP at follow up 11/05/2021 that when discharged from hospital, left sided symptoms had significantly improved. He stated that symptoms returned and was not able to walk. Weakness noted of left upper and lower extremity on exam. PCP advised ER eval for concerns of recurring stroke. His mom called PCP 11/07/2021 with concerns of patient having new left sided weakness and numbness, not present with stoke. ER eval advised. Patient refused. I see where he did go but left before being seen. He reported to PCP 11/2021 that he was not taking asa.   Personally reviewed hospitalization pertinent progress notes, lab work and imaging.  PT/OT  referrals were placed but two agencies were unable to see him. Last referral sent to [facility] 11/29/2021. Starts therapy Monday. He is currently able to walk short distances with no assistive devises, but requires a cane for longer distances. He endorses that his strength has gotten better since the hospitalization.  He continues to take the Lisinipril 20 mg daily. His BP was elevated today. He does smoke, but he endorses desire to quit.  Update 06/05/2022 ALL: Hector Booker returns for follow up for CVA. He reports doing very well. He has completed PT. He feels left arm strength is significantly improved. He still has ROM limitations but does not feel bothered by these. He is wearing a foot drop brace to left foot. He feels gait is steady. No falls. He is followed closely by PCP. BP is usually normal. He does continue to smoke. He continues atorvastatin and Plavix.      PERTINENT IMAGING/LABS Per hospitalization 12/29-12/30/23 Code Stroke CT No acute abnormality.  CTA head & neck right vertebral artery stenosis at V1 and V4 and ends at pica, mild Basilar artery stenosis  CT perfusion- Areas of ischemia demonstrated on CTP are likely artifacts of remotely infarcted tissue MRI  Acute pontine infarct on the right, remote right inferior division MCA infarct 2D Echo EF 55 to 60%.  Left ventricle has normal function.  No regional wall abnormalities LDL 83   ROS:   14 system review of systems performed and negative with exception of those listed in HPI  PMH:  Past Medical History:  Diagnosis Date   Alcohol abuse 12/04/2016   Arthritis    Cocaine abuse (HCC) 12/02/2016   Hyperlipemia    Stroke (HCC)     PSH:  Past Surgical History:  Procedure Laterality Date   IR GENERIC HISTORICAL  12/02/2016   IR ANGIO VERTEBRAL SEL VERTEBRAL BILAT MOD SED 12/02/2016 Julieanne Cotton, MD [facility]   IR GENERIC HISTORICAL  12/02/2016   IR ANGIO INTRA EXTRACRAN SEL COM CAROTID INNOMINATE BILAT MOD SED  12/02/2016 Julieanne Cotton, MD [facility]   LOWER EXTREMITY ANGIOGRAPHY N/A 07/29/2019   Procedure: LOWER EXTREMITY ANGIOGRAPHY;  Surgeon: Chuck Hint, MD;  Location: [facility] INVASIVE CV LAB;  Service: Cardiovascular;  Laterality: N/A;    Social History:  Social History   Socioeconomic History   Marital status: Married    Spouse name: Not on file   Number of children: Not on file   Years of education: Not on file   Highest education level: Not on file  Occupational History   Not on file  Tobacco Use   Smoking status: Every Day    Packs/day: 0.25    Types: Cigars, Cigarettes   Smokeless tobacco: Never   Tobacco comments:    black mild 2 and three day smoke 4 per day  Vaping Use   Vaping Use: Never used  Substance and Sexual Activity   Alcohol use: Yes    Alcohol/week: 1.0 standard drink of alcohol    Types: 1 Cans of beer per week    Comment:  1 to  2 beers  weekly   Drug use: Not Currently    Types: Codeine, Cocaine    Comment: last use January 2019   Sexual activity: Never  Other Topics Concern   Not on file  Social History Narrative   Not on file   Social Determinants of Health   Financial Resource Strain: Not on file  Food Insecurity: Not on file  Transportation Needs: Not on file  Physical Activity: Not on file  Stress: Not on file  Social Connections: Not on file  Intimate Partner Violence: Not on file    Family History:  Family History  Problem Relation Age of Onset   Heart attack Father    Stroke Paternal Grandfather     Medications:   Current Outpatient Medications on File Prior to Visit  Medication Sig Dispense Refill   acetaminophen (TYLENOL) 650 MG CR tablet Take 650-1,300 mg by mouth every 8 (eight) hours as needed for pain.     atorvastatin (LIPITOR) 40 MG tablet Take 1 tablet (40 mg total) by mouth daily at 6 PM. (Patient taking differently: Take 40 mg by mouth daily.) 30 tablet 2   clopidogrel (PLAVIX) 75 MG tablet Take 1 tablet  (75 mg total) by mouth daily. 90 tablet 1   diclofenac (FLECTOR) 1.3 % PTCH Place 1 patch onto the skin See admin instructions. Apply 1 patch to the left knee two times a day as needed for pain- remove old one first     diclofenac sodium (VOLTAREN) 1 % GEL Apply 2-4 g topically 4 (four) times daily as needed (for hand or knee pain).     famotidine (PEPCID) 20 MG tablet Take 1 tablet (20 mg total) by mouth 2 (two) times daily. 30 tablet 0   gabapentin (NEURONTIN) 300 MG capsule Take 300-600 mg by mouth See admin instructions. Take 300 mg by mouth in the morning, 300 mg in the afternoon, and 600 mg at  bedtime     HYDROcodone-acetaminophen (NORCO) 10-325 MG tablet Take 1 tablet by mouth See admin instructions. Take 1 tablet by mouth every 4-5 hours     lisinopril (ZESTRIL) 20 MG tablet Take 20 mg by mouth daily.     No current facility-administered medications on file prior to visit.    Allergies:   Allergies  Allergen Reactions   Penicillins Anaphylaxis    Has patient had a PCN reaction causing immediate rash, facial/tongue/throat swelling, SOB or lightheadedness with hypotension: Yes Has patient had a PCN reaction causing severe rash involving mucus membranes or skin necrosis: No Has patient had a PCN reaction that required hospitalization Yes Has patient had a PCN reaction occurring within the last 10 years: No If all of the above answers are "NO", then may proceed with Cephalosporin use.       OBJECTIVE:  Physical Exam  Vitals:   06/05/22 1309  BP: (!) 146/91  Pulse: 87  Weight: 181 lb (82.1 kg)  Height: 5\' 10"  (1.778 m)    Body mass index is 25.97 kg/m. No results found.      No data to display           General: well developed, well nourished, seated, in no evident distress Head: head normocephalic and atraumatic.   Neck: supple with no carotid or supraclavicular bruits Cardiovascular: regular rate and rhythm, no murmurs Musculoskeletal: no deformity Skin:  no  rash/petichiae Vascular:  Normal pulses all extremities   Neurologic Exam Mental Status: Awake and fully alert.  Fluent speech and language.  Oriented to place and time. Recent and remote memory intact. Attention span, concentration and fund of knowledge appropriate. Mood and affect appropriate.  Cranial Nerves: Fundoscopic exam reveals sharp disc margins. Pupils equal, briskly reactive to light. Extraocular movements full without nystagmus. Visual fields full to confrontation. Hearing intact. Facial sensation intact. Face, tongue, palate moves normally and symmetrically. Shoulder shrug asymmetric on the left. Motor: Normal bulk and tone. Normal strength in RUE and RLE. 4/5 strength in LLE and 4/5 strength in the LUE. Left foot drop Sensory.: intact to touch , pinprick , position and vibratory sensation on right upper and lower extremities and LUE. Decreased sensation to the LLE.  Coordination: Finger-to-nose and heel-to-shin performed accurately on the right. Ataxia with the left side. Gait and Station: Arises from chair without difficulty. Left hemiplegic gait. Tandem walk and heel toe deferred.  Reflexes: 2+ and symmetric.  NIHSS  3 Modified Rankin  1   ASSESSMENT: Hector Booker is a 61 y.o. year old male recent discharged from hospital after suffering right pontine secondary to small vessel disease. Vascular risk factors include HTN, HLD, PAD, CAD, tobacco user, polysubstance abuse prior history of CVA, +opiates   PLAN:  Right Pontine Infarct : Residual deficit: Left side weakness. Continue Plavix and atorvastatin 40mg  QD for secondary stroke prevention.  Discussed secondary stroke prevention measures and importance of close PCP follow up for aggressive stroke risk factor management. I have gone over the pathophysiology of stroke, warning signs and symptoms, risk factors and their management in some detail with instructions to go to the closest emergency room for symptoms of concern. Also  discussed signs and symptoms of bleeding with patient and strongly enforced to seeking medical attention if an event occurs and he hits his head. HTN: BP goal <130/90. BP elevated today, continue lisinopril 20mg  QD per PCP, consider additional agents to reach BP goal. Patient will check BP at home daily and report  readings to PCP.  HLD: LDL goal <70. Recent LDL 83. Continue atorvastatin 40mg  QD per PCP. Advised to focus on well balanced diet low in saturated fats and carbohydrates.  Tobacco use: consider cessation Left sided weakness: continue PT exercises   Follow up as needed    CC:  [facility] provider: Dr. PCP: Pearlean Brownie, MD    I spent 30 minutes of face-to-face and non-face-to-face time with patient.  This included previsit chart review including review of recent hospitalization, lab review, study review, order entry, electronic health record documentation, patient education regarding recent stroke including etiology, secondary stroke prevention measures and importance of managing stroke risk factors, residual deficits and typical recovery time and answered all other questions to patient satisfaction   [name], [facility]  [facility] [address] [place], [place] Phone [phone] Fax [phone]

## 2022-06-05 NOTE — Patient Instructions (Signed)
Below is our plan:  We will continue to monitor. Keep doing PT exercises. I would encourage smoking cessation if you are willing. Continue atorvastatin and Plavix. No aspirin.   Please make sure you are staying well hydrated. I recommend 50-60 ounces daily. Well balanced diet and regular exercise encouraged. Consistent sleep schedule with 6-8 hours recommended.   Please continue follow up with care team as directed.   Follow up with me as needed   You may receive a survey regarding today's visit. I encourage you to leave honest feed back as I do use this information to improve patient care. Thank you for seeing me today!

## 2022-08-14 ENCOUNTER — Ambulatory Visit: Payer: Medicaid Other | Admitting: Vascular Surgery

## 2022-08-14 ENCOUNTER — Encounter (HOSPITAL_COMMUNITY): Payer: Medicaid Other

## 2022-08-14 ENCOUNTER — Other Ambulatory Visit (HOSPITAL_COMMUNITY): Payer: Medicaid Other

## 2022-08-28 ENCOUNTER — Ambulatory Visit (HOSPITAL_COMMUNITY): Payer: Medicaid Other

## 2022-08-28 ENCOUNTER — Ambulatory Visit: Payer: Medicaid Other | Admitting: Vascular Surgery

## 2022-08-28 ENCOUNTER — Encounter (HOSPITAL_COMMUNITY): Payer: Medicaid Other

## 2022-09-11 ENCOUNTER — Ambulatory Visit: Payer: Medicaid Other | Admitting: Vascular Surgery

## 2022-09-11 ENCOUNTER — Encounter (HOSPITAL_COMMUNITY): Payer: Medicaid Other

## 2022-09-11 ENCOUNTER — Other Ambulatory Visit (HOSPITAL_COMMUNITY): Payer: Medicaid Other

## 2022-10-17 ENCOUNTER — Other Ambulatory Visit: Payer: Self-pay | Admitting: *Deleted

## 2022-10-17 DIAGNOSIS — R109 Unspecified abdominal pain: Secondary | ICD-10-CM

## 2022-10-17 DIAGNOSIS — I739 Peripheral vascular disease, unspecified: Secondary | ICD-10-CM

## 2022-10-30 ENCOUNTER — Ambulatory Visit (INDEPENDENT_AMBULATORY_CARE_PROVIDER_SITE_OTHER): Payer: Medicaid Other | Admitting: Vascular Surgery

## 2022-10-30 ENCOUNTER — Encounter: Payer: Self-pay | Admitting: Vascular Surgery

## 2022-10-30 ENCOUNTER — Ambulatory Visit (INDEPENDENT_AMBULATORY_CARE_PROVIDER_SITE_OTHER)
Admission: RE | Admit: 2022-10-30 | Discharge: 2022-10-30 | Disposition: A | Discharge status: Home / Self Care | Payer: Medicaid Other | Source: Ambulatory Visit | Attending: Vascular Surgery | Admitting: Vascular Surgery

## 2022-10-30 ENCOUNTER — Ambulatory Visit (HOSPITAL_COMMUNITY)
Admission: RE | Admit: 2022-10-30 | Discharge: 2022-10-30 | Disposition: A | Discharge status: Home / Self Care | Payer: Medicaid Other | Source: Ambulatory Visit | Attending: Vascular Surgery | Admitting: Vascular Surgery

## 2022-10-30 VITALS — BP 177/110 | HR 71 | Temp 98.1°F | Resp 20 | Ht 70.0 in | Wt 185.2 lb

## 2022-10-30 DIAGNOSIS — I739 Peripheral vascular disease, unspecified: Secondary | ICD-10-CM | POA: Diagnosis not present

## 2022-10-30 DIAGNOSIS — R109 Unspecified abdominal pain: Secondary | ICD-10-CM | POA: Diagnosis present

## 2022-10-30 NOTE — Progress Notes (Signed)
REASON FOR VISIT:   Follow-up of peripheral arterial disease.  MEDICAL ISSUES:   PERIPHERAL ARTERIAL DISEASE: This patient has a known left external iliac artery occlusion.  Currently he is not having significant symptoms related to this but does have left knee pain and was being considered for a knee replacement prior to his stroke.  He actually has pretty reasonable Doppler signals in the left foot and an ABI of 73%.  However, if he did elect to proceed with knee replacement we would have to consider a right to left femoral-femoral bypass.  We have discussed importance of tobacco cessation.  I encouraged him to stay as active as possible.  He is on aspirin, plavix, and a statin.  I have ordered follow-up ABIs in 1 year and he will be seen on the PA schedule at that time.  However if he is to be considered for knee replacement and needs to be seen sooner, then I have asked his wife Hector Booker who I know well from previous surgery to be sure that I see him.  I have explained that I would retiring the end of September 2024.  HPI:   Hector Booker is a pleasant 61 y.o. male who I last saw on 11/21/2021.  He has a known left external iliac artery occlusion we had previously considered a right to left femorofemoral bypass.  You had been having claudication of the left leg and wanted to have a knee replacement.  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  So when I saw him last this was not the time to reconsider bypass.  I plan on seeing him back in 6 months and if his symptoms have progressed significantly we would consider a femoral-femoral bypass.  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  Since I saw him last, his main complaint is pain in the left knee related to his osteoarthritis.  I do not get any history of calf claudication although I think his activity is limited by his knee.  He denies any history of rest  pain or nonhealing ulcers.  He does continue to smoke 2 to 3 cigars a day and does inhale.  He denies any abdominal pain or back pain.  He is on aspirin and a statin.  Past Medical History:  Diagnosis Date   Alcohol abuse 12/04/2016   Arthritis    Cocaine abuse (HCC) 12/02/2016   Hyperlipemia    Stroke (HCC)     Family History  Problem Relation Age of Onset   Heart attack Father    Stroke Paternal Grandfather     SOCIAL HISTORY: Social History   Tobacco Use   Smoking status: Every Day    Packs/day: 0.25    Types: Cigars, Cigarettes   Smokeless tobacco: Never   Tobacco comments:    black mild 2 and three day smoke 4 per day  Substance Use Topics   Alcohol use: Yes    Alcohol/week: 1.0 standard drink of alcohol    Types: 1 Cans of beer per week    Comment:  1 to  2 beers  weekly    Allergies  Allergen Reactions   Penicillins Anaphylaxis    Has patient had a PCN reaction causing immediate rash, facial/tongue/throat swelling, SOB or lightheadedness with hypotension: Yes Has patient had a PCN reaction causing severe rash involving mucus membranes or skin necrosis: No  Has patient had a PCN reaction that required hospitalization Yes Has patient had a PCN reaction occurring within the last 10 years: No If all of the above answers are "NO", then may proceed with Cephalosporin use.     Current Outpatient Medications  Medication Sig Dispense Refill   acetaminophen (TYLENOL) 650 MG CR tablet Take 650-1,300 mg by mouth every 8 (eight) hours as needed for pain.     atorvastatin (LIPITOR) 40 MG tablet Take 1 tablet (40 mg total) by mouth daily at 6 PM. (Patient taking differently: Take 40 mg by mouth daily.) 30 tablet 2   clopidogrel (PLAVIX) 75 MG tablet Take 1 tablet (75 mg total) by mouth daily. 90 tablet 1   diclofenac (FLECTOR) 1.3 % PTCH Place 1 patch onto the skin See admin instructions. Apply 1 patch to the left knee two times a day as needed for pain- remove old one first      diclofenac sodium (VOLTAREN) 1 % GEL Apply 2-4 g topically 4 (four) times daily as needed (for hand or knee pain).     famotidine (PEPCID) 20 MG tablet Take 1 tablet (20 mg total) by mouth 2 (two) times daily. 30 tablet 0   gabapentin (NEURONTIN) 300 MG capsule Take 300-600 mg by mouth See admin instructions. Take 300 mg by mouth in the morning, 300 mg in the afternoon, and 600 mg at bedtime     HYDROcodone-acetaminophen (NORCO) 10-325 MG tablet Take 1 tablet by mouth See admin instructions. Take 1 tablet by mouth every 4-5 hours     lisinopril (ZESTRIL) 20 MG tablet Take 20 mg by mouth daily.     No current facility-administered medications for this visit.    REVIEW OF SYSTEMS:  [X]  denotes positive finding, [ ]  denotes negative finding Cardiac  Comments:  Chest pain or chest pressure:    Shortness of breath upon exertion:    Short of breath when lying flat:    Irregular heart rhythm:        Vascular    Pain in calf, thigh, or hip brought on by ambulation:    Pain in feet at night that wakes you up from your sleep:     Blood clot in your veins:    Leg swelling:         Pulmonary    Oxygen at home:    Productive cough:     Wheezing:         Neurologic    Sudden weakness in arms or legs:   History of stroke with left-sided weakness.  Sudden numbness in arms or legs:     Sudden onset of difficulty speaking or slurred speech:    Temporary loss of vision in one eye:     Problems with dizziness:         Gastrointestinal    Blood in stool:     Vomited blood:         Genitourinary    Burning when urinating:     Blood in urine:        Psychiatric    Major depression:         Hematologic    Bleeding problems:    Problems with blood clotting too easily:        Skin    Rashes or ulcers:        Constitutional    Fever or chills:     PHYSICAL EXAM:   Vitals:   10/30/22 0847  BP: (!) 177/110  Pulse: 71  Resp: 20  Temp: 98.1 F (36.7 C)  SpO2: 94%  Weight: 185  lb 3.2 oz (84 kg)  Height: 5\' 10"  (1.778 m)    GENERAL: The patient is a well-nourished male, in no acute distress. The vital signs are documented above. CARDIAC: There is a regular rate and rhythm.  VASCULAR: I do not detect bruits. On the right side he has a palpable femoral pulse and a palpable dorsalis pedis pulse. On the left side he does not have a femoral pulse nor pedal pulses. PULMONARY: There is good air exchange bilaterally without wheezing or rales. ABDOMEN: Soft and non-tender with normal pitched bowel sounds.  I do not palpate an aneurysm. MUSCULOSKELETAL: There are no major deformities or cyanosis. NEUROLOGIC: He has persistent left upper extremity weakness. SKIN: There are no ulcers or rashes noted. PSYCHIATRIC: The patient has a normal affect.  DATA:    ARTERIAL DOPPLER STUDY: I have independently interpreted his arterial Doppler study today.  On the right side there is a triphasic dorsalis pedis and posterior tibial signal.  ABIs 100%.  Toe pressures 167 mmHg.  On the left side there is a biphasic dorsalis pedis and posterior tibial signal.  ABI is 73%.  Toe pressures 172 mmHg.  DUPLEX ABDOMINAL AORTA: I have independently interpreted his duplex of the abdominal aorta.  The maximum diameter of his infrarenal aorta is 2.85 cm.  I would not consider this aneurysmal.  [facility] [phone]

## 2023-03-09 ENCOUNTER — Other Ambulatory Visit: Payer: Self-pay

## 2023-03-09 ENCOUNTER — Encounter (HOSPITAL_COMMUNITY): Payer: Medicaid Other

## 2023-03-09 ENCOUNTER — Telehealth: Payer: Self-pay

## 2023-03-09 DIAGNOSIS — I739 Peripheral vascular disease, unspecified: Secondary | ICD-10-CM

## 2023-03-09 NOTE — Telephone Encounter (Signed)
Pts wife [place] Sink) called into the office stating her husband needs a sooner appt to be seen due to worsening foot and leg pains and swelling. She reports that she is unsure as of how long his sxs have been present but she noticed the worsening swelling yesterday. She states she believes his pains to be at least 7-8/10. She states his feet are severely swollen, cold and painful. She stated he also has some pain in his legs. She denies pt having wounds. Pt scheduled for ABI (03/09/23) studies and OV (03/12/23). [place] Sink voiced her understanding.  Pt's wife called into office to canceled scheduled appts stating "He is refusing to come in because he feels he is fine". Appt cancelled and advised to proceed to the hospital if his sxs get worst and he need immediate attention.

## 2023-03-12 ENCOUNTER — Ambulatory Visit: Payer: Medicaid Other

## 2023-09-28 IMAGING — MR MR HEAD W/O CM
12 of 13 series · 44 of 48 positions shown · non-contrast
Comparison: Head CT from yesterday

CLINICAL DATA: Neuro deficit with acute stroke suspected. Substance
abuse and prior CVA with left-sided weakness and facial droop.

EXAM:
MRI HEAD WITHOUT CONTRAST
TECHNIQUE: Multiplanar, multiecho pulse sequences of the brain and surrounding
structures were obtained without intravenous contrast.

[Series 5: DWI · axial · 3.0mm · 0.92mm/px · z∈[-83,+88]mm · 7 of 116 slices shown (1 of 4)]
[im 1/116]
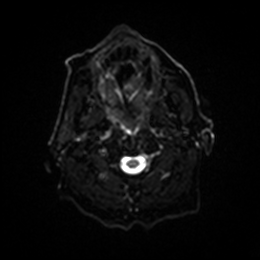
[im 20/116]
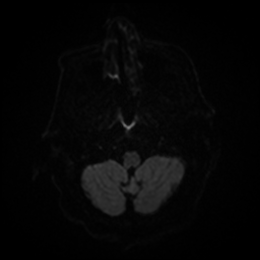
[im 39/116]
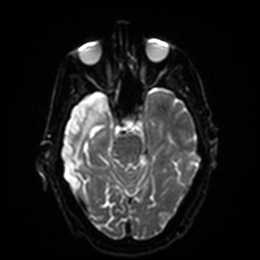
[im 58/116]
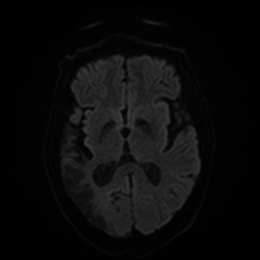
[im 77/116]
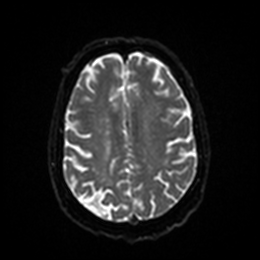
[im 96/116]
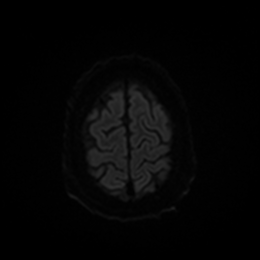
[im 116/116]
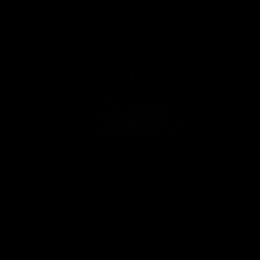

[Series 6: DWI · axial · 3.0mm · 0.92mm/px · z∈[-83,+85]mm · 4 of 54 slices shown (2 of 4)]
[im 1/54]
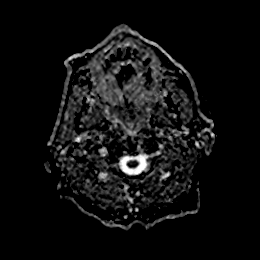
[im 18/54]
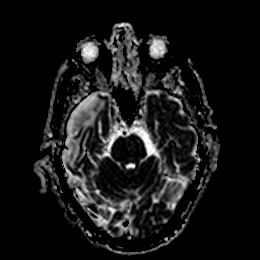
[im 36/54]
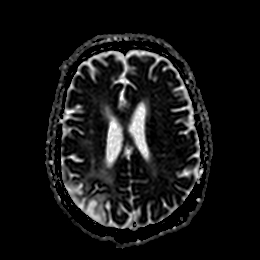
[im 54/54]
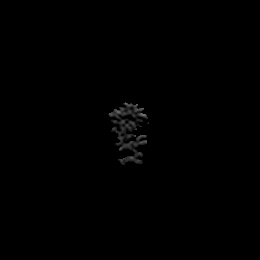

[Series 7: DWI · coronal · 4.0mm · 0.88mm/px · 6 of 82 slices shown (3 of 4)]
[im 1/82]
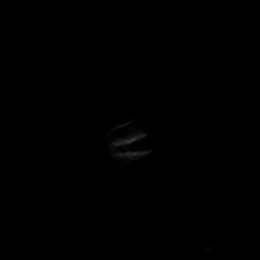
[im 17/82]
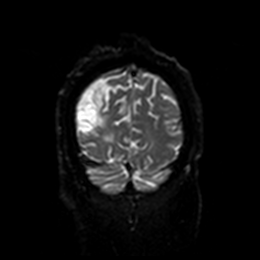
[im 33/82]
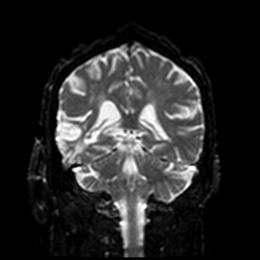
[im 49/82]
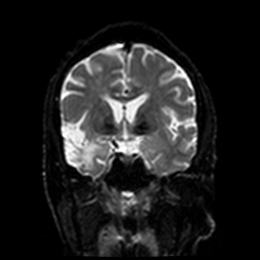
[im 65/82]
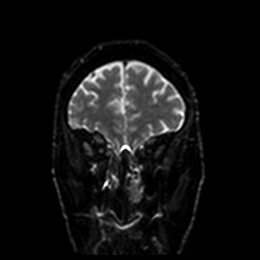
[im 82/82]
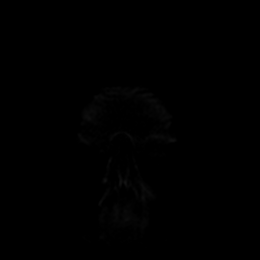

[Series 8: DWI · coronal · 4.0mm · 0.88mm/px · 3 of 41 slices shown (4 of 4)]
[im 1/41]
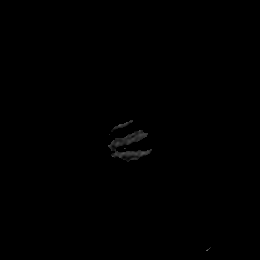
[im 21/41]
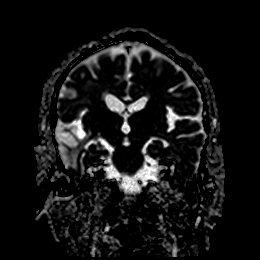
[im 41/41]
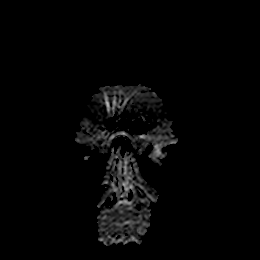

[Series 9: T1 · sagittal · 5.0mm · 0.78mm/px · 2 of 28 slices shown]
[im 1/28]
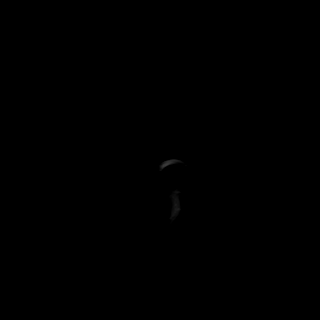
[im 28/28]
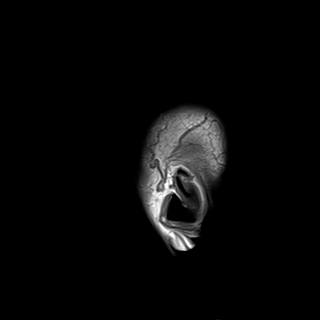

[Series 10: T2 · axial · 5.0mm · 0.75mm/px · z∈[-84,+89]mm · 2 of 30 slices shown (1 of 2)]
[im 1/30]
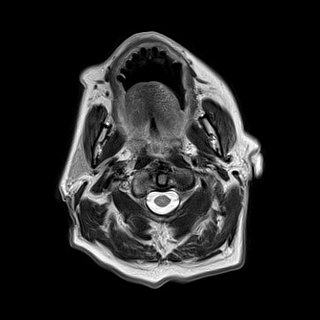
[im 30/30]
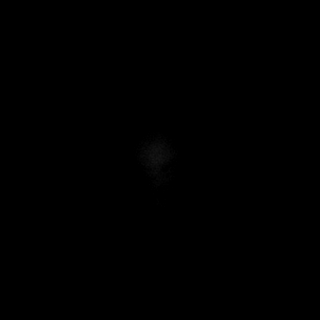

[Series 11: FLAIR · axial · 5.0mm · 0.47mm/px · z∈[-85,+89]mm · 2 of 30 slices shown]
[im 1/30]
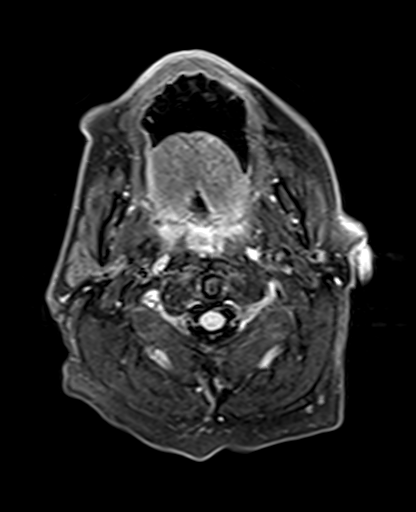
[im 30/30]
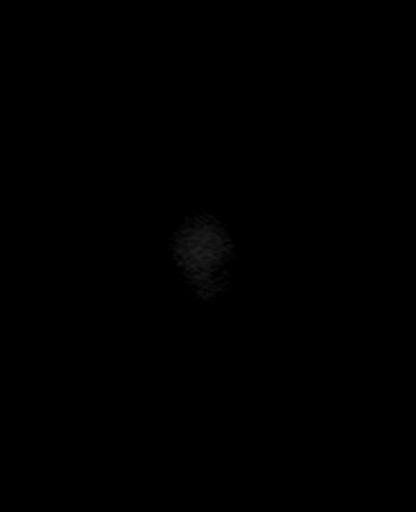

[Series 12: mag_images · axial · 3.0mm · 0.94mm/px · z∈[-82,+95]mm · 4 of 60 slices shown]
[im 1/60]
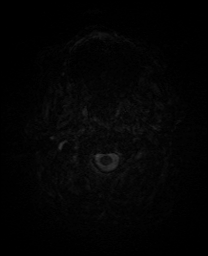
[im 20/60]
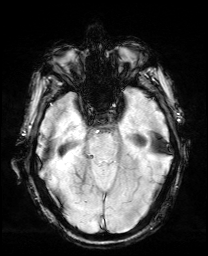
[im 40/60]
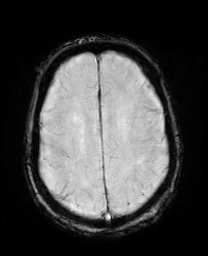
[im 60/60]
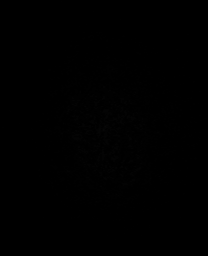

[Series 13: pha_images · axial · 3.0mm · 0.94mm/px · z∈[-82,+92]mm · 4 of 59 slices shown]
[im 1/59]
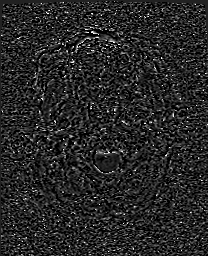
[im 20/59]
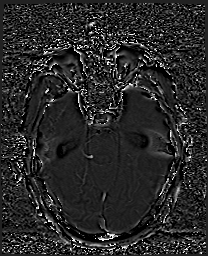
[im 39/59]
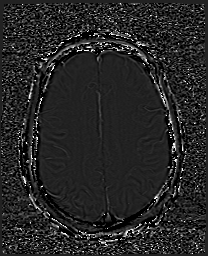
[im 59/59]
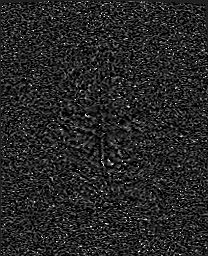

[Series 14: swi_images · axial · 3.0mm · 0.94mm/px · z∈[-82,+95]mm · 4 of 60 slices shown]
[im 1/60]
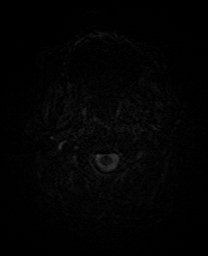
[im 20/60]
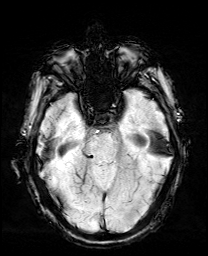
[im 40/60]
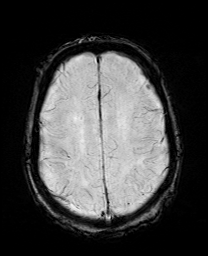
[im 60/60]
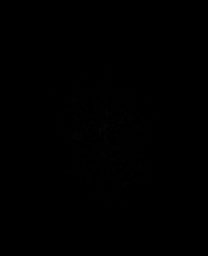

[Series 15: mip_images(sw) · axial · 24.0mm · 0.94mm/px · z∈[-71,+85]mm · 4 of 53 slices shown]
[im 1/53]
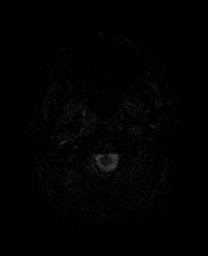
[im 18/53]
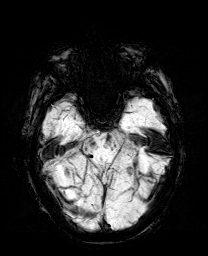
[im 35/53]
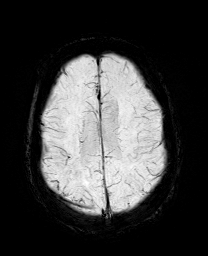
[im 53/53]
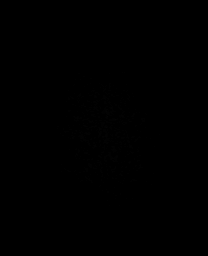

[Series 17: T2 · coronal · 5.0mm · 0.34mm/px · 2 of 35 slices shown (2 of 2)]
[im 1/35]
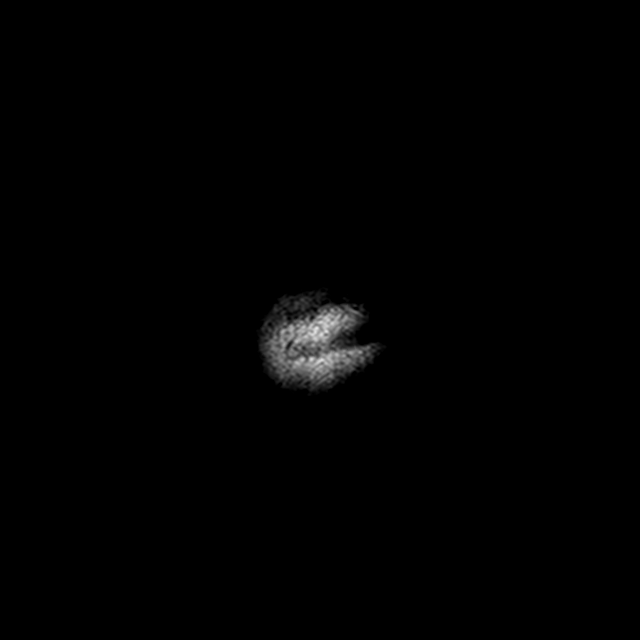
[im 35/35]
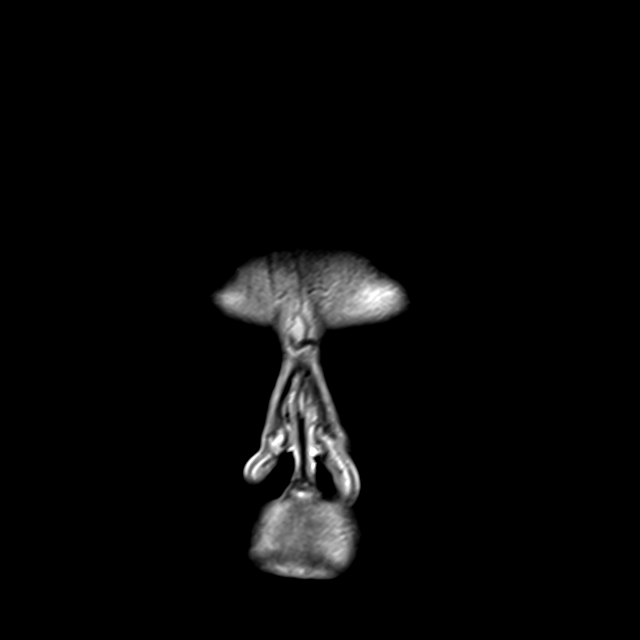

[44 of 48 positions shown; findings below may reference images not displayed]

FINDINGS: Brain: Acute perforator infarct in the right pons.

Remote inferior division right MCA distribution infarct primarily
affecting the superficial temporal lobe. No hemorrhage,
hydrocephalus, or collection.

Vascular: Absent right V3 flow void which is likely from diminished
flow when correlated with prior CTA.

Skull and upper cervical spine: Negative marrow signal.

Sinuses/Orbits: No emergent finding
IMPRESSION: 1. Acute pontine infarct on the right.
2. Remote right inferior division MCA infarct.

## 2023-12-17 ENCOUNTER — Emergency Department (HOSPITAL_COMMUNITY): Payer: Medicaid Other

## 2023-12-17 ENCOUNTER — Encounter (HOSPITAL_COMMUNITY): Payer: Self-pay | Admitting: Internal Medicine

## 2023-12-17 ENCOUNTER — Inpatient Hospital Stay (HOSPITAL_COMMUNITY)
Admission: EM | Admit: 2023-12-17 | Discharge: 2023-12-24 | DRG: 064 | Disposition: A | Discharge status: Skilled Nursing Facility | Payer: Medicaid Other | Attending: Student | Admitting: Student

## 2023-12-17 DIAGNOSIS — F17210 Nicotine dependence, cigarettes, uncomplicated: Secondary | ICD-10-CM | POA: Diagnosis present

## 2023-12-17 DIAGNOSIS — I69354 Hemiplegia and hemiparesis following cerebral infarction affecting left non-dominant side: Secondary | ICD-10-CM

## 2023-12-17 DIAGNOSIS — F17290 Nicotine dependence, other tobacco product, uncomplicated: Secondary | ICD-10-CM | POA: Diagnosis present

## 2023-12-17 DIAGNOSIS — Y92009 Unspecified place in unspecified non-institutional (private) residence as the place of occurrence of the external cause: Secondary | ICD-10-CM

## 2023-12-17 DIAGNOSIS — Z79899 Other long term (current) drug therapy: Secondary | ICD-10-CM | POA: Diagnosis not present

## 2023-12-17 DIAGNOSIS — E440 Moderate protein-calorie malnutrition: Secondary | ICD-10-CM | POA: Diagnosis present

## 2023-12-17 DIAGNOSIS — Z7902 Long term (current) use of antithrombotics/antiplatelets: Secondary | ICD-10-CM

## 2023-12-17 DIAGNOSIS — I6381 Other cerebral infarction due to occlusion or stenosis of small artery: Secondary | ICD-10-CM | POA: Diagnosis not present

## 2023-12-17 DIAGNOSIS — Z8249 Family history of ischemic heart disease and other diseases of the circulatory system: Secondary | ICD-10-CM | POA: Diagnosis not present

## 2023-12-17 DIAGNOSIS — M1711 Unilateral primary osteoarthritis, right knee: Secondary | ICD-10-CM

## 2023-12-17 DIAGNOSIS — Y9301 Activity, walking, marching and hiking: Secondary | ICD-10-CM | POA: Diagnosis present

## 2023-12-17 DIAGNOSIS — R29703 NIHSS score 3: Secondary | ICD-10-CM | POA: Diagnosis present

## 2023-12-17 DIAGNOSIS — S52515A Nondisplaced fracture of left radial styloid process, initial encounter for closed fracture: Secondary | ICD-10-CM | POA: Diagnosis not present

## 2023-12-17 DIAGNOSIS — I6509 Occlusion and stenosis of unspecified vertebral artery: Secondary | ICD-10-CM | POA: Diagnosis present

## 2023-12-17 DIAGNOSIS — E7849 Other hyperlipidemia: Secondary | ICD-10-CM | POA: Diagnosis not present

## 2023-12-17 DIAGNOSIS — M1710 Unilateral primary osteoarthritis, unspecified knee: Secondary | ICD-10-CM | POA: Diagnosis present

## 2023-12-17 DIAGNOSIS — J690 Pneumonitis due to inhalation of food and vomit: Secondary | ICD-10-CM | POA: Diagnosis present

## 2023-12-17 DIAGNOSIS — I6389 Other cerebral infarction: Secondary | ICD-10-CM | POA: Diagnosis not present

## 2023-12-17 DIAGNOSIS — E876 Hypokalemia: Secondary | ICD-10-CM | POA: Diagnosis present

## 2023-12-17 DIAGNOSIS — E785 Hyperlipidemia, unspecified: Secondary | ICD-10-CM | POA: Diagnosis present

## 2023-12-17 DIAGNOSIS — Z1152 Encounter for screening for COVID-19: Secondary | ICD-10-CM

## 2023-12-17 DIAGNOSIS — I639 Cerebral infarction, unspecified: Secondary | ICD-10-CM | POA: Diagnosis not present

## 2023-12-17 DIAGNOSIS — W19XXXA Unspecified fall, initial encounter: Secondary | ICD-10-CM | POA: Diagnosis not present

## 2023-12-17 DIAGNOSIS — Z880 Allergy status to penicillin: Secondary | ICD-10-CM | POA: Diagnosis not present

## 2023-12-17 DIAGNOSIS — R531 Weakness: Secondary | ICD-10-CM | POA: Diagnosis present

## 2023-12-17 DIAGNOSIS — R471 Dysarthria and anarthria: Secondary | ICD-10-CM | POA: Insufficient documentation

## 2023-12-17 DIAGNOSIS — I6930 Unspecified sequelae of cerebral infarction: Secondary | ICD-10-CM

## 2023-12-17 DIAGNOSIS — M179 Osteoarthritis of knee, unspecified: Secondary | ICD-10-CM | POA: Insufficient documentation

## 2023-12-17 DIAGNOSIS — S52125A Nondisplaced fracture of head of left radius, initial encounter for closed fracture: Secondary | ICD-10-CM | POA: Diagnosis present

## 2023-12-17 DIAGNOSIS — Y92002 Bathroom of unspecified non-institutional (private) residence single-family (private) house as the place of occurrence of the external cause: Secondary | ICD-10-CM | POA: Diagnosis not present

## 2023-12-17 DIAGNOSIS — R1310 Dysphagia, unspecified: Secondary | ICD-10-CM | POA: Diagnosis present

## 2023-12-17 DIAGNOSIS — G8929 Other chronic pain: Secondary | ICD-10-CM | POA: Diagnosis present

## 2023-12-17 DIAGNOSIS — Z6822 Body mass index (BMI) 22.0-22.9, adult: Secondary | ICD-10-CM | POA: Diagnosis not present

## 2023-12-17 DIAGNOSIS — M170 Bilateral primary osteoarthritis of knee: Secondary | ICD-10-CM | POA: Diagnosis not present

## 2023-12-17 DIAGNOSIS — I10 Essential (primary) hypertension: Secondary | ICD-10-CM | POA: Diagnosis present

## 2023-12-17 DIAGNOSIS — W010XXA Fall on same level from slipping, tripping and stumbling without subsequent striking against object, initial encounter: Secondary | ICD-10-CM | POA: Diagnosis present

## 2023-12-17 DIAGNOSIS — J189 Pneumonia, unspecified organism: Secondary | ICD-10-CM

## 2023-12-17 DIAGNOSIS — E7800 Pure hypercholesterolemia, unspecified: Secondary | ICD-10-CM | POA: Diagnosis not present

## 2023-12-17 LAB — CBC
HCT: 41.1 % (ref 39.0–52.0)
Hemoglobin: 14.1 g/dL (ref 13.0–17.0)
MCH: 29.9 pg (ref 26.0–34.0)
MCHC: 34.3 g/dL (ref 30.0–36.0)
MCV: 87.1 fL (ref 80.0–100.0)
Platelets: 248 10*3/uL (ref 150–400)
RBC: 4.72 MIL/uL (ref 4.22–5.81)
RDW: 13.9 % (ref 11.5–15.5)
WBC: 14.3 10*3/uL — ABNORMAL HIGH (ref 4.0–10.5)
nRBC: 0 % (ref 0.0–0.2)

## 2023-12-17 LAB — ETHANOL: Alcohol, Ethyl (B): <10 mg/dL (ref <10)

## 2023-12-17 LAB — DIFFERENTIAL
Abs Immature Granulocytes: 0.04 10*3/uL (ref 0.00–0.07)
Basophils Absolute: 0.1 10*3/uL (ref 0.0–0.1)
Basophils Relative: 0 %
Eosinophils Absolute: 0.1 10*3/uL (ref 0.0–0.5)
Eosinophils Relative: 0 %
Immature Granulocytes: 0 %
Lymphocytes Relative: 17 %
Lymphs Abs: 2.5 10*3/uL (ref 0.7–4.0)
Monocytes Absolute: 0.9 10*3/uL (ref 0.1–1.0)
Monocytes Relative: 6 %
Neutro Abs: 10.8 10*3/uL — ABNORMAL HIGH (ref 1.7–7.7)
Neutrophils Relative %: 77 %

## 2023-12-17 LAB — COMPREHENSIVE METABOLIC PANEL
ALT: 12 U/L (ref 0–44)
AST: 20 U/L (ref 15–41)
Albumin: 2.9 g/dL — ABNORMAL LOW (ref 3.5–5.0)
Alkaline Phosphatase: 84 U/L (ref 38–126)
Anion gap: 12 (ref 5–15)
BUN: 8 mg/dL (ref 8–23)
CO2: 30 mmol/L (ref 22–32)
Calcium: 8.9 mg/dL (ref 8.9–10.3)
Chloride: 96 mmol/L — ABNORMAL LOW (ref 98–111)
Creatinine, Ser: 0.65 mg/dL (ref 0.61–1.24)
GFR, Estimated: >60 mL/min (ref >60)
Glucose, Bld: 81 mg/dL (ref 70–99)
Potassium: 3 mmol/L — ABNORMAL LOW (ref 3.5–5.1)
Sodium: 138 mmol/L (ref 135–145)
Total Bilirubin: 1.3 mg/dL — ABNORMAL HIGH (ref 0.0–1.2)
Total Protein: 6.5 g/dL (ref 6.5–8.1)

## 2023-12-17 LAB — APTT: aPTT: 37 s — ABNORMAL HIGH (ref 24–36)

## 2023-12-17 LAB — RESP PANEL BY RT-PCR (RSV, FLU A&B, COVID)  RVPGX2
Influenza A by PCR: NEGATIVE
Influenza B by PCR: NEGATIVE
Resp Syncytial Virus by PCR: NEGATIVE
SARS Coronavirus 2 by RT PCR: NEGATIVE

## 2023-12-17 LAB — HIV ANTIBODY (ROUTINE TESTING W REFLEX): HIV Screen 4th Generation wRfx: NONREACTIVE

## 2023-12-17 LAB — PROTIME-INR
INR: 1.2 (ref 0.8–1.2)
Prothrombin Time: 15 s (ref 11.4–15.2)

## 2023-12-17 MED ORDER — HYDROCODONE-ACETAMINOPHEN 7.5-325 MG PO TABS
1.0000 | ORAL_TABLET | Freq: Four times a day (QID) | ORAL | Status: DC | PRN
  Administered 2023-12-17 – 2023-12-24 (×17): 1 via ORAL
  Filled 2023-12-17 (×18): qty 1

## 2023-12-17 MED ORDER — LISINOPRIL 20 MG PO TABS
20.0000 mg | ORAL_TABLET | Freq: Every day | ORAL | Status: DC
  Administered 2023-12-18 – 2023-12-21 (×4): 20 mg via ORAL
  Filled 2023-12-17 (×4): qty 1

## 2023-12-17 MED ORDER — ASPIRIN 81 MG PO TBEC
81.0000 mg | DELAYED_RELEASE_TABLET | Freq: Every day | ORAL | Status: DC
Start: 2023-12-17 — End: 2023-12-24
  Administered 2023-12-17 – 2023-12-24 (×8): 81 mg via ORAL
  Filled 2023-12-17 (×8): qty 1

## 2023-12-17 MED ORDER — SODIUM CHLORIDE 0.9% FLUSH
3.0000 mL | Freq: Two times a day (BID) | INTRAVENOUS | Status: DC
  Administered 2023-12-18 – 2023-12-24 (×10): 3 mL via INTRAVENOUS

## 2023-12-17 MED ORDER — LACTATED RINGERS IV BOLUS
1000.0000 mL | Freq: Once | INTRAVENOUS | Status: AC
  Administered 2023-12-17: 1000 mL via INTRAVENOUS

## 2023-12-17 MED ORDER — SODIUM CHLORIDE 0.9 % IV SOLN: 250.0000 mL | INTRAVENOUS | Status: AC | PRN

## 2023-12-17 MED ORDER — LEVOFLOXACIN IN D5W 750 MG/150ML IV SOLN: 750.0000 mg | INTRAVENOUS | Status: DC

## 2023-12-17 MED ORDER — ACETAMINOPHEN 650 MG RE SUPP: 650.0000 mg | Freq: Four times a day (QID) | RECTAL | Status: DC | PRN

## 2023-12-17 MED ORDER — LEVOFLOXACIN IN D5W 750 MG/150ML IV SOLN
750.0000 mg | Freq: Once | INTRAVENOUS | Status: AC
  Administered 2023-12-17: 750 mg via INTRAVENOUS
  Filled 2023-12-17: qty 150

## 2023-12-17 MED ORDER — DICLOFENAC SODIUM 1 % EX GEL
2.0000 g | Freq: Four times a day (QID) | CUTANEOUS | Status: DC | PRN
  Administered 2023-12-21: 2 g via TOPICAL
  Filled 2023-12-17: qty 100

## 2023-12-17 MED ORDER — ONDANSETRON HCL 4 MG/2ML IJ SOLN
4.0000 mg | Freq: Once | INTRAMUSCULAR | Status: AC
Start: 2023-12-17 — End: 2023-12-17
  Administered 2023-12-17: 4 mg via INTRAVENOUS
  Filled 2023-12-17: qty 2

## 2023-12-17 MED ORDER — SODIUM CHLORIDE 0.9% FLUSH: 3.0000 mL | INTRAVENOUS | Status: DC | PRN

## 2023-12-17 MED ORDER — ONDANSETRON HCL 4 MG PO TABS
4.0000 mg | ORAL_TABLET | Freq: Four times a day (QID) | ORAL | Status: DC | PRN
  Administered 2023-12-17 – 2023-12-18 (×2): 4 mg via ORAL
  Filled 2023-12-17 (×2): qty 1

## 2023-12-17 MED ORDER — FAMOTIDINE 20 MG PO TABS
20.0000 mg | ORAL_TABLET | Freq: Two times a day (BID) | ORAL | Status: DC
  Administered 2023-12-17 – 2023-12-24 (×14): 20 mg via ORAL
  Filled 2023-12-17 (×14): qty 1

## 2023-12-17 MED ORDER — ATORVASTATIN CALCIUM 40 MG PO TABS: 40.0000 mg | ORAL_TABLET | Freq: Every day | ORAL | Status: DC

## 2023-12-17 MED ORDER — ONDANSETRON HCL 4 MG/2ML IJ SOLN: 4.0000 mg | Freq: Four times a day (QID) | INTRAMUSCULAR | Status: DC | PRN

## 2023-12-17 MED ORDER — METRONIDAZOLE 500 MG/100ML IV SOLN
500.0000 mg | Freq: Two times a day (BID) | INTRAVENOUS | Status: DC
  Administered 2023-12-17 – 2023-12-20 (×7): 500 mg via INTRAVENOUS
  Filled 2023-12-17 (×7): qty 100

## 2023-12-17 MED ORDER — SODIUM CHLORIDE 0.9% FLUSH
3.0000 mL | Freq: Two times a day (BID) | INTRAVENOUS | Status: DC
  Administered 2023-12-18 – 2023-12-24 (×11): 3 mL via INTRAVENOUS

## 2023-12-17 MED ORDER — CLOPIDOGREL BISULFATE 75 MG PO TABS
75.0000 mg | ORAL_TABLET | Freq: Every day | ORAL | Status: DC
  Administered 2023-12-18 – 2023-12-24 (×7): 75 mg via ORAL
  Filled 2023-12-17 (×7): qty 1

## 2023-12-17 MED ORDER — MORPHINE SULFATE (PF) 4 MG/ML IV SOLN
4.0000 mg | Freq: Once | INTRAVENOUS | Status: AC
  Administered 2023-12-17: 4 mg via INTRAVENOUS
  Filled 2023-12-17: qty 1

## 2023-12-17 MED ORDER — ATORVASTATIN CALCIUM 80 MG PO TABS
80.0000 mg | ORAL_TABLET | Freq: Every day | ORAL | Status: DC
  Administered 2023-12-18 – 2023-12-23 (×6): 80 mg via ORAL
  Filled 2023-12-17 (×6): qty 1

## 2023-12-17 MED ORDER — HYDROCODONE-ACETAMINOPHEN 10-325 MG PO TABS: 1.0000 | ORAL_TABLET | ORAL | Status: DC

## 2023-12-17 MED ORDER — ACETAMINOPHEN 325 MG PO TABS
650.0000 mg | ORAL_TABLET | Freq: Four times a day (QID) | ORAL | Status: DC | PRN
  Administered 2023-12-19 – 2023-12-20 (×2): 650 mg via ORAL
  Filled 2023-12-17 (×2): qty 2

## 2023-12-17 MED ORDER — POTASSIUM CHLORIDE 10 MEQ/100ML IV SOLN
10.0000 meq | INTRAVENOUS | Status: AC
  Administered 2023-12-17 – 2023-12-18 (×4): 10 meq via INTRAVENOUS
  Filled 2023-12-17: qty 100

## 2023-12-17 MED ORDER — SODIUM CHLORIDE 0.9 % IV SOLN
2.0000 g | INTRAVENOUS | Status: DC
  Administered 2023-12-17 – 2023-12-20 (×4): 2 g via INTRAVENOUS
  Filled 2023-12-17 (×4): qty 20

## 2023-12-17 NOTE — Progress Notes (Signed)
Orthopedic Tech Progress Note Patient Details:  Hector Booker 02/18/1961 [id number] Left sling at bedside, nurse was taking patient's blood. Ortho Devices Type of Ortho Device: Sling immobilizer Ortho Device/Splint Location: LUE Ortho Device/Splint Interventions: Ordered, Application, Adjustment   Post Interventions Patient Tolerated: Well Instructions Provided: Adjustment of device, Care of device  Blase Mess 12/17/2023, 8:58 PM

## 2023-12-17 NOTE — ED Triage Notes (Signed)
Patient BIB [facility] from home for weakness and slurred speech starting Tuesday night. Patient has hx of stroke with weakness and the only new symptom is dysphasia and unable to ambulate. EMS also reports patient takes oxycodone for pain but no signs of overdose assessed.

## 2023-12-17 NOTE — ED Notes (Signed)
Patient to MRI.

## 2023-12-17 NOTE — Plan of Care (Signed)

## 2023-12-17 NOTE — ED Provider Notes (Signed)
[facility] AT [facility] Provider Note   CSN: [id number] Arrival date & time: 12/17/23  1347     History  Chief Complaint  Patient presents with   Slurred Speech   Weakness    Hector Booker is a 63 y.o. male.  Patient is a 63 year old male with a history of hyperlipidemia, prior substance abuse, stroke and hypertension who is presenting today from home due to weakness.  Patient lives at home and gives a very vague history however after speaking with his wife and daughter they report that on Saturday he fell when he was walking in the bathroom.  He lost his balance which he usually walks with a walker and fell.  He required his wife to help him get up and back into the bed.  She states since that time he has had worsening slurred speech and has been unable to get out of the bed.  He is complained of pain to his left arm and leg.  She reports that he had a stroke several years ago and he always has weakness on his left side however he normally can get around with a walker and now has not.  She feels that his speech is more slurred and he has complained of headache with occasional nausea and has been coughing some on his food.  Patient reports he is having difficulty swallowing.  Also stating he just feels weak.  He does take Plavix but denies any visual changes.  Family also feels that he has been a little bit confused.  He has not had a fever or persistent cough.  The history is provided by the patient, a relative and medical records.  Weakness      Home Medications Prior to Admission medications   Medication Sig Start Date End Date Taking? Authorizing Provider  acetaminophen (TYLENOL) 650 MG CR tablet Take 650-1,300 mg by mouth every 8 (eight) hours as needed for pain.    [provider]  atorvastatin (LIPITOR) 40 MG tablet Take 1 tablet (40 mg total) by mouth daily at 6 PM. Patient taking differently: Take 40 mg by mouth daily. 12/04/16   Layne Benton, NP  clopidogrel (PLAVIX) 75 MG tablet Take 1 tablet (75 mg total) by mouth daily. 03/02/18   Marvel Plan, MD  diclofenac (FLECTOR) 1.3 % PTCH Place 1 patch onto the skin See admin instructions. Apply 1 patch to the left knee two times a day as needed for pain- remove old one first    [provider]  diclofenac sodium (VOLTAREN) 1 % GEL Apply 2-4 g topically 4 (four) times daily as needed (for hand or knee pain).    [provider]  famotidine (PEPCID) 20 MG tablet Take 1 tablet (20 mg total) by mouth 2 (two) times daily. 03/20/17   Rancour, Jeannett Senior, MD  gabapentin (NEURONTIN) 300 MG capsule Take 300-600 mg by mouth See admin instructions. Take 300 mg by mouth in the morning, 300 mg in the afternoon, and 600 mg at bedtime    [provider]  HYDROcodone-acetaminophen (NORCO) 10-325 MG tablet Take 1 tablet by mouth See admin instructions. Take 1 tablet by mouth every 4-5 hours 03/19/17   [provider]  lisinopril (ZESTRIL) 20 MG tablet Take 20 mg by mouth daily. 03/31/19   [provider]      Allergies    Penicillins    Review of Systems   Review of Systems  Neurological:  Positive for  weakness.    Physical Exam Updated Vital Signs BP 125/78 (BP Location: Left Arm)   Pulse 68   Temp 98.1 F (36.7 C) (Oral)   Resp 18   SpO2 100%  Physical Exam Vitals and nursing note reviewed.  Constitutional:      General: He is not in acute distress.    Appearance: He is well-developed.  HENT:     Head: Normocephalic and atraumatic.  Eyes:     Conjunctiva/sclera: Conjunctivae normal.     Pupils: Pupils are equal, round, and reactive to light.  Cardiovascular:     Rate and Rhythm: Normal rate and regular rhythm.     Pulses: Normal pulses.     Heart sounds: No murmur heard. Pulmonary:     Effort: Pulmonary effort is normal. No respiratory distress.     Breath sounds: Normal breath sounds. No wheezing or rales.  Abdominal:     General:  There is no distension.     Palpations: Abdomen is soft.     Tenderness: There is no abdominal tenderness. There is no guarding or rebound.  Musculoskeletal:        General: Tenderness present. Normal range of motion.     Cervical back: Normal range of motion and neck supple.     Comments: Left knee is held at a 90 degree angle with pain with palpation of the knee and unable to straighten  Skin:    General: Skin is warm and dry.     Findings: No erythema or rash.  Neurological:     Mental Status: He is alert and oriented to person, place, and time.     Comments: Left upper and lower ext weakness 3 out of 5 and mild pronator drift in both.  No notable facial droop.  Patient's speech is accurate but slightly slurred and slowed.  Normal finger-to-nose on the right hand.  Visual fields are intact.  Psychiatric:        Behavior: Behavior normal.     ED Results / Procedures / Treatments   Labs (all labs ordered are listed, but only abnormal results are displayed) Labs Reviewed  APTT - Abnormal; Notable for the following components:      Result Value   aPTT 37 (*)    All other components within normal limits  CBC - Abnormal; Notable for the following components:   WBC 14.3 (*)    All other components within normal limits  DIFFERENTIAL - Abnormal; Notable for the following components:   Neutro Abs 10.8 (*)    All other components within normal limits  COMPREHENSIVE METABOLIC PANEL - Abnormal; Notable for the following components:   Potassium 3.0 (*)    Chloride 96 (*)    Albumin 2.9 (*)    Total Bilirubin 1.3 (*)    All other components within normal limits  RESP PANEL BY RT-PCR (RSV, FLU A&B, COVID)  [id number]  ETHANOL  PROTIME-INR  RAPID URINE DRUG SCREEN, HOSP PERFORMED  URINALYSIS, W/ REFLEX TO CULTURE (INFECTION SUSPECTED)    EKG None  Radiology CT HEAD WO CONTRAST ( ) Result Date: 12/17/2023 CLINICAL DATA:  Neuro deficit, acute, stroke suspected EXAM: CT HEAD WITHOUT  CONTRAST TECHNIQUE: Contiguous axial images were obtained from the base of the skull through the vertex without intravenous contrast. RADIATION DOSE REDUCTION: This exam was performed according to the departmental dose-optimization program which includes automated exposure control, adjustment of the mA and/or kV according to patient size and/or use of iterative reconstruction technique. COMPARISON:  Head CT 11/07/2021 FINDINGS: Brain: No hemorrhage. No hydrocephalus. No extra-axial fluid collection. No mass effect. No mass lesion. There is chronic posterior right MCA territory infarct and a chronic right pontine infarct. No hemorrhage. No hydrocephalus. No extra-axial fluid collection. No mass effect. No mass lesion. Vascular: No hyperdense vessel or unexpected calcification. Skull: Normal. Negative for fracture or focal lesion. Sinuses/Orbits: N mild mucosal thickening in the right maxillary sinus. Orbits are unremarkable. O middle ear or mastoid effusion Other: None. IMPRESSION: 1. No hemorrhage or CT evidence of an acute cortical infarct. 2. Chronic posterior right MCA territory infarct and chronic right pontine infarct. Electronically Signed   By: Lorenza Cambridge M.D.   On: 12/17/2023 17:44   DG Pelvis 1-2 Views Result Date: 12/17/2023 CLINICAL DATA:  Fall, weakness. EXAM: PELVIS - 1-2 VIEW COMPARISON:  None Available. FINDINGS: The cortical margins of the bony pelvis are intact. No fracture. Pubic symphysis and sacroiliac joints are congruent. Bilateral hip degenerative change. Both femoral heads are well-seated in the respective acetabula. Vascular and seminal vesicle calcifications. IMPRESSION: No pelvic fracture. Electronically Signed   By: Narda Rutherford M.D.   On: 12/17/2023 17:35   DG Elbow Complete Left Result Date: 12/17/2023 CLINICAL DATA:  Fall, weakness. EXAM: LEFT ELBOW - COMPLETE 3+ VIEW COMPARISON:  None Available. FINDINGS: There is mild irregularity of the radial head that may represent a  nondisplaced fracture. No additional fracture. Normal alignment, no dislocation. There is a prominent anterior fat pad without definite joint effusion. IMPRESSION: Irregularity of the radial head may represent a nondisplaced fracture. Consider follow-up exam in 7-10 days. Electronically Signed   By: Narda Rutherford M.D.   On: 12/17/2023 17:34   DG Knee 2 Views Left Result Date: 12/17/2023 CLINICAL DATA:  Fall, weakness. EXAM: LEFT KNEE - 1-2 VIEW COMPARISON:  None Available. FINDINGS: No acute fracture or dislocation. Lateral tibiofemoral joint space narrowing. Moderate tricompartmental peripheral spurring. 15 mm ossified body in Hoffa's fat pad. Minimal knee joint effusion. No erosions or focal bone abnormality. IMPRESSION: 1. No acute fracture or dislocation. 2. Moderate tricompartmental osteoarthritis.  Small joint effusion. 3. Ossified body within Hoffa's fat pad. Electronically Signed   By: Narda Rutherford M.D.   On: 12/17/2023 17:32   DG Chest 2 View Result Date: 12/17/2023 CLINICAL DATA:  Weakness, fall. EXAM: CHEST - 2 VIEW COMPARISON:  Remote radiograph 09/28/2011 FINDINGS: Mild patchy opacity at the right lung base. Normal heart size and mediastinal contours. No pulmonary edema, pleural effusion or pneumothorax. No displaced rib fracture or acute osseous findings. IMPRESSION: Patchy right lower lobe opacity is nonspecific in the setting of fall. This may represent pneumonia in the appropriate clinical setting. Pulmonary contusion could appear similar. Electronically Signed   By: Narda Rutherford M.D.   On: 12/17/2023 17:31    Procedures Procedures    Medications Ordered in ED Medications  levofloxacin (LEVAQUIN) IVPB 750 mg (has no administration in time range)  morphine (PF) 4 MG/ML injection 4 mg (4 mg Intravenous Given 12/17/23 1813)  ondansetron (ZOFRAN) injection 4 mg (4 mg Intravenous Given 12/17/23 1814)  lactated ringers bolus 1,000 mL (1,000 mLs Intravenous New Bag/Given 12/17/23  1824)    ED Course/ Medical Decision Making/ A&P                                 Medical Decision Making Amount and/or Complexity of Data Reviewed External Data Reviewed: notes. Labs: ordered. Decision-making  details documented in ED Course. Radiology: ordered and independent interpretation performed. Decision-making details documented in ED Course. ECG/medicine tests: ordered and independent interpretation performed. Decision-making details documented in ED Course.  Risk Prescription drug management. Decision regarding hospitalization.   Pt with multiple medical problems and comorbidities and presenting today with a complaint that caries a high risk for morbidity and mortality.  Here today with complaints of weakness.  Patient had a fall and after that time has been bedridden complaining of pain in his left leg but also family member noticing slurred speech and he is describing dysphagia as well as a headache.  Concern for intracranial hemorrhage from the fall versus new stroke versus underlying infectious etiology causing generalized weakness and exacerbation of his prior stroke.  He denies any respiratory symptoms concerning for pneumonia or acute cardiac pathology.  7:29 PM I independently interpreted patient's labs and EKG.  EKG without acute findings, CBC with new leukocytosis of 14, alcohol level is normal, PT and PTT without acute findings, CMP with normal renal function but hypokalemia of 3.0 today with normal LFTs.  I have independently visualized and interpreted pt's images today.  Head CT without evidence of acute bleed.  Radiology reports no acute findings but chronic posterior right MCA territory infarct.  Also chest x-ray with concern for right lower lobe opacity.  Radiology reports that this could be pneumonia versus pulmonary contusion.  Also report moderate tricompartmental arthritis and a small joint effusion on his left knee but no evidence of pelvic fracture on imaging.   Also elbow images show irregularity of the radial head which could be a nondisplaced fracture.  Patient placed in a sling.  Given his findings of weakness, being unable to get up and walk and the coughing on his food concerned that maybe there is a stroke that did not show up on CT and we will do an MRI to ensure dysphagia is not related to stroke.  Also will give Levaquin for concern for pneumonia and patient has anaphylaxis to penicillins.  Discussed this with the patient and his brother.  Will admit for further care.  They are comfortable with this plan.          Final Clinical Impression(s) / ED Diagnoses Final diagnoses:  Weakness  Hypokalemia  Pneumonia of right lower lobe due to infectious organism  Closed nondisplaced fracture of head of left radius, initial encounter  Fall, initial encounter    Rx / DC Orders ED Discharge Orders     None         Gwyneth Sprout, MD 12/17/23 1929

## 2023-12-17 NOTE — H&P (Addendum)
History and Physical    Hector Booker UJW:[id number] DOB: 26-Jun-1961 DOA: 12/17/2023  PCP: [name], MD   Patient coming from: Home   Chief Complaint:  Chief Complaint  Patient presents with   Slurred Speech   Weakness   ED TRIAGE note:  Patient BIB [facility] from home for weakness and slurred speech starting Tuesday night. Patient has hx of stroke with weakness and the only new symptom is dysphasia and unable to ambulate. EMS also reports patient takes oxycodone for pain but no signs of overdose assessed.     HPI:  Hector Booker is a 63 y.o. male with medical history significant of CVA with residual left sided weakness, essential hypertension and hyperlipidemia presented to emergency department with complaining of weakness and slurred speech started on Tuesday.atient lives at home and gives a very vague history however after speaking with his wife and daughter they report that on Saturday he fell when he was walking in the bathroom.  He lost his balance which he usually walks with a walker and fell.  He required his wife to help him get up and back into the bed.  She states since that time he has had worsening slurred speech and has been unable to get out of the bed.  He is complained of pain to his left arm and leg.  She reports that he had a stroke several years ago and he always has weakness on his left side however he normally can get around with a walker and now has not.  She feels that his speech is more slurred and he has complained of headache with occasional nausea.  Patient reports he is having difficulty swallowing.  Also stating he just feels weak. Patient denies any chest pain, palpitation, shortness of breath, headache, vision, tremor, dizziness, tingling, sensory change, focal weakness, seizure, loss of consciousness.  ED Course:  Presentation to ED patient is hemodynamically stable. CMP showing low potassium 3 low albumin 2.9, elevated bilirubin 1.3 otherwise  unremarkable. CBC showing mild acute cytosis 14.3 otherwise normal.  Pending APTT and INR. Blood alcohol level less than 10. Pending respiratory panel.  Extensive imaging following- Chest x-ray showed patchy right lower lobe normal in specific infiltrate represent pneumonia.  Pulmonary contusion also could be appear similar.  X-ray of the left knee no acute fracture or dislocation.  Moderate osteoarthritis and small joint effusion.  X-ray of the pelvis no evidence of fracture.  Bilateral hip degenerative change.  X-ray of the left elbow: Irregularity of the radial head may represent a nondisplaced fracture. Consider follow-up exam in 7-10 days.  CT head no hemorrhage and acute intracranial infraction.  Chronic posterior right MCA territory and chronic right pontine infarction.  Pending MRI of the brain.  In the ED patient has been given morphine 4 mg and 1 L of LR bolus.  Given patient is complaining about dysphagia and there is some concern for aspiration with associated WBC count as well and chest x-ray showing concern for pneumonia in the ED patient has been started treating with IV Levaquin.  History of penicillin allergy. Also for the ongoing dysphagia MRI has been ordered to rule out any new stroke.  Pending swallow screen in the ED.  Hospitalist has been contacted for further evaluation management of aspiration pneumonia, generalized weakness, nondisplaced fracture of the left radial head, mechanical fall and dysphagia.   Significant labs in the ED: Lab Orders         Resp panel by RT-PCR (  RSV, Flu A&B, Covid) Anterior Nasal Swab         Culture, blood (Routine X 2) w Reflex to ID Panel         Expectorated Sputum Assessment w Gram Stain, Rflx to Resp Cult         Ethanol         Protime-INR         APTT         CBC         Differential         Comprehensive metabolic panel         Urine rapid drug screen (hosp performed)         Urinalysis, w/ Reflex to Culture  (Infection Suspected) -Urine, Clean Catch         HIV Antibody (routine testing w rflx)         Comprehensive metabolic panel         CBC         Hemoglobin A1c         Lipid panel       Review of Systems:  Review of Systems  Constitutional:  Positive for malaise/fatigue. Negative for chills, fever and weight loss.  Eyes:  Negative for blurred vision and double vision.  Respiratory:  Negative for cough, sputum production, shortness of breath and wheezing.   Cardiovascular:  Negative for chest pain, palpitations and leg swelling.  Gastrointestinal:  Negative for abdominal pain, heartburn, nausea and vomiting.  Musculoskeletal:  Positive for falls and joint pain. Negative for back pain, myalgias and neck pain.  Skin:  Negative for itching and rash.  Neurological:  Negative for dizziness, tingling, tremors, sensory change, speech change, focal weakness, seizures, loss of consciousness, weakness and headaches.  Psychiatric/Behavioral:  The patient is not nervous/anxious.     Past Medical History:  Diagnosis Date   Alcohol abuse 12/04/2016   Arthritis    Cocaine abuse (HCC) 12/02/2016   Hyperlipemia    Stroke [facility])     Past Surgical History:  Procedure Laterality Date   IR GENERIC HISTORICAL  12/02/2016   IR ANGIO VERTEBRAL SEL VERTEBRAL BILAT MOD SED 12/02/2016 Julieanne Cotton, MD [facility]   IR GENERIC HISTORICAL  12/02/2016   IR ANGIO INTRA EXTRACRAN SEL COM CAROTID INNOMINATE BILAT MOD SED 12/02/2016 Julieanne Cotton, MD [facility]   LOWER EXTREMITY ANGIOGRAPHY N/A 07/29/2019   Procedure: LOWER EXTREMITY ANGIOGRAPHY;  Surgeon: Chuck Hint, MD;  Location: [facility] INVASIVE CV LAB;  Service: Cardiovascular;  Laterality: N/A;     reports that he has been smoking cigars and cigarettes. He has never used smokeless tobacco. He reports current alcohol use of about 1.0 standard drink of alcohol per week. He reports that he does not currently use drugs after having used the  following drugs: Codeine and Cocaine.  Allergies  Allergen Reactions   Penicillins Anaphylaxis    Has patient had a PCN reaction causing immediate rash, facial/tongue/throat swelling, SOB or lightheadedness with hypotension: Yes Has patient had a PCN reaction causing severe rash involving mucus membranes or skin necrosis: No Has patient had a PCN reaction that required hospitalization Yes Has patient had a PCN reaction occurring within the last 10 years: No If all of the above answers are "NO", then may proceed with Cephalosporin use.     Family History  Problem Relation Age of Onset   Heart attack Father    Stroke Paternal Grandfather     Prior to Admission  medications   Medication Sig Start Date End Date Taking? Authorizing Provider  acetaminophen (TYLENOL) 650 MG CR tablet Take 650-1,300 mg by mouth every 8 (eight) hours as needed for pain.    [provider]  atorvastatin (LIPITOR) 40 MG tablet Take 1 tablet (40 mg total) by mouth daily at 6 PM. Patient taking differently: Take 40 mg by mouth daily. 12/04/16   Layne Benton, NP  clopidogrel (PLAVIX) 75 MG tablet Take 1 tablet (75 mg total) by mouth daily. 03/02/18   Marvel Plan, MD  diclofenac (FLECTOR) 1.3 % PTCH Place 1 patch onto the skin See admin instructions. Apply 1 patch to the left knee two times a day as needed for pain- remove old one first    [provider]  diclofenac sodium (VOLTAREN) 1 % GEL Apply 2-4 g topically 4 (four) times daily as needed (for hand or knee pain).    [provider]  famotidine (PEPCID) 20 MG tablet Take 1 tablet (20 mg total) by mouth 2 (two) times daily. 03/20/17   Rancour, Jeannett Senior, MD  gabapentin (NEURONTIN) 300 MG capsule Take 300-600 mg by mouth See admin instructions. Take 300 mg by mouth in the morning, 300 mg in the afternoon, and 600 mg at bedtime    [provider]  HYDROcodone-acetaminophen (NORCO) 10-325 MG tablet Take 1 tablet by mouth See admin  instructions. Take 1 tablet by mouth every 4-5 hours 03/19/17   [provider]  lisinopril (ZESTRIL) 20 MG tablet Take 20 mg by mouth daily. 03/31/19   [provider]     Physical Exam: Vitals:   12/17/23 1351 12/17/23 1357 12/17/23 2038  BP:  125/78 (!) 105/94  Pulse:  68 72  Resp:  18 18  Temp:  98.1 F (36.7 C) 98.1 F (36.7 C)  TempSrc:  Oral Oral  SpO2: 96% 100% 99%  Weight:   72.1 kg  Height:   5\' 10"  (1.778 m)    Physical Exam Constitutional:      General: He is not in acute distress.    Appearance: He is not ill-appearing.  HENT:     Head: Normocephalic and atraumatic.     Mouth/Throat:     Mouth: Mucous membranes are moist.  Eyes:     Conjunctiva/sclera: Conjunctivae normal.     Pupils: Pupils are equal, round, and reactive to light.  Cardiovascular:     Rate and Rhythm: Normal rate and regular rhythm.     Pulses: Normal pulses.     Heart sounds: Normal heart sounds.  Pulmonary:     Effort: Pulmonary effort is normal.     Breath sounds: Normal breath sounds.  Abdominal:     General: Bowel sounds are normal.  Musculoskeletal:        General: No tenderness, deformity or signs of injury.     Cervical back: Normal range of motion.     Right lower leg: No edema.     Left lower leg: No edema.  Skin:    Capillary Refill: Capillary refill takes less than 2 seconds.  Neurological:     Mental Status: He is alert and oriented to person, place, and time. Mental status is at baseline.     Cranial Nerves: No cranial nerve deficit.     Sensory: No sensory deficit.     Coordination: Coordination normal.     Comments: Left-sided upper extremity chronic weakness muscle strength 4/5 Right-sided upper and lower extremities muscle strength 5/5. No sensory deficit.  Psychiatric:        Mood and Affect: Mood normal.      Labs on Admission: I have personally reviewed following labs and imaging studies  CBC: Recent Labs  Lab 12/17/23 1741  WBC 14.3*   NEUTROABS 10.8*  HGB 14.1  HCT 41.1  MCV 87.1  PLT 248   Basic Metabolic Panel: Recent Labs  Lab 12/17/23 1741  NA 138  K 3.0*  CL 96*  CO2 30  GLUCOSE 81  BUN 8  CREATININE 0.65  CALCIUM 8.9   GFR: Estimated Creatinine Clearance: 97.6 mL/min (by C-G formula based on SCr of 0.65 mg/dL). Liver Function Tests: Recent Labs  Lab 12/17/23 1741  AST 20  ALT 12  ALKPHOS 84  BILITOT 1.3*  PROT 6.5  ALBUMIN 2.9*   No results for input(s): "LIPASE", "AMYLASE" in the last 168 hours. No results for input(s): "AMMONIA" in the last 168 hours. Coagulation Profile: Recent Labs  Lab 12/17/23 1741  INR 1.2   Cardiac Enzymes: No results for input(s): "CKTOTAL", "CKMB", "CKMBINDEX", "TROPONINI", "TROPONINIHS" in the last 168 hours. BNP (last 3 results) No results for input(s): "BNP" in the last 8760 hours. HbA1C: No results for input(s): "HGBA1C" in the last 72 hours. CBG: No results for input(s): "GLUCAP" in the last 168 hours. Lipid Profile: No results for input(s): "CHOL", "HDL", "LDLCALC", "TRIG", "CHOLHDL", "LDLDIRECT" in the last 72 hours. Thyroid Function Tests: No results for input(s): "TSH", "T4TOTAL", "FREET4", "T3FREE", "THYROIDAB" in the last 72 hours. Anemia Panel: No results for input(s): "VITAMINB12", "FOLATE", "FERRITIN", "TIBC", "IRON", "RETICCTPCT" in the last 72 hours. Urine analysis:    Component Value Date/Time   COLORURINE YELLOW 01/29/2017 1841   APPEARANCEUR CLEAR 01/29/2017 1841   LABSPEC 1.028 01/29/2017 1841   PHURINE 5.0 01/29/2017 1841   GLUCOSEU NEGATIVE 01/29/2017 1841   HGBUR NEGATIVE 01/29/2017 1841   BILIRUBINUR NEGATIVE 01/29/2017 1841   KETONESUR NEGATIVE 01/29/2017 1841   PROTEINUR NEGATIVE 01/29/2017 1841   NITRITE NEGATIVE 01/29/2017 1841   LEUKOCYTESUR NEGATIVE 01/29/2017 1841    Radiological Exams on Admission: I have personally reviewed images MR BRAIN WO CONTRAST Result Date: 12/17/2023 CLINICAL DATA:  Neuro deficit,  acute, stroke suspected EXAM: MRI HEAD WITHOUT CONTRAST TECHNIQUE: Multiplanar, multiecho pulse sequences of the brain and surrounding structures were obtained without intravenous contrast. COMPARISON:  MRI 11/01/2021. FINDINGS: Brain: Acute left pontine infarct. Small acute infarcts in the high left frontal white matter. Mild edema without mass effect. Additional scattered T2/FLAIR hyperintensity in the white matter are compatible with chronic microvascular ischemic disease. Remote right posterior MCA territory infarct. Remote right pontine infarct. No evidence of acute hemorrhage, mass lesion, midline shift or hydrocephalus. Evidence of prior hemorrhage associated with the prior right MCA territory infarct. Vascular: Major arterial flow voids are maintained at the skull base. Skull and upper cervical spine: Normal marrow signal. Sinuses/Orbits: Clear sinuses.  No acute orbital findings. Other: No mastoid effusions. IMPRESSION: 1. Acute infarcts in the left pons and high left frontal white matter. 2. Remote right posterior MCA territory and right pontine infarcts. Electronically Signed   By: Feliberto Harts M.D.   On: 12/17/2023 21:52   CT HEAD WO CONTRAST ( ) Result Date: 12/17/2023 CLINICAL DATA:  Neuro deficit, acute, stroke suspected EXAM: CT HEAD WITHOUT CONTRAST TECHNIQUE: Contiguous axial images were obtained from the base of the skull through the vertex without intravenous contrast. RADIATION DOSE REDUCTION: This exam was performed according to the departmental dose-optimization program which includes automated exposure control, adjustment  of the mA and/or kV according to patient size and/or use of iterative reconstruction technique. COMPARISON:  Head CT 11/07/2021 FINDINGS: Brain: No hemorrhage. No hydrocephalus. No extra-axial fluid collection. No mass effect. No mass lesion. There is chronic posterior right MCA territory infarct and a chronic right pontine infarct. No hemorrhage. No hydrocephalus.  No extra-axial fluid collection. No mass effect. No mass lesion. Vascular: No hyperdense vessel or unexpected calcification. Skull: Normal. Negative for fracture or focal lesion. Sinuses/Orbits: N mild mucosal thickening in the right maxillary sinus. Orbits are unremarkable. O middle ear or mastoid effusion Other: None. IMPRESSION: 1. No hemorrhage or CT evidence of an acute cortical infarct. 2. Chronic posterior right MCA territory infarct and chronic right pontine infarct. Electronically Signed   By: Lorenza Cambridge M.D.   On: 12/17/2023 17:44   DG Pelvis 1-2 Views Result Date: 12/17/2023 CLINICAL DATA:  Fall, weakness. EXAM: PELVIS - 1-2 VIEW COMPARISON:  None Available. FINDINGS: The cortical margins of the bony pelvis are intact. No fracture. Pubic symphysis and sacroiliac joints are congruent. Bilateral hip degenerative change. Both femoral heads are well-seated in the respective acetabula. Vascular and seminal vesicle calcifications. IMPRESSION: No pelvic fracture. Electronically Signed   By: Narda Rutherford M.D.   On: 12/17/2023 17:35   DG Elbow Complete Left Result Date: 12/17/2023 CLINICAL DATA:  Fall, weakness. EXAM: LEFT ELBOW - COMPLETE 3+ VIEW COMPARISON:  None Available. FINDINGS: There is mild irregularity of the radial head that may represent a nondisplaced fracture. No additional fracture. Normal alignment, no dislocation. There is a prominent anterior fat pad without definite joint effusion. IMPRESSION: Irregularity of the radial head may represent a nondisplaced fracture. Consider follow-up exam in 7-10 days. Electronically Signed   By: Narda Rutherford M.D.   On: 12/17/2023 17:34   DG Knee 2 Views Left Result Date: 12/17/2023 CLINICAL DATA:  Fall, weakness. EXAM: LEFT KNEE - 1-2 VIEW COMPARISON:  None Available. FINDINGS: No acute fracture or dislocation. Lateral tibiofemoral joint space narrowing. Moderate tricompartmental peripheral spurring. 15 mm ossified body in Hoffa's fat pad.  Minimal knee joint effusion. No erosions or focal bone abnormality. IMPRESSION: 1. No acute fracture or dislocation. 2. Moderate tricompartmental osteoarthritis.  Small joint effusion. 3. Ossified body within Hoffa's fat pad. Electronically Signed   By: Narda Rutherford M.D.   On: 12/17/2023 17:32   DG Chest 2 View Result Date: 12/17/2023 CLINICAL DATA:  Weakness, fall. EXAM: CHEST - 2 VIEW COMPARISON:  Remote radiograph 09/28/2011 FINDINGS: Mild patchy opacity at the right lung base. Normal heart size and mediastinal contours. No pulmonary edema, pleural effusion or pneumothorax. No displaced rib fracture or acute osseous findings. IMPRESSION: Patchy right lower lobe opacity is nonspecific in the setting of fall. This may represent pneumonia in the appropriate clinical setting. Pulmonary contusion could appear similar. Electronically Signed   By: Narda Rutherford M.D.   On: 12/17/2023 17:31     EKG: Pending EKG   Assessment/Plan: Principal Problem:   Aspiration pneumonia [facility]) Active Problems:   Ischemic stroke (HCC)   History of CVA with residual deficit of the left side   Fall at home, initial encounter   Dysphagia   Nondisplaced fracture of left radial head fracture   Hyperlipidemia   Essential hypertension   Knee osteoarthritis   Dysarthria   Hypokalemia    Assessment and Plan: Aspiration pneumonia - Patient's family reporting that patient has some dysarthria and dysphagia since Tuesday.  He has history of previous CVA with residual left-sided weakness  and had a mechanical fall at home while taking shower. -X-ray showed pneumonia versus pulmonary contusion. - Currently patient is hemodynamically stable O2 sat 100% room air. - Given patient has mild leukocytosis in the ED patient has been started treating with aspiration pneumonia with Levaquin given history of anaphylaxis with penicillin group medications. - Pending respiratory panel, blood cultures and sputum cultures -  Continue IV Levaquin 750 mg and metronidazole 500 mg twice daily for the management of aspiration pneumonia. -Continue to monitor fever, WBC count.  Will follow-up with blood cultures sputum cultures result. - Continue supportive care and pain control with home Norco 1 tablet every 6 hours as needed for moderate-severe pain - Continue supplemental oxygen as needed, spirometry and pulmonary toiletry. Addendum - After speaking with pharmacy  changing Levaquin to ceftriaxone given Levaquin has more risk for anaphylaxis as compared to ceftriaxone group given patient has history of anaphylaxis with penicillin group in the past.  Mechanical fall at home > Patient presenting with complaining of a mechanical fall at home over the weekend and since Tuesday patient has developed dysarthria and dysphagia which has been ongoing.  Of note patient has history of CVA residual left-sided weakness and uses a walker at home. - In the ED extensive imaging has been done which showed left-sided radial and nondisplaced fracture.  CT head unremarkable.  Pending MRI to rule out any new stroke. - Consulting inpatient PT and OT for evaluation of balance. -Given MRI did not show any evidence of intracranial hemorrhage starting pharmacological DVT prophylaxis.   Acute ischemic CVA-likely embolic source History of CVA with residual left-sided deficit Dysarthria and dysphagia-resolved -Patient had a mechanical fall at home over the weekend.  Since Tuesday patient's family has been noticed patient has some dysarthria and dysphagia which is ongoing. During my evaluation at the baseline patient has slowed thought process and having slow process of making sentence.  Patient denies any swallowing difficulty but he is stating that he eats soft food at home.  Denies any swallowing problem. -Patient has history of CVA with residual left-sided weakness and he use walker at home. -Obtaining swallow screen in the ED. - Presentation to  ED patient is hemodynamically stable - CT head no evidence of new stroke. - Pending MRI of the brain - Obtaining stroke swallow screen. -Continue neurocheck every 4 hours - Continue Lipitor 40 mg daily and Plavix 75 mg daily - Consulted inpatient PT and OT for evaluation of balance. -MRI shows any evidence of new stroke in that case will reach out to neurology for recommendation. Addendum - MRI showing that acute infraction of the left pons and high left frontal white matter. -Consulting neurology for further recommendation. - Currently patient is on Plavix 75 mg daily and increasing statin 40 to 80 mg. - Given patient's symptom of dysarthria and dysphagia being started Tuesday, 12/15/2023 in the morning he is out of window for thrombectomy. -Obtaining CT angiogram head and neck. - Checking A1c, lipid panel - Pending EKG.  Obtaining echocardiogram. - Continue cardiac monitoring. - Spoke with neurology Dr. Amada Jupiter stated that add aspirin 81 mg alongside with continue Plavix 75 mg daily and recommended full stroke workup. -Update, patient's RN reported that patient for swallow screen but he had coughed with thin liquid.  Consulting speech for evaluation.  Continue aspiration precaution   Left radial head nondisplaced fracture -X-ray of the elbow showed irregularity of the radial head represent nondisplaced fracture.  Recommended follow-up exam in 7 to 10 days. -  Consulted [facility] Dr. Aundria Rud for further recommendation - In the ED left-sided forearm sling has been placed. -Spoke with Hand surgeon with [facility] group and per discussion given it is a nondisplaced fracture can continue the sling for the patient's comfort however there is no need for further x-ray or outpatient follow-up.  Of note patient also has sided upper and lower extremity weakness from previous stroke. -Continue conservative management include pain control, continue the sling for patient comfort.  Hypokalemia -  Low potassium level 3.  Replating with IV KCl 37M EQ x 4 doses.  Essential hypertension -Continue lisinopril.  Hyperlipidemia -Continue Lipitor  Chronic knee osteoarthritis -Patient has history of knee osteoarthritis.  Complaining about bilateral knee joint pain.  X-ray of the knee joint ruled out any fracture and dislocation. - Continue Norco as needed and Voltaren gel. -Consulted inpatient PT and OT for evaluation -On discharge patient need to establish care with physical therapy and pain management.   DVT prophylaxis:  SCDs.  And Lovenox Code Status:  Full Code Diet: Patient reported that he is soft food at home.  Ordered dysphagia diet. Family Communication:   Family was present at bedside, at the time of interview.  Opportunity was given to ask question and all questions were answered satisfactorily.  Disposition Plan: Pending MRI of the brain, blood culture, sputum culture, PT evaluation. Consults: Orthopedic surgeon, PT and OT evaluation Admission status:   Inpatient, Telemetry bed  Severity of Illness: The appropriate patient status for this patient is INPATIENT. Inpatient status is judged to be reasonable and necessary in order to provide the required intensity of service to ensure the patient's safety. The patient's presenting symptoms, physical exam findings, and initial radiographic and laboratory data in the context of their chronic comorbidities is felt to place them at high risk for further clinical deterioration. Furthermore, it is not anticipated that the patient will be medically stable for discharge from the hospital within 2 midnights of admission.   * I certify that at the point of admission it is my clinical judgment that the patient will require inpatient hospital care spanning beyond 2 midnights from the point of admission due to high intensity of service, high risk for further deterioration and high frequency of surveillance required.Marland Kitchen    Tereasa Coop,  MD [facility]  How to contact the [facility] Attending or Consulting provider 7A - 7P or covering provider during after hours 7P -7A, for this patient.  Check the care team in [facility] and look for a) attending/consulting [facility] provider listed and b) the [facility] team listed Log into [email or web address] and use [facility] universal password to access. If you do not have the password, please contact the hospital operator. Locate the [facility] provider you are looking for under [facility] and page to a number that you can be directly reached. If you still have difficulty reaching the provider, please page the [facility] (Director on Call) for the Hospitalists listed on amion for assistance.  12/18/2023, 1:43 AM

## 2023-12-18 ENCOUNTER — Inpatient Hospital Stay (HOSPITAL_COMMUNITY): Payer: Medicaid Other

## 2023-12-18 DIAGNOSIS — I6381 Other cerebral infarction due to occlusion or stenosis of small artery: Secondary | ICD-10-CM | POA: Diagnosis not present

## 2023-12-18 DIAGNOSIS — J690 Pneumonitis due to inhalation of food and vomit: Secondary | ICD-10-CM | POA: Diagnosis not present

## 2023-12-18 DIAGNOSIS — I6389 Other cerebral infarction: Secondary | ICD-10-CM | POA: Diagnosis not present

## 2023-12-18 DIAGNOSIS — I639 Cerebral infarction, unspecified: Secondary | ICD-10-CM | POA: Diagnosis not present

## 2023-12-18 LAB — COMPREHENSIVE METABOLIC PANEL
ALT: 12 U/L (ref 0–44)
AST: 20 U/L (ref 15–41)
Albumin: 2.5 g/dL — ABNORMAL LOW (ref 3.5–5.0)
Alkaline Phosphatase: 67 U/L (ref 38–126)
Anion gap: 13 (ref 5–15)
BUN: 6 mg/dL — ABNORMAL LOW (ref 8–23)
CO2: 27 mmol/L (ref 22–32)
Calcium: 8.5 mg/dL — ABNORMAL LOW (ref 8.9–10.3)
Chloride: 98 mmol/L (ref 98–111)
Creatinine, Ser: 0.65 mg/dL (ref 0.61–1.24)
GFR, Estimated: >60 mL/min (ref >60)
Glucose, Bld: 122 mg/dL — ABNORMAL HIGH (ref 70–99)
Potassium: 2.8 mmol/L — ABNORMAL LOW (ref 3.5–5.1)
Sodium: 138 mmol/L (ref 135–145)
Total Bilirubin: 1.1 mg/dL (ref 0.0–1.2)
Total Protein: 5.7 g/dL — ABNORMAL LOW (ref 6.5–8.1)

## 2023-12-18 LAB — CBC
HCT: 35.7 % — ABNORMAL LOW (ref 39.0–52.0)
Hemoglobin: 12.4 g/dL — ABNORMAL LOW (ref 13.0–17.0)
MCH: 30.1 pg (ref 26.0–34.0)
MCHC: 34.7 g/dL (ref 30.0–36.0)
MCV: 86.7 fL (ref 80.0–100.0)
Platelets: 209 10*3/uL (ref 150–400)
RBC: 4.12 MIL/uL — ABNORMAL LOW (ref 4.22–5.81)
RDW: 13.7 % (ref 11.5–15.5)
WBC: 7.8 10*3/uL (ref 4.0–10.5)
nRBC: 0 % (ref 0.0–0.2)

## 2023-12-18 LAB — ECHOCARDIOGRAM COMPLETE
AR max vel: 2.5 cm2
AV Area VTI: 2.41 cm2
AV Area mean vel: 2.43 cm2
AV Mean grad: 4 mm[Hg]
AV Peak grad: 7.5 mm[Hg]
Ao pk vel: 1.37 m/s
Area-P 1/2: 2.62 cm2
Height: 70 in
P 1/2 time: 960 ms
Weight: 2543.23 [oz_av]

## 2023-12-18 LAB — LIPID PANEL
Cholesterol: 85 mg/dL (ref 0–200)
HDL: 21 mg/dL — ABNORMAL LOW (ref >40)
LDL Cholesterol: 45 mg/dL (ref 0–99)
Total CHOL/HDL Ratio: 4 {ratio}
Triglycerides: 94 mg/dL (ref <150)
VLDL: 19 mg/dL (ref 0–40)

## 2023-12-18 LAB — HEMOGLOBIN A1C
Hgb A1c MFr Bld: 5.1 % (ref 4.8–5.6)
Mean Plasma Glucose: 99.67 mg/dL

## 2023-12-18 MED ORDER — ENOXAPARIN SODIUM 40 MG/0.4ML IJ SOSY
40.0000 mg | PREFILLED_SYRINGE | INTRAMUSCULAR | Status: DC
  Administered 2023-12-18 – 2023-12-24 (×7): 40 mg via SUBCUTANEOUS
  Filled 2023-12-18 (×7): qty 0.4

## 2023-12-18 MED ORDER — IOHEXOL 350 MG/ML SOLN
75.0000 mL | Freq: Once | INTRAVENOUS | Status: AC | PRN
  Administered 2023-12-18: 75 mL via INTRAVENOUS

## 2023-12-18 MED ORDER — FLUTICASONE PROPIONATE 50 MCG/ACT NA SUSP
1.0000 | Freq: Every day | NASAL | Status: DC
  Administered 2023-12-18 – 2023-12-23 (×6): 1 via NASAL
  Filled 2023-12-18 (×2): qty 16

## 2023-12-18 NOTE — Evaluation (Signed)
Occupational Therapy Evaluation Patient Details Name: Hector Booker MRN: [id number] DOB: 1961/08/28 Today's Date: 12/18/2023   History of Present Illness   Pt is a 63 y/o M presenting to ED with weakness and slurred speech after sustaining fall at home. MRI revealed acute infarcts and found to have pneumonia. PMH includes CVA with residual left sided weakness, essential hypertension and hyperlipidemia.     Clinical Impressions Pt currently at min assist level for simulated toilet transfers and LB bathing with mod assist for LB dressing.  Uses RW for mobility and prior to admission he had PRN supervision/assist from his spouse and other family per report.  Recent fall secondary to being alone and losing his balance during attempted transfer to the bathroom.  Feel he will benefit from acute care OT at this time to increase independence and safety with basic ADLs and toileting tasks in order to return home at a more independent level.  Feel he will benefit from post acute inpatient follow-up therapy, >3 hours/day to reach a safe level of consistency.        If plan is discharge home, recommend the following:   Assistance with cooking/housework;Assist for transportation;Help with stairs or ramp for entrance;A little help with bathing/dressing/bathroom     Functional Status Assessment   Patient has had a recent decline in their functional status and demonstrates the ability to make significant improvements in function in a reasonable and predictable amount of time.     Equipment Recommendations   BSC/3in1     Recommendations for Other Services   Rehab consult     Precautions/Restrictions   Precautions Precautions: Fall Precaution/Restrictions Comments: L hemiparesis Restrictions Weight Bearing Restrictions Per Provider Order: No Other Position/Activity Restrictions: LUE sling for comfort per orders     Mobility Bed Mobility Overal bed mobility: Needs Assistance Bed  Mobility: Rolling, Sidelying to Sit Rolling: Contact guard assist Sidelying to sit: Min assist   Sit to supine: Min assist   General bed mobility comments: Min assist for pushing up to sitting from left sidelying.    Transfers Overall transfer level: Needs assistance Equipment used: Rolling walker (2 wheels) Transfers: Sit to/from Stand, Bed to chair/wheelchair/BSC Sit to Stand: Min assist     Step pivot transfers: Min assist     General transfer comment: Min assist for ambulation with RW, slower rate of speed with slight synergy pattern noted on the LLE.      Balance Overall balance assessment: Needs assistance Sitting-balance support: Feet supported, Bilateral upper extremity supported Sitting balance-Leahy Scale: Fair Sitting balance - Comments: Static sitting balance supervision with min assist for dynamic during LB dressing tasks.   Standing balance support: Bilateral upper extremity supported Standing balance-Leahy Scale: Poor Standing balance comment: Pt needs UE support for standing balance and mobility.                           ADL either performed or assessed with clinical judgement   ADL Overall ADL's : Needs assistance/impaired Eating/Feeding: Modified independent;Sitting   Grooming: Wash/dry hands;Wash/dry face;Contact guard assist;Sitting   Upper Body Bathing: Supervision/ safety;Sitting   Lower Body Bathing: Minimal assistance;Sit to/from stand   Upper Body Dressing : Minimal assistance;Sitting   Lower Body Dressing: Moderate assistance;Sit to/from stand   Toilet Transfer: Minimal assistance;Ambulation;Rolling walker (2 wheels);Comfort height toilet;Grab bars   Toileting- Clothing Manipulation and Hygiene: Minimal assistance;Sit to/from stand       Functional mobility during ADLs: Minimal assistance;Rolling walker (  2 wheels) (ambulation in the room) General ADL Comments: Pt reports having to have some assist at home for bathing and  dressing tasks.  He reports falling when attempting to go to the bathroom by himself with the RW.  Increased difficulty noted when attempting to donn his gripper socks.  Min assist for sitting balance while trying to reach down to the floor to complete the task.     Vision Baseline Vision/History: 1 Wears glasses (for reading) Ability to See in Adequate Light: 0 Adequate Patient Visual Report: No change from baseline Vision Assessment?: No apparent visual deficits     Perception Perception: Within Functional Limits       Praxis Praxis: WFL       Pertinent Vitals/Pain Pain Assessment Pain Assessment: Faces Pain Location: left arm Pain Descriptors / Indicators: Discomfort, Grimacing, Guarding Pain Intervention(s): Limited activity within patient's tolerance, Repositioned     Extremity/Trunk Assessment Upper Extremity Assessment Upper Extremity Assessment: LUE deficits/detail LUE Deficits / Details: Isolated movement at elbow and shoulder but not fluent.  Slight pain with elbow flexion but able to exhibit 0-130 degrees.  Gross digit flexion present but limited extension to approximately 70% of normal in digits 4 and 5.  Slight synergy pattern noted with shoulder flexion to around 100 degrees.  Able to use the LUE at an active assist level for dressing tasks. LUE Sensation: WNL (per gross assessment for light touch) LUE Coordination: decreased fine motor;decreased gross motor   Lower Extremity Assessment Lower Extremity Assessment: Defer to PT evaluation LLE: Unable to fully assess due to pain LLE Coordination: decreased gross motor   Cervical / Trunk Assessment Cervical / Trunk Assessment: Kyphotic   Communication Communication Communication: Impaired Factors Affecting Communication: Reduced clarity of speech   Cognition Arousal: Alert Behavior During Therapy: Flat affect Cognition: No family/caregiver present to determine baseline                                Following commands: Intact       Cueing  General Comments   Cueing Techniques: Verbal cues              Home Living Family/patient expects to be discharged to:: Private residence Living Arrangements: Alone Available Help at Discharge: Available PRN/intermittently (ex-wife lives a few miles away and can assist) Type of Home: Other(Comment) (camper) Home Access: Stairs to enter Entrance Stairs-Number of Steps: 3 Entrance Stairs-Rails: Right;Left;Can reach both Home Layout: One level     Bathroom Shower/Tub: Walk-in shower (walkin shower is at his wife's)   Firefighter: Standard     Home Equipment: Rollator (4 wheels);Shower seat;Grab bars - toilet;Grab bars - tub/shower   Additional Comments: pt reprots using rollator for mobility prior to admission      Prior Functioning/Environment Prior Level of Function : Independent/Modified Independent                    OT Problem List: Decreased strength;Decreased knowledge of use of DME or AE;Impaired UE functional use;Decreased coordination;Impaired balance (sitting and/or standing);Decreased safety awareness   OT Treatment/Interventions: Self-care/ADL training;Patient/family education;Balance training;Neuromuscular education;Therapeutic activities;DME and/or AE instruction;Therapeutic exercise      OT Goals(Current goals can be found in the care plan section)   Acute Rehab OT Goals Patient Stated Goal: Pt wants to get his balance better. OT Goal Formulation: With patient Time For Goal Achievement: 01/01/24 Potential to Achieve Goals: Good   OT Frequency:  Min 1X/week       AM-PAC OT "6 Clicks" Daily Activity     Outcome Measure Help from another person eating meals?: None Help from another person taking care of personal grooming?: A Little Help from another person toileting, which includes using toliet, bedpan, or urinal?: A Little Help from another person bathing (including washing, rinsing,  drying)?: A Little Help from another person to put on and taking off regular upper body clothing?: A Little Help from another person to put on and taking off regular lower body clothing?: A Lot 6 Click Score: 18   End of Session Equipment Utilized During Treatment: Gait belt;Rolling walker (2 wheels) Nurse Communication: Mobility status  Activity Tolerance: Patient tolerated treatment well Patient left: in bed  OT Visit Diagnosis: Unsteadiness on feet (R26.81);Other abnormalities of gait and mobility (R26.89);Repeated falls (R29.6);Muscle weakness (generalized) (M62.81);Hemiplegia and hemiparesis Hemiplegia - Right/Left: Left Hemiplegia - dominant/non-dominant: Dominant Hemiplegia - caused by: Cerebral infarction                Time: 1610-9604 OT Time Calculation (min): 28 min Charges:  OT General Charges $OT Visit: 1 Visit OT Evaluation $OT Eval Moderate Complexity: 1 Mod OT Treatments $Self Care/Home Management : 8-22 mins  Perrin Maltese, OTR/L Acute Rehabilitation Services  Office [phone] 12/18/2023'

## 2023-12-18 NOTE — Evaluation (Addendum)
Physical Therapy Evaluation Patient Details Name: GRANTLAND WANT MRN: [id number] DOB: 1961-09-09 Today's Date: 12/18/2023  History of Present Illness  Pt is a 63 y/o M presenting to ED with weakness and slurred speech after sustaining fall at home. MRI revealed acute infarcts and found to have pneumonia. PMH includes CVA with residual left sided weakness, essential hypertension and hyperlipidemia.  Clinical Impression  Received pt semi-reclined in bed asleep but woke to verbal stimuli. Pt required CGA to transition to sitting EOB with cues for sequencing and hand placement on bedrail - supervision for sit<>supine. Pt reported urge to void, but very resistive to therapist being present in room for safety. Stood from bed with RW and min A and attempted to ambulate into bathroom, however pt unable to extend L knee due to pain and flexing forward with inability to stand upright - deferred ambulating to bathroom for safety. Pt refused to transfer onto bedside commode and insisting on returning to supine to void in urinal - did so with set up assist. Pt then declining any further OOB mobility. Pt reports ambulating with rollator at baseline and lives alone in camper with 3 STE and 2 rails. Ex-wife lives nearby in home with ramp to enter - pt reports he could stay with her. Pt would benefit from continued PT services >3hrs/day to address current impairments. Acute PT to cont to follow.       If plan is discharge home, recommend the following: A little help with walking and/or transfers;A little help with bathing/dressing/bathroom;Assistance with cooking/housework;Direct supervision/assist for financial management;Supervision due to cognitive status;Assist for transportation;Direct supervision/assist for medications management;Help with stairs or ramp for entrance   Can travel by private vehicle        Equipment Recommendations Other (comment) (TBD in next venue)  Recommendations for Other Services        Functional Status Assessment Patient has had a recent decline in their functional status and demonstrates the ability to make significant improvements in function in a reasonable and predictable amount of time.     Precautions / Restrictions Precautions Precautions: Fall Precaution/Restrictions Comments: L hemiparesis Restrictions Weight Bearing Restrictions Per Provider Order: No Other Position/Activity Restrictions: LUE sling for comfort per orders      Mobility  Bed Mobility Overal bed mobility: Needs Assistance Bed Mobility: Rolling, Supine to Sit, Sit to Supine Rolling: Contact guard assist   Supine to sit: Contact guard, HOB elevated, Used rails Sit to supine: Supervision   General bed mobility comments: pt required cues for sequencing and hand placement on bedrail to transition to EOB; pt initally reaching out for therapist to pull him up. Patient Response: Restless, Impulsive, Flat affect  Transfers Overall transfer level: Needs assistance Equipment used: Rolling walker (2 wheels) Transfers: Sit to/from Stand Sit to Stand: Min assist           General transfer comment: pt impulsive and standing from EOB with RW; required light min A for safety. Pt unable to fully extend L knee in stance    Ambulation/Gait Ambulation/Gait assistance: Min assist Gait Distance (Feet): 3 Feet (58ft forward and 63ft backwards) Assistive device: Rolling walker (2 wheels) Gait Pattern/deviations: Step-to pattern, Decreased step length - left, Decreased stance time - left, Decreased stride length Gait velocity: decreased Gait velocity interpretation: <1.31 ft/sec, indicative of household ambulator   General Gait Details: attempted to ambulate towards bathroom, however pt unable to maintain trunk or L kee extension, with flexed trunk and downward gaze. Due to safety concerns, requested  pt tranfer to bedside commode vs ambulating to bathroom but pt frustrated (due to urge to urinate) and  requested to lie back down and void in urinal, requesting provacy from therapist.  Stairs            Wheelchair Mobility     Tilt Bed Tilt Bed Patient Response: Restless, Impulsive, Flat affect  Modified Rankin (Stroke Patients Only)       Balance Overall balance assessment: Needs assistance Sitting-balance support: Feet supported, Bilateral upper extremity supported Sitting balance-Leahy Scale: Fair Sitting balance - Comments: able to maintain static sitting balance with supervision.   Standing balance support: Bilateral upper extremity supported, During functional activity (RW) Standing balance-Leahy Scale: Poor Standing balance comment: required CGA for static standing balance and min A for dynamic standing balance                             Pertinent Vitals/Pain Pain Assessment Pain Assessment: 0-10 Pain Score: 8  Pain Location: LLE Pain Descriptors / Indicators: Discomfort, Grimacing, Guarding Pain Intervention(s): Limited activity within patient's tolerance, Monitored during session, Premedicated before session, Repositioned    Home Living Family/patient expects to be discharged to:: Private residence Living Arrangements: Alone Available Help at Discharge: Available PRN/intermittently (ex-wife lives a few miles away and can assist) Type of Home: Other(Comment) (camper) Home Access: Stairs to enter Entrance Stairs-Rails: Right;Left;Can reach both Entrance Stairs-Number of Steps: 3   Home Layout: One level Home Equipment: Rollator (4 wheels);Shower seat;Grab bars - toilet;Grab bars - tub/shower Additional Comments: pt reprots using rollator for mobility prior to admission    Prior Function Prior Level of Function : Independent/Modified Independent                     Extremity/Trunk Assessment   Upper Extremity Assessment Upper Extremity Assessment: Defer to OT evaluation    Lower Extremity Assessment Lower Extremity Assessment:  Generalized weakness;LLE deficits/detail LLE: Unable to fully assess due to pain LLE Coordination: decreased gross motor    Cervical / Trunk Assessment Cervical / Trunk Assessment: Kyphotic  Communication   Communication Communication: Impaired Factors Affecting Communication: Reduced clarity of speech    Cognition Arousal: Alert Behavior During Therapy: Restless                           PT - Cognition Comments: decreased insight into deficits Following commands: Intact       Cueing Cueing Techniques: Verbal cues, Tactile cues     General Comments General comments (skin integrity, edema, etc.): Pt restless, impulsive, and frustrated that therapist would not allow him to stand and urinate without assist.    Exercises     Assessment/Plan    PT Assessment Patient needs continued PT services  PT Problem List Decreased strength;Decreased coordination;Cardiopulmonary status limiting activity;Decreased cognition;Decreased knowledge of use of DME;Decreased safety awareness;Decreased mobility;Decreased balance;Decreased activity tolerance;Decreased range of motion;Pain       PT Treatment Interventions DME instruction;Balance training;Gait training;Neuromuscular re-education;Stair training;Cognitive remediation;Functional mobility training;Patient/family education;Therapeutic activities;Wheelchair mobility training;Therapeutic exercise;Manual techniques    PT Goals (Current goals can be found in the Care Plan section)  Acute Rehab PT Goals Patient Stated Goal: to go home PT Goal Formulation: With patient Time For Goal Achievement: 01/01/24 Potential to Achieve Goals: Good    Frequency Min 1X/week     Co-evaluation  AM-PAC PT "6 Clicks" Mobility  Outcome Measure Help needed turning from your back to your side while in a flat bed without using bedrails?: A Little Help needed moving from lying on your back to sitting on the side of a flat bed  without using bedrails?: A Little Help needed moving to and from a bed to a chair (including a wheelchair)?: A Little Help needed standing up from a chair using your arms (e.g., wheelchair or bedside chair)?: A Little Help needed to walk in hospital room?: A Lot Help needed climbing 3-5 steps with a railing? : A Lot 6 Click Score: 16    End of Session Equipment Utilized During Treatment: Gait belt   Patient left: in bed;with call bell/phone within reach;with bed alarm set Nurse Communication: Mobility status PT Visit Diagnosis: Unsteadiness on feet (R26.81);Muscle weakness (generalized) (M62.81);Hemiplegia and hemiparesis;Other abnormalities of gait and mobility (R26.89) Hemiplegia - Right/Left: Left Hemiplegia - dominant/non-dominant: Non-dominant Hemiplegia - caused by: Cerebral infarction    Time: 9604-5409 PT Time Calculation (min) (ACUTE ONLY): 23 min   Charges:   PT Evaluation $PT Eval Moderate Complexity: 1 Mod PT Treatments $Therapeutic Activity: 8-22 mins PT General Charges $$ ACUTE PT VISIT: 1 Visit         Blima Rich PT, DPT [facility] 12/18/2023, 9:47 AM

## 2023-12-18 NOTE — Discharge Instructions (Signed)
Dear Hector Booker,   Congratulations for your interest in quitting smoking!  Find a program that suits you best: when you want to quit, how you need support, where you live, and how you like to learn.    If you're ready to get started TODAY, consider scheduling a visit through [email or web address].  Appointments are available from 8am to 8pm, Monday to Friday.   Most health insurance plans will cover some level of tobacco cessation visits and medications.    Additional Resources: [employer] are also available to help you quit & provide the support you'll need. Many programs are available in both [place] and Spanish and have a long history of successfully helping people get off and stay off tobacco.    Quit Smoking Apps:  quitSTART at [employer].de QuitGuide?at [employer].dk Online education and resources: Smokefree  at [employer].gov Free Telephone Coaching: QuitNow,  Call 1-800-QUIT-NOW ((610) 411-4385) or Text- Ready to 202-176-3905 *Quitline [place] has teamed up with Medicaid to offer a free 14 week program    Vaping- Want to Quit? Free 24/7 support. Call [facility]  [place], [place], [place], [place], [place]  [facility]

## 2023-12-18 NOTE — Consult Note (Signed)
NEUROLOGY CONSULT NOTE   Date of service: December 18, 2023 Patient Name: Hector Booker MRN:  [id number] DOB:  [birth year] Chief Complaint: "Slurred Speech" Requesting Provider: Tereasa Coop, MD  History of Present Illness  Hector Booker is a 63 y.o. male with hx of stroke with left hemiparesis with several days of weakness and slurred speech.  He states that normally he is able to get around with walker, but has not been able to do so since at least Sunday.  On Monday he called EMS, but decided to give it a couple more days to see if he got better therefore did not be transported.  Because he was talking problems, he decided to seek care in emergency department.  In the ER, he was found to have concern for pneumonia, started on Levaquin.  An MRI was obtained after admission which demonstrates several acute infarcts.  LKW: Sunday Modified rankin score: 3-Moderate disability-requires help but walks WITHOUT assistance IV Thrombolysis: Out of window EVT: Out of window   NIHSS components Score: Comment  1a Level of Conscious 0[x] 1[] 2[] 3[]     1b LOC Questions 0[x] 1[] 2[]      1c LOC Commands 0[x] 1[] 2[]      2 Best Gaze 0[x] 1[] 2[]      3 Visual 0[x] 1[] 2[] 3[]     4 Facial Palsy 0[x] 1[] 2[] 3[]     5a Motor Arm - left 0[] 1[x] 2[] 3[] 4[] UN[]   5b Motor Arm - Right 0[x] 1[] 2[] 3[] 4[] UN[]   6a Motor Leg - Left 0[] 1[x] 2[] 3[] 4[] UN[]   6b Motor Leg - Right 0[x] 1[] 2[] 3[] 4[] UN[]   7 Limb Ataxia 0[x] 1[] 2[] 3[] UN[]    8 Sensory 0[x] 1[] 2[] UN[]     9 Best Language 0[x] 1[] 2[] 3[]     10 Dysarthria 0[] 1[x] 2[] UN[]     11  Extinct. and Inattention 0[x]  1[]  2[]       TOTAL: 3      Past History   Past Medical History:  Diagnosis Date   Alcohol abuse 12/04/2016   Arthritis    Cocaine abuse (HCC) 12/02/2016   Hyperlipemia    Stroke [facility])     Past Surgical History:  Procedure Laterality Date   IR GENERIC HISTORICAL  12/02/2016   IR ANGIO VERTEBRAL SEL  VERTEBRAL BILAT MOD SED 12/02/2016 Julieanne Cotton, MD [facility]   IR GENERIC HISTORICAL  12/02/2016   IR ANGIO INTRA EXTRACRAN SEL COM CAROTID INNOMINATE BILAT MOD SED 12/02/2016 Julieanne Cotton, MD [facility]   LOWER EXTREMITY ANGIOGRAPHY N/A 07/29/2019   Procedure: LOWER EXTREMITY ANGIOGRAPHY;  Surgeon: Chuck Hint, MD;  Location: [facility] INVASIVE CV LAB;  Service: Cardiovascular;  Laterality: N/A;    Family History: Family History  Problem Relation Age of Onset   Heart attack Father    Stroke Paternal Grandfather     Social History  reports that he has been smoking cigars and cigarettes. He has never used smokeless tobacco. He reports current alcohol use of about 1.0 standard drink of alcohol per week. He reports that he does not currently use drugs after having used the following drugs: Codeine and Cocaine.  Allergies  Allergen Reactions   Penicillins Anaphylaxis    Has patient had a PCN reaction causing immediate rash, facial/tongue/throat swelling, SOB or lightheadedness with hypotension: Yes Has patient had a PCN reaction causing severe  rash involving mucus membranes or skin necrosis: No Has patient had a PCN reaction that required hospitalization Yes Has patient had a PCN reaction occurring within the last 10 years: No If all of the above answers are "NO", then may proceed with Cephalosporin use.     Medications   Current Facility-Administered Medications:    0.9 %  sodium chloride infusion, 250 mL, Intravenous, PRN, Janalyn Shy, Subrina, MD   acetaminophen (TYLENOL) tablet 650 mg, 650 mg, Oral, Q6H PRN **OR** acetaminophen (TYLENOL) suppository 650 mg, 650 mg, Rectal, Q6H PRN, Janalyn Shy, Subrina, MD   aspirin EC tablet 81 mg, 81 mg, Oral, Daily, Sundil, Subrina, MD, 81 mg at 12/17/23 2325   atorvastatin (LIPITOR) tablet 80 mg, 80 mg, Oral, q1800, Sundil, Subrina, MD   cefTRIAXone (ROCEPHIN) 2 g in sodium chloride 0.9 % 100 mL IVPB, 2 g, Intravenous, Q24H, Sundil,  Subrina, MD, Last Rate: 200 mL/hr at 12/17/23 2205, 2 g at 12/17/23 2205   clopidogrel (PLAVIX) tablet 75 mg, 75 mg, Oral, Daily, Sundil, Subrina, MD   diclofenac Sodium (VOLTAREN) 1 % topical gel 2-4 g, 2-4 g, Topical, QID PRN, Sundil, Subrina, MD   famotidine (PEPCID) tablet 20 mg, 20 mg, Oral, BID, Sundil, Subrina, MD, 20 mg at 12/17/23 2058   HYDROcodone-acetaminophen (NORCO) 7.5-325 MG per tablet 1 tablet, 1 tablet, Oral, Q6H PRN, Janalyn Shy, Subrina, MD, 1 tablet at 12/17/23 2058   levofloxacin (LEVAQUIN) IVPB 750 mg, 750 mg, Intravenous, Once, Sundil, Subrina, MD, Last Rate: 100 mL/hr at 12/17/23 2326, 750 mg at 12/17/23 2326   lisinopril (ZESTRIL) tablet 20 mg, 20 mg, Oral, Daily, Sundil, Subrina, MD   metroNIDAZOLE (FLAGYL) IVPB 500 mg, 500 mg, Intravenous, Q12H, Sundil, Subrina, MD, Last Rate: 100 mL/hr at 12/17/23 2105, 500 mg at 12/17/23 2105   ondansetron (ZOFRAN) tablet 4 mg, 4 mg, Oral, Q6H PRN, 4 mg at 12/17/23 2059 **OR** ondansetron (ZOFRAN) injection 4 mg, 4 mg, Intravenous, Q6H PRN, Sundil, Subrina, MD   sodium chloride flush (NS) 0.9 % injection 3 mL, 3 mL, Intravenous, Q12H, Sundil, Subrina, MD   sodium chloride flush (NS) 0.9 % injection 3 mL, 3 mL, Intravenous, Q12H, Sundil, Subrina, MD   sodium chloride flush (NS) 0.9 % injection 3 mL, 3 mL, Intravenous, PRN, Janalyn Shy, Subrina, MD  Vitals   Vitals:   12/17/23 1351 12/17/23 1357 12/17/23 2038  BP:  125/78 (!) 105/94  Pulse:  68 72  Resp:  18 18  Temp:  98.1 F (36.7 C) 98.1 F (36.7 C)  TempSrc:  Oral Oral  SpO2: 96% 100% 99%  Weight:   72.1 kg  Height:   5\' 10"  (1.778 m)    Body mass index is 22.81 kg/m.  Physical Exam   Constitutional: Appears well-developed and well-nourished.   Neurologic Examination    Neuro: Mental Status: Patient is awake, alert, oriented to person, place, month, year, and situation. Patient is able to give a clear and coherent history. No signs of aphasia or neglect Cranial  Nerves: II: Visual Fields are full. Pupils are equal, round, and reactive to light.   III,IV, VI: EOMI without ptosis or diploplia.  V: Facial sensation is symmetric to temperature VII: Facial movement is symmetric.  VIII: hearing is intact to voice X: Uvula elevates symmetrically XII: tongue is midline without atrophy or fasciculations.  Motor: Tone is normal. Bulk is normal.  He has weakness versus limitations due to pain in the left arm and leg.  I also think he has mild right leg  weakness as well but he does not drift. Sensory: Sensation is symmetric to light touch and temperature in the arms and legs. Cerebellar: He has been very slow but without ataxia of the right arm, unable to do with the left arm due to pain       Labs/Imaging/Neurodiagnostic studies   CBC:  Recent Labs  Lab 2024-01-13 1741  WBC 14.3*  NEUTROABS 10.8*  HGB 14.1  HCT 41.1  MCV 87.1  PLT 248   Basic Metabolic Panel:  Lab Results  Component Value Date   NA 138 01-13-24   K 3.0 (L) January 13, 2024   CO2 30 01/13/2024   GLUCOSE 81 01-13-24   BUN 8 2024/01/13   CREATININE 0.65 01/13/24   CALCIUM 8.9 Jan 13, 2024   GFRNONAA >60 13-Jan-2024   GFRAA >60 03/20/2017   Lipid Panel:  Lab Results  Component Value Date   LDLCALC 83 11/01/2021   HgbA1c:  Lab Results  Component Value Date   HGBA1C 5.4 11/01/2021   Urine Drug Screen:     Component Value Date/Time   LABOPIA POSITIVE (A) 11/01/2021 1547   COCAINSCRNUR NONE DETECTED 11/01/2021 1547   LABBENZ NONE DETECTED 11/01/2021 1547   AMPHETMU NONE DETECTED 11/01/2021 1547   THCU NONE DETECTED 11/01/2021 1547   LABBARB NONE DETECTED 11/01/2021 1547    Alcohol Level     Component Value Date/Time   ETH <10 2024/01/13 1741   INR  Lab Results  Component Value Date   INR 1.2 2024-01-13   APTT  Lab Results  Component Value Date   APTT 37 (H) 01-13-2024   AED levels: No results found for: "PHENYTOIN", "ZONISAMIDE", "LAMOTRIGINE",  "LEVETIRACETA"  MRI brain-small pontine and left frontal strokes  ASSESSMENT   DAMONIE ELLENWOOD is a 63 y.o. male with acute infarcts in the pons as well as right frontal region.  With multiple distributions, with interval resolution is to be considered.  He is on Plavix monotherapy as an outpatient and we will do dual antiplatelet for the time being but he will also need further workup.  RECOMMENDATIONS  - HgbA1c, fasting lipid panel - Frequent neuro checks - Echocardiogram - CTA head and neck - Prophylactic therapy-Antiplatelet med: Aspirin - dose 81mg  and plavix 75mg  daily for 3 weeks - Risk factor modification - Telemetry monitoring - PT consult, OT consult, Speech consult - Stroke team to follow  ______________________________________________________________________    Signed, Ritta Slot, MD [facility]

## 2023-12-18 NOTE — Progress Notes (Signed)
PROGRESS NOTE  Hector Booker UEA:[id number] DOB: Nov 06, 1960 DOA: 12/17/2023 PCP: [name], MD   LOS: 1 day   Brief narrative:  Hector Booker is a 63 y.o. male with medical history significant of CVA with residual left sided weakness, essential hypertension and hyperlipidemia presented to the hospital with weakness and slurred speech since Tuesday with a fall and dysphagia.  Patient had lost his balance as well.  In the ED, patient was hemodynamically stable.  Labs showed potassium low at 3.0 with albumin low at 2.9.  CBC showed mild leukocytosis at 14.3.  Chest x-ray showed patchy right lower lobe normal in specific infiltrate represent pneumonia.  Pulmonary contusion also could be appear similar.  X-ray of the left knee pelvis without any fracture but left elbow showing irregularity of the radial head may represent nondisplaced fracture.  CT head no hemorrhage and acute intracranial infraction.  Chronic posterior right MCA territory and chronic right pontine infarction.  In the ED, patient received morphine and 1 L of Ringer lactate bolus.  MRI of the brain was ordered. Patient was then considered for admission to hospital for further evaluation and treatment for aspiration pneumonia, generalized weakness, nondisplaced fracture of the left radial head, mechanical fall and dysphagia.     Assessment/Plan: Principal Problem:   Aspiration pneumonia (HCC) Active Problems:   Ischemic stroke (HCC)   History of CVA with residual deficit of the left side   Fall at home, initial encounter   Dysphagia   Nondisplaced fracture of left radial head fracture   Hyperlipidemia   Essential hypertension   Knee osteoarthritis   Dysarthria   Hypokalemia   Aspiration pneumonia Had some dysarthria and dysphagia with previous CVA with residual left-sided weakness.  X-ray showed pneumonia.  Mild leukocytosis.  Has anaphylaxis with penicillin so has been started on Rocephin and metronidazole.  Check speech  and swallow evaluation.   Mechanical fall at home Imaging showed left-sided radial and nondisplaced fracture.  CT head unremarkable.  Check PT OT.      Acute ischemic CVA-likely embolic source History of CVA with residual left-sided deficit Dysarthria and dysphagia-resolved MRI of the brain showed acute infarct in the left pons and high left frontal white matter.  Continue strict protocol.  Neurochecks.  Continue Lipitor.  Check PT OT.  Neurology has been consulted.  Has been started on aspirin, Plavix statin.  Out of window for thrombectomy.  Check CTA head and neck.  Pending hemoglobin A1c and lipid panel. Check 2D echocardiogram. Continue cardiac monitoring.  Aspiration and fall precautions.  Check speech therapy consultation.   Left radial head nondisplaced fracture X-ray of the elbow showed irregularity of the radial head represent nondisplaced fracture.  Recommended follow-up exam in 7 to 10 days.  Consulted [facility]  and per discussion given it is a nondisplaced fracture can continue the sling for the patient's comfort however there is no need for further x-ray or outpatient follow-up.  Continue pain management.  Hypokalemia Potassium was 3.0 on presentation.  Has been replenished.  Check BMP in AM.   Essential hypertension Continue lisinopril for now   Hyperlipidemia Continue Lipitor   Chronic knee osteoarthritis No fractures.  Continue pain management Voltaren.  PT OT evaluation.  DVT prophylaxis: enoxaparin (LOVENOX) injection 40 mg Start: 12/18/23 1000 SCDs Start: 12/17/23 1932 Place TED hose Start: 12/17/23 1932   Disposition: Pertinent at this time.  Might need rehabilitation.  Status is: Inpatient Remains inpatient appropriate because: Aspiration pneumonia, ischemic stroke, possible need  for rehab.    Code Status:     Code Status: Full Code  Family Communication: None at bedside  Consultants: Neurology  Procedures: None  Anti-infectives:  Rocephin and  metronidazole  Anti-infectives (From admission, onward)    Start     Dose/Rate Route Frequency Ordered Stop   12/17/23 2100  cefTRIAXone (ROCEPHIN) 2 g in sodium chloride 0.9 % 100 mL IVPB        2 g 200 mL/hr over 30 Minutes Intravenous Every 24 hours 12/17/23 2005 12/24/23 2059   12/17/23 2000  metroNIDAZOLE (FLAGYL) IVPB 500 mg        500 mg 100 mL/hr over 60 Minutes Intravenous Every 12 hours 12/17/23 1940     12/17/23 1945  levofloxacin (LEVAQUIN) IVPB 750 mg  Status:  Discontinued        750 mg 100 mL/hr over 90 Minutes Intravenous Every 48 hours 12/17/23 1940 12/17/23 1941   12/17/23 1945  levofloxacin (LEVAQUIN) IVPB 750 mg  Status:  Discontinued        750 mg 100 mL/hr over 90 Minutes Intravenous Every 24 hours 12/17/23 1941 12/17/23 2005   12/17/23 1915  levofloxacin (LEVAQUIN) IVPB 750 mg        750 mg 100 mL/hr over 90 Minutes Intravenous  Once 12/17/23 1909 12/18/23 0056      Subjective: Today, patient was seen and examined at bedside.  Complains of mild cough and shortness of breath.  Denies any chest pain, dizziness, nausea or vomiting.  Nursing staff reported some trouble with liquids.  Complains of slurred speech.  Objective: Vitals:   12/18/23 0501 12/18/23 1045  BP: (!) 159/77 (!) 146/79  Pulse: 76 88  Resp: 18 18  Temp: 97.7 F (36.5 C) 98 F (36.7 C)  SpO2: 96% 97%   No intake or output data in the 24 hours ending 12/18/23 1144 Filed Weights   12/17/23 2038  Weight: 72.1 kg   Body mass index is 22.81 kg/m.   Physical Exam:  GENERAL: Patient is alert awake and oriented. Not in obvious distress. HENT: No scleral pallor or icterus. Pupils equally reactive to light. Oral mucosa is moist NECK: is supple, no gross swelling noted. CHEST: Clear to auscultation. No crackles or wheezes.  Diminished breath sounds bilaterally. CVS: S1 and S2 heard, no murmur. Regular rate and rhythm.  ABDOMEN: Soft, non-tender, bowel sounds are present. EXTREMITIES: No  edema. CNS: Cranial nerves are intact.  Left residual hemiparesis.  Slurred speech. SKIN: warm and dry without rashes.  Data Review: I have personally reviewed the following laboratory data and studies,  CBC: Recent Labs  Lab 12/17/23 1741  WBC 14.3*  NEUTROABS 10.8*  HGB 14.1  HCT 41.1  MCV 87.1  PLT 248   Basic Metabolic Panel: Recent Labs  Lab 12/17/23 1741  NA 138  K 3.0*  CL 96*  CO2 30  GLUCOSE 81  BUN 8  CREATININE 0.65  CALCIUM 8.9   Liver Function Tests: Recent Labs  Lab 12/17/23 1741  AST 20  ALT 12  ALKPHOS 84  BILITOT 1.3*  PROT 6.5  ALBUMIN 2.9*   No results for input(s): "LIPASE", "AMYLASE" in the last 168 hours. No results for input(s): "AMMONIA" in the last 168 hours. Cardiac Enzymes: No results for input(s): "CKTOTAL", "CKMB", "CKMBINDEX", "TROPONINI" in the last 168 hours. BNP (last 3 results) No results for input(s): "BNP" in the last 8760 hours.  ProBNP (last 3 results) No results for input(s): "PROBNP" in the last  8760 hours.  CBG: No results for input(s): "GLUCAP" in the last 168 hours. Recent Results (from the past 240 hours)  Resp panel by RT-PCR (RSV, Flu A&B, Covid) Anterior Nasal Swab     Status: None   Collection Time: 12/17/23  4:04 PM   Specimen: Anterior Nasal Swab  Result Value Ref Range Status   SARS Coronavirus 2 by RT PCR NEGATIVE NEGATIVE Final   Influenza A by PCR NEGATIVE NEGATIVE Final   Influenza B by PCR NEGATIVE NEGATIVE Final    Comment: (NOTE) The Xpert Xpress SARS-CoV-2/FLU/RSV plus assay is intended as an aid in the diagnosis of influenza from Nasopharyngeal swab specimens and should not be used as a sole basis for treatment. Nasal washings and aspirates are unacceptable for Xpert Xpress SARS-CoV-2/FLU/RSV testing.  Fact Sheet for Patients: BloggerCourse.com  Fact Sheet for Healthcare Providers: SeriousBroker.it  This test is not yet approved or  cleared by the [place] FDA and has been authorized for detection and/or diagnosis of SARS-CoV-2 by FDA under an Emergency Use Authorization (EUA). This EUA will remain in effect (meaning this test can be used) for the duration of the COVID-19 declaration under Section 564(b)(1) of the Act, 21 U.S.C. section 360bbb-3(b)(1), unless the authorization is terminated or revoked.     Resp Syncytial Virus by PCR NEGATIVE NEGATIVE Final    Comment: (NOTE) Fact Sheet for Patients: BloggerCourse.com  Fact Sheet for Healthcare Providers: SeriousBroker.it  This test is not yet approved or cleared by the [place] FDA and has been authorized for detection and/or diagnosis of SARS-CoV-2 by FDA under an Emergency Use Authorization (EUA). This EUA will remain in effect (meaning this test can be used) for the duration of the COVID-19 declaration under Section 564(b)(1) of the Act, 21 U.S.C. section 360bbb-3(b)(1), unless the authorization is terminated or revoked.  Performed at [facility], [address], [place], [place] [address]      Studies: MR BRAIN WO CONTRAST Result Date: 12/17/2023 CLINICAL DATA:  Neuro deficit, acute, stroke suspected EXAM: MRI HEAD WITHOUT CONTRAST TECHNIQUE: Multiplanar, multiecho pulse sequences of the brain and surrounding structures were obtained without intravenous contrast. COMPARISON:  MRI 11/01/2021. FINDINGS: Brain: Acute left pontine infarct. Small acute infarcts in the high left frontal white matter. Mild edema without mass effect. Additional scattered T2/FLAIR hyperintensity in the white matter are compatible with chronic microvascular ischemic disease. Remote right posterior MCA territory infarct. Remote right pontine infarct. No evidence of acute hemorrhage, mass lesion, midline shift or hydrocephalus. Evidence of prior hemorrhage associated with the prior right MCA territory infarct. Vascular:  Major arterial flow voids are maintained at the skull base. Skull and upper cervical spine: Normal marrow signal. Sinuses/Orbits: Clear sinuses.  No acute orbital findings. Other: No mastoid effusions. IMPRESSION: 1. Acute infarcts in the left pons and high left frontal white matter. 2. Remote right posterior MCA territory and right pontine infarcts. Electronically Signed   By: Feliberto Harts M.D.   On: 12/17/2023 21:52   CT HEAD WO CONTRAST ( ) Result Date: 12/17/2023 CLINICAL DATA:  Neuro deficit, acute, stroke suspected EXAM: CT HEAD WITHOUT CONTRAST TECHNIQUE: Contiguous axial images were obtained from the base of the skull through the vertex without intravenous contrast. RADIATION DOSE REDUCTION: This exam was performed according to the departmental dose-optimization program which includes automated exposure control, adjustment of the mA and/or kV according to patient size and/or use of iterative reconstruction technique. COMPARISON:  Head CT 11/07/2021 FINDINGS: Brain: No hemorrhage. No hydrocephalus. No  extra-axial fluid collection. No mass effect. No mass lesion. There is chronic posterior right MCA territory infarct and a chronic right pontine infarct. No hemorrhage. No hydrocephalus. No extra-axial fluid collection. No mass effect. No mass lesion. Vascular: No hyperdense vessel or unexpected calcification. Skull: Normal. Negative for fracture or focal lesion. Sinuses/Orbits: N mild mucosal thickening in the right maxillary sinus. Orbits are unremarkable. O middle ear or mastoid effusion Other: None. IMPRESSION: 1. No hemorrhage or CT evidence of an acute cortical infarct. 2. Chronic posterior right MCA territory infarct and chronic right pontine infarct. Electronically Signed   By: Lorenza Cambridge M.D.   On: 12/17/2023 17:44   DG Pelvis 1-2 Views Result Date: 12/17/2023 CLINICAL DATA:  Fall, weakness. EXAM: PELVIS - 1-2 VIEW COMPARISON:  None Available. FINDINGS: The cortical margins of the bony  pelvis are intact. No fracture. Pubic symphysis and sacroiliac joints are congruent. Bilateral hip degenerative change. Both femoral heads are well-seated in the respective acetabula. Vascular and seminal vesicle calcifications. IMPRESSION: No pelvic fracture. Electronically Signed   By: Narda Rutherford M.D.   On: 12/17/2023 17:35   DG Elbow Complete Left Result Date: 12/17/2023 CLINICAL DATA:  Fall, weakness. EXAM: LEFT ELBOW - COMPLETE 3+ VIEW COMPARISON:  None Available. FINDINGS: There is mild irregularity of the radial head that may represent a nondisplaced fracture. No additional fracture. Normal alignment, no dislocation. There is a prominent anterior fat pad without definite joint effusion. IMPRESSION: Irregularity of the radial head may represent a nondisplaced fracture. Consider follow-up exam in 7-10 days. Electronically Signed   By: Narda Rutherford M.D.   On: 12/17/2023 17:34   DG Knee 2 Views Left Result Date: 12/17/2023 CLINICAL DATA:  Fall, weakness. EXAM: LEFT KNEE - 1-2 VIEW COMPARISON:  None Available. FINDINGS: No acute fracture or dislocation. Lateral tibiofemoral joint space narrowing. Moderate tricompartmental peripheral spurring. 15 mm ossified body in Hoffa's fat pad. Minimal knee joint effusion. No erosions or focal bone abnormality. IMPRESSION: 1. No acute fracture or dislocation. 2. Moderate tricompartmental osteoarthritis.  Small joint effusion. 3. Ossified body within Hoffa's fat pad. Electronically Signed   By: Narda Rutherford M.D.   On: 12/17/2023 17:32   DG Chest 2 View Result Date: 12/17/2023 CLINICAL DATA:  Weakness, fall. EXAM: CHEST - 2 VIEW COMPARISON:  Remote radiograph 09/28/2011 FINDINGS: Mild patchy opacity at the right lung base. Normal heart size and mediastinal contours. No pulmonary edema, pleural effusion or pneumothorax. No displaced rib fracture or acute osseous findings. IMPRESSION: Patchy right lower lobe opacity is nonspecific in the setting of fall.  This may represent pneumonia in the appropriate clinical setting. Pulmonary contusion could appear similar. Electronically Signed   By: Narda Rutherford M.D.   On: 12/17/2023 17:31      Joycelyn Das, MD  [facility] 12/18/2023  If 7PM-7AM, please contact night-coverage

## 2023-12-18 NOTE — Progress Notes (Signed)
Called CT to make them aware pt has a 20g in RFA

## 2023-12-18 NOTE — Plan of Care (Signed)

## 2023-12-18 NOTE — Evaluation (Signed)
Clinical/Bedside Swallow Evaluation Patient Details  Name: Hector Booker MRN: [id number] Date of Birth: [birth year]  Today's Date: 12/18/2023 Time: SLP Start Time (ACUTE ONLY): 1420 SLP Stop Time (ACUTE ONLY): 1447 SLP Time Calculation (min) (ACUTE ONLY): 27 min  Past Medical History:  Past Medical History:  Diagnosis Date   Alcohol abuse 12/04/2016   Arthritis    Cocaine abuse (HCC) 12/02/2016   Hyperlipemia    Stroke [facility])    Past Surgical History:  Past Surgical History:  Procedure Laterality Date   IR GENERIC HISTORICAL  12/02/2016   IR ANGIO VERTEBRAL SEL VERTEBRAL BILAT MOD SED 12/02/2016 Julieanne Cotton, MD [facility]-INTER[facility] RAD   IR GENERIC HISTORICAL  12/02/2016   IR ANGIO INTRA EXTRACRAN SEL COM CAROTID INNOMINATE BILAT MOD SED 12/02/2016 Julieanne Cotton, MD [facility]   LOWER EXTREMITY ANGIOGRAPHY N/A 07/29/2019   Procedure: LOWER EXTREMITY ANGIOGRAPHY;  Surgeon: Chuck Hint, MD;  Location: [facility] INVASIVE CV LAB;  Service: Cardiovascular;  Laterality: N/A;   HPI:  Pt is a 63 y/o M presenting to ED with weakness, slurred speech, and trouble swallowing after sustaining fall at home. MRI revealed acute infarcts in the left pons and high left frontal white matter. CXR concerning for RLL PNA. 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    Assessment / Plan / Recommendation  Clinical Impression  Pt has immediate coughing after bite of graham cracker and straw sips of water, noticed  with less frequency once straw was removed. He attributes coughing to "phlegm" that he cannot clear, but it was not observed outside of PO trials and seems to coincide with timing of swallows. Given acute strokes and concern for RLL PNA, there is concern for dysphagia with reduced airway protection. Discussed presentation with pt and father and they are in agreement with proceeding with MBS. However, this cannot be completed today. Until  it can be scheduled, pt says he will not consider NPO status or thickened liquids (SLP offered them during eval, but would not try them with SLP). If leaving on current diet ordered by MD, educated them on risks of potential adverse events in the setting of aspiration as well as s/s of aspiration for which to monitor. Would provide frequent, thorough oral care, and take small, single sips via cup. Discussed with RN to monitor in case coughing persists and trays need to be held.   SLP Visit Diagnosis: Dysphagia, unspecified (R13.10)    Aspiration Risk  Moderate aspiration risk    Diet Recommendation  (diet ordered per MD - pt will not consider consider modifications before MBS)    Medication Administration: Crushed with puree    Other  Recommendations Oral Care Recommendations: Oral care QID Caregiver Recommendations: Have oral suction available    Recommendations for follow up therapy are one component of a multi-disciplinary discharge planning process, led by the attending physician.  Recommendations may be updated based on patient status, additional functional criteria and insurance authorization.  Follow up Recommendations Acute inpatient rehab (3hours/day)      Assistance Recommended at Discharge    Functional Status Assessment    Frequency and Duration            Prognosis        Swallow Study   General HPI: Pt is a 63 y/o M presenting to ED with weakness, slurred speech, and trouble swallowing after sustaining fall at home. MRI revealed acute infarcts in the left pons and high  left frontal white matter. CXR concerning for RLL PNA. 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 Type of Study: Bedside Swallow Evaluation Previous Swallow Assessment: none in chart Diet Prior to this Study: Dysphagia 1 (pureed);Thin liquids (Level 0) Temperature Spikes Noted: No Respiratory Status: Room air History of Recent  Intubation: No Behavior/Cognition: Alert;Requires cueing Oral Cavity Assessment: Within Functional Limits Oral Care Completed by SLP: No Oral Cavity - Dentition: Edentulous;Dentures, not available (pt says he doesn't always eat with his dentures) Vision: Functional for self-feeding Self-Feeding Abilities: Able to feed self Patient Positioning: Upright in bed Baseline Vocal Quality: Normal Volitional Cough: Weak Volitional Swallow: Able to elicit    Oral/Motor/Sensory Function Overall Oral Motor/Sensory Function:  (?subtle L facial droop)   Ice Chips Ice chips: Not tested   Thin Liquid Thin Liquid: Impaired Presentation: Cup;Self Fed;Straw Pharyngeal  Phase Impairments: Cough - Immediate    Nectar Thick Nectar Thick Liquid: Not tested   Honey Thick Honey Thick Liquid: Not tested   Puree Puree: Within functional limits Presentation: Self Fed;Spoon   Solid     Solid: Impaired Presentation: Self Fed Pharyngeal Phase Impairments: Cough - Immediate      Mahala Menghini., M.A. CCC-SLP Acute Rehabilitation Services Office [phone]  Secure chat preferred  12/18/2023,2:58 PM

## 2023-12-18 NOTE — Hospital Course (Signed)
Hector Booker is a 63 y.o. male with medical history significant of CVA with residual left sided weakness, essential hypertension and hyperlipidemia presented to the hospital with weakness and slurred speech since Tuesday with a fall and dysphagia.  Patient had lost his balance as well.  In the ED patient was hemodynamically stable.  Labs showed potassium low at 3.0 with albumin low at 2.9.  CBC showed mild leukocytosis at 14.3.  Chest x-ray showed patchy right lower lobe normal in specific infiltrate represent pneumonia.  Pulmonary contusion also could be appear similar.  X-ray of the left knee pelvis without any fracture but left elbow showing irregularity of the radial head may represent nondisplaced fracture.  CT head no hemorrhage and acute intracranial infraction.  Chronic posterior right MCA territory and chronic right pontine infarction.   In the ED, patient received morphine and 1 L of Ringer lactate bolus.  MRI of the brain was ordered. Patient was then considered for admission to hospital for further evaluation and treatment for aspiration pneumonia, generalized weakness, nondisplaced fracture of the left radial head, mechanical fall and dysphagia.  Assessment and Plan:  Aspiration pneumonia Had some dysarthria and dysphagia with previous CVA with residual left-sided weakness.  X-ray showed pneumonia.  Mild leukocytosis.  Has anaphylaxis with penicillin so has been started on Rocephin and metronidazole.   Mechanical fall at home Imaging showed left-sided radial and nondisplaced fracture.  CT head unremarkable.  Check PT OT.      Acute ischemic CVA-likely embolic source History of CVA with residual left-sided deficit Dysarthria and dysphagia-resolved MRI of the brain showed acute infarct in the left pons and high left frontal white matter.  Continue strict protocol.  Neurochecks.  Lipitor.  Check PT OT.  Neurology has been consulted.  Has been started on aspirin, Plavix statin.  Out of  window for thrombectomy.  Check CTA head and neck.  Pending hemoglobin A1c and lipid panel.-Check 2D echocardiogram.- Continue cardiac monitoring.  Sedation precaution.  Speech therapy consultation.   Left radial head nondisplaced fracture X-ray of the elbow showed irregularity of the radial head represent nondisplaced fracture.  Recommended follow-up exam in 7 to 10 days.  Consulted [facility]  and per discussion given it is a nondisplaced fracture can continue the sling for the patient's comfort however there is no need for further x-ray or outpatient follow-up.  Continue pain management.  Hypokalemia Potassium was 3.0 on presentation.  Has been replenished.  Check BMP from today.   Essential hypertension Continue lisinopril for now   Hyperlipidemia Continue Lipitor   Chronic knee osteoarthritis No fractures.  Continue pain management Voltaren.  PT OT

## 2023-12-18 NOTE — Progress Notes (Signed)
Inpatient Rehab Admissions Coordinator Note:   Per therapy recommendation patient was screened for CIR candidacy by Stephania Fragmin, PT. At this time, pt appears to be a potential candidate for CIR. I will place an order for rehab consult for full assessment, per our protocol.  Please contact me any with questions.Estill Dooms, PT, DPT [phone] 12/18/23 1:08 PM

## 2023-12-18 NOTE — Progress Notes (Addendum)
STROKE TEAM PROGRESS NOTE   INTERIM HISTORY/SUBJECTIVE Patient has been hemodynamically stable and afebrile overnight and is being treated appropriately for pneumonia.  He has been working with physical therapy.  MRI shows tiny left corona radiata and pontine lacunar infarcts.  CT angiogram is pending OBJECTIVE  CBC    Component Value Date/Time   WBC 14.3 (H) 12/17/2023 1741   RBC 4.72 12/17/2023 1741   HGB 14.1 12/17/2023 1741   HCT 41.1 12/17/2023 1741   PLT 248 12/17/2023 1741   MCV 87.1 12/17/2023 1741   MCH 29.9 12/17/2023 1741   MCHC 34.3 12/17/2023 1741   RDW 13.9 12/17/2023 1741   LYMPHSABS 2.5 12/17/2023 1741   MONOABS 0.9 12/17/2023 1741   EOSABS 0.1 12/17/2023 1741   BASOSABS 0.1 12/17/2023 1741    BMET    Component Value Date/Time   NA 138 12/17/2023 1741   K 3.0 (L) 12/17/2023 1741   CL 96 (L) 12/17/2023 1741   CO2 30 12/17/2023 1741   GLUCOSE 81 12/17/2023 1741   BUN 8 12/17/2023 1741   CREATININE 0.65 12/17/2023 1741   CALCIUM 8.9 12/17/2023 1741   GFRNONAA >60 12/17/2023 1741    IMAGING past 24 hours MR BRAIN WO CONTRAST Result Date: 12/17/2023 CLINICAL DATA:  Neuro deficit, acute, stroke suspected EXAM: MRI HEAD WITHOUT CONTRAST TECHNIQUE: Multiplanar, multiecho pulse sequences of the brain and surrounding structures were obtained without intravenous contrast. COMPARISON:  MRI 11/01/2021. FINDINGS: Brain: Acute left pontine infarct. Small acute infarcts in the high left frontal white matter. Mild edema without mass effect. Additional scattered T2/FLAIR hyperintensity in the white matter are compatible with chronic microvascular ischemic disease. Remote right posterior MCA territory infarct. Remote right pontine infarct. No evidence of acute hemorrhage, mass lesion, midline shift or hydrocephalus. Evidence of prior hemorrhage associated with the prior right MCA territory infarct. Vascular: Major arterial flow voids are maintained at the skull base. Skull and  upper cervical spine: Normal marrow signal. Sinuses/Orbits: Clear sinuses.  No acute orbital findings. Other: No mastoid effusions. IMPRESSION: 1. Acute infarcts in the left pons and high left frontal white matter. 2. Remote right posterior MCA territory and right pontine infarcts. Electronically Signed   By: Feliberto Harts M.D.   On: 12/17/2023 21:52   CT HEAD WO CONTRAST ( ) Result Date: 12/17/2023 CLINICAL DATA:  Neuro deficit, acute, stroke suspected EXAM: CT HEAD WITHOUT CONTRAST TECHNIQUE: Contiguous axial images were obtained from the base of the skull through the vertex without intravenous contrast. RADIATION DOSE REDUCTION: This exam was performed according to the departmental dose-optimization program which includes automated exposure control, adjustment of the mA and/or kV according to patient size and/or use of iterative reconstruction technique. COMPARISON:  Head CT 11/07/2021 FINDINGS: Brain: No hemorrhage. No hydrocephalus. No extra-axial fluid collection. No mass effect. No mass lesion. There is chronic posterior right MCA territory infarct and a chronic right pontine infarct. No hemorrhage. No hydrocephalus. No extra-axial fluid collection. No mass effect. No mass lesion. Vascular: No hyperdense vessel or unexpected calcification. Skull: Normal. Negative for fracture or focal lesion. Sinuses/Orbits: N mild mucosal thickening in the right maxillary sinus. Orbits are unremarkable. O middle ear or mastoid effusion Other: None. IMPRESSION: 1. No hemorrhage or CT evidence of an acute cortical infarct. 2. Chronic posterior right MCA territory infarct and chronic right pontine infarct. Electronically Signed   By: Lorenza Cambridge M.D.   On: 12/17/2023 17:44   DG Pelvis 1-2 Views Result Date: 12/17/2023 CLINICAL DATA:  Fall, weakness. EXAM:  PELVIS - 1-2 VIEW COMPARISON:  None Available. FINDINGS: The cortical margins of the bony pelvis are intact. No fracture. Pubic symphysis and sacroiliac joints  are congruent. Bilateral hip degenerative change. Both femoral heads are well-seated in the respective acetabula. Vascular and seminal vesicle calcifications. IMPRESSION: No pelvic fracture. Electronically Signed   By: Narda Rutherford M.D.   On: 12/17/2023 17:35   DG Elbow Complete Left Result Date: 12/17/2023 CLINICAL DATA:  Fall, weakness. EXAM: LEFT ELBOW - COMPLETE 3+ VIEW COMPARISON:  None Available. FINDINGS: There is mild irregularity of the radial head that may represent a nondisplaced fracture. No additional fracture. Normal alignment, no dislocation. There is a prominent anterior fat pad without definite joint effusion. IMPRESSION: Irregularity of the radial head may represent a nondisplaced fracture. Consider follow-up exam in 7-10 days. Electronically Signed   By: Narda Rutherford M.D.   On: 12/17/2023 17:34   DG Knee 2 Views Left Result Date: 12/17/2023 CLINICAL DATA:  Fall, weakness. EXAM: LEFT KNEE - 1-2 VIEW COMPARISON:  None Available. FINDINGS: No acute fracture or dislocation. Lateral tibiofemoral joint space narrowing. Moderate tricompartmental peripheral spurring. 15 mm ossified body in Hoffa's fat pad. Minimal knee joint effusion. No erosions or focal bone abnormality. IMPRESSION: 1. No acute fracture or dislocation. 2. Moderate tricompartmental osteoarthritis.  Small joint effusion. 3. Ossified body within Hoffa's fat pad. Electronically Signed   By: Narda Rutherford M.D.   On: 12/17/2023 17:32   DG Chest 2 View Result Date: 12/17/2023 CLINICAL DATA:  Weakness, fall. EXAM: CHEST - 2 VIEW COMPARISON:  Remote radiograph 09/28/2011 FINDINGS: Mild patchy opacity at the right lung base. Normal heart size and mediastinal contours. No pulmonary edema, pleural effusion or pneumothorax. No displaced rib fracture or acute osseous findings. IMPRESSION: Patchy right lower lobe opacity is nonspecific in the setting of fall. This may represent pneumonia in the appropriate clinical setting.  Pulmonary contusion could appear similar. Electronically Signed   By: Narda Rutherford M.D.   On: 12/17/2023 17:31    Vitals:   12/17/23 1357 12/17/23 2038 12/18/23 0501 12/18/23 1045  BP: 125/78 (!) 105/94 (!) 159/77 (!) 146/79  Pulse: 68 72 76 88  Resp: 18 18 18 18   Temp: 98.1 F (36.7 C) 98.1 F (36.7 C) 97.7 F (36.5 C) 98 F (36.7 C)  TempSrc: Oral Oral Oral Oral  SpO2: 100% 99% 96% 97%  Weight:  72.1 kg    Height:  5\' 10"  (1.778 m)       PHYSICAL EXAM General:  Alert, well-nourished, well-developed elderly patient in no acute distress Psych:  Mood and affect appropriate for situation Respiratory:  Regular, unlabored respirations on room air   NEURO:  Mental Status: AA&Ox3, patient is able to give clear and coherent history Speech/Language: speech is with moderate dysarthria but no aphasia  Cranial Nerves:  II: PERRL.  III, IV, VI: EOMI. Eyelids elevate symmetrically.  V: Sensation is intact to light touch and symmetrical to face.  VII: Left facial droop VIII: hearing intact to voice. IX, X: Voice is dysarthric XII: tongue is midline without fasciculations. Motor: Able to move all 4 extremities with good antigravity strength, increased tone noted on the left and some drift noted in the left leg Tone: is increased on the left with mild spasticity  sensation- Intact to light touch bilaterally.  Coordination: FTN intact bilaterally Gait- deferred  Most Recent NIH  1a Level of Conscious.: 0 1b LOC Questions: 0 1c LOC Commands: 0 2 Best Gaze: 0 3  Visual: 0 4 Facial Palsy: 1 5a Motor Arm - left: 0 5b Motor Arm - Right: 0 6a Motor Leg - Left: 1 6b Motor Leg - Right: 0 7 Limb Ataxia: 0 8 Sensory: 0 9 Best Language: 0 10 Dysarthria: 1 11 Extinct. and Inatten.:0 TOTAL: 3   ASSESSMENT/PLAN  [name] is a 63 y.o. 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  Patient was found to have pneumonia and is being treated appropriately with Levaquin.  MRI reveals new acute infarcts in left pons and high left frontal white matter.  NIH on Admission 3  Acute Ischemic Infarct: Small acute infarct in left pons and high left frontal white matter Etiology: Small vessel disease   CT head No acute abnormality.  CTA head & neck pending MRI acute infarct in left pons and high left frontal white matter, remote right posterior MCA territory and right pontine infarct 2D Echo pending Consider 30-day cardiac monitor at discharge LDL 83 HgbA1c 5.4 VTE prophylaxis -Lovenox clopidogrel 75 mg daily prior to admission, now on aspirin 81 mg daily and clopidogrel 75 mg daily for 3 weeks and then aspirin alone. Therapy recommendations:  Home Health PT Disposition: pending  Hx of Stroke/TIA Patient has a history of right pontine infarct in December 2022  Hypertension Home meds: Lisinopril 20 mg daily Stable Blood Pressure Goal: BP less than 220/110   Hyperlipidemia Home meds: Atorvastatin 40 mg daily, increased to 80 LDL 83, goal < 70 Continue statin at discharge  Tobacco Abuse Patient smokes 0.25 packs per day  Will assess readiness to quit and counsel tobacco cessation Nicotine replacement therapy provided  Substance abuse Patient has history of cocaine use UDS pending  Dysphagia Patient has post-stroke dysphagia, SLP consulted    Diet   DIET - DYS 1 Room service appropriate? Yes; Fluid consistency: Thin   Advance diet as tolerated  Other Stroke Risk Factors Peripheral arterial disease   Other Active Problems None  Hospital day # 1  Patient seen by NP with MD, MD to edit note as needed. Cortney E Ernestina Columbia , MSN, AGACNP-BC [facility] See Amion for schedule  and pager information 12/18/2023 12:13 PM   I have personally obtained history,examined this patient, reviewed notes, independently viewed imaging studies, participated in medical decision making and plan of care.ROS completed by me personally and pertinent positives fully documented  I have made any additions or clarifications directly to the above note. Agree with note above.  Patient presented with dysarthria and facial droop secondary to small frontal subcortical as well as pontine lacunar infarcts from small vessel disease.  Recommend aspirin and Plavix for 3 weeks followed by aspirin alone and aggressive risk factor modification.  Continue ongoing stroke workup and check echocardiogram CT angiogram studies.  Mobilize out of bed.  Therapy consults.  Patient counseled to quit smoking cigarettes and alcohol    Greater than 50% time during this 50-minute visit was spent in counseling and coordination of care about his lacunar strokes and discussion about evaluation and prevention and treatment and answering questions.  Delia Heady, MD Medical Director [facility] Pager: [phone] 12/18/2023 2:23 PM  To contact Stroke Continuity provider, please refer to [email or web address]. After hours, contact General Neurology

## 2023-12-19 DIAGNOSIS — M170 Bilateral primary osteoarthritis of knee: Secondary | ICD-10-CM

## 2023-12-19 DIAGNOSIS — J690 Pneumonitis due to inhalation of food and vomit: Secondary | ICD-10-CM | POA: Diagnosis not present

## 2023-12-19 DIAGNOSIS — R531 Weakness: Principal | ICD-10-CM

## 2023-12-19 DIAGNOSIS — W19XXXA Unspecified fall, initial encounter: Secondary | ICD-10-CM | POA: Diagnosis not present

## 2023-12-19 DIAGNOSIS — E7800 Pure hypercholesterolemia, unspecified: Secondary | ICD-10-CM

## 2023-12-19 DIAGNOSIS — I6930 Unspecified sequelae of cerebral infarction: Secondary | ICD-10-CM | POA: Diagnosis not present

## 2023-12-19 DIAGNOSIS — I10 Essential (primary) hypertension: Secondary | ICD-10-CM | POA: Diagnosis not present

## 2023-12-19 DIAGNOSIS — I6381 Other cerebral infarction due to occlusion or stenosis of small artery: Secondary | ICD-10-CM

## 2023-12-19 LAB — URINALYSIS, W/ REFLEX TO CULTURE (INFECTION SUSPECTED)
Bacteria, UA: NONE SEEN
Bilirubin Urine: NEGATIVE
Glucose, UA: NEGATIVE mg/dL
Hgb urine dipstick: NEGATIVE
Ketones, ur: NEGATIVE mg/dL
Leukocytes,Ua: NEGATIVE
Nitrite: NEGATIVE
Protein, ur: NEGATIVE mg/dL
Specific Gravity, Urine: 1.023 (ref 1.005–1.030)
pH: 7 (ref 5.0–8.0)

## 2023-12-19 LAB — BASIC METABOLIC PANEL
Anion gap: 9 (ref 5–15)
BUN: <5 mg/dL — ABNORMAL LOW (ref 8–23)
CO2: 27 mmol/L (ref 22–32)
Calcium: 8.5 mg/dL — ABNORMAL LOW (ref 8.9–10.3)
Chloride: 100 mmol/L (ref 98–111)
Creatinine, Ser: 0.68 mg/dL (ref 0.61–1.24)
GFR, Estimated: >60 mL/min (ref >60)
Glucose, Bld: 97 mg/dL (ref 70–99)
Potassium: 2.9 mmol/L — ABNORMAL LOW (ref 3.5–5.1)
Sodium: 136 mmol/L (ref 135–145)

## 2023-12-19 LAB — RAPID URINE DRUG SCREEN, HOSP PERFORMED
Amphetamines: NOT DETECTED
Barbiturates: NOT DETECTED
Benzodiazepines: NOT DETECTED
Cocaine: NOT DETECTED
Opiates: POSITIVE — AB
Tetrahydrocannabinol: NOT DETECTED

## 2023-12-19 LAB — CBC
HCT: 37.3 % — ABNORMAL LOW (ref 39.0–52.0)
Hemoglobin: 13.2 g/dL (ref 13.0–17.0)
MCH: 30.2 pg (ref 26.0–34.0)
MCHC: 35.4 g/dL (ref 30.0–36.0)
MCV: 85.4 fL (ref 80.0–100.0)
Platelets: 258 10*3/uL (ref 150–400)
RBC: 4.37 MIL/uL (ref 4.22–5.81)
RDW: 13.6 % (ref 11.5–15.5)
WBC: 6.7 10*3/uL (ref 4.0–10.5)
nRBC: 0 % (ref 0.0–0.2)

## 2023-12-19 LAB — MAGNESIUM: Magnesium: 1.8 mg/dL (ref 1.7–2.4)

## 2023-12-19 MED ORDER — HYDRALAZINE HCL 25 MG PO TABS
25.0000 mg | ORAL_TABLET | Freq: Four times a day (QID) | ORAL | Status: DC | PRN
  Administered 2023-12-22: 25 mg via ORAL
  Filled 2023-12-19: qty 1

## 2023-12-19 MED ORDER — POTASSIUM CHLORIDE 20 MEQ PO PACK
40.0000 meq | PACK | ORAL | Status: AC
  Administered 2023-12-19 (×3): 40 meq via ORAL
  Filled 2023-12-19 (×3): qty 2

## 2023-12-19 MED ORDER — MAGNESIUM SULFATE 2 GM/50ML IV SOLN
2.0000 g | Freq: Once | INTRAVENOUS | Status: AC
  Administered 2023-12-19: 2 g via INTRAVENOUS
  Filled 2023-12-19: qty 50

## 2023-12-19 MED ORDER — AMLODIPINE BESYLATE 10 MG PO TABS
10.0000 mg | ORAL_TABLET | Freq: Every day | ORAL | Status: DC
  Administered 2023-12-19 – 2023-12-24 (×6): 10 mg via ORAL
  Filled 2023-12-19 (×6): qty 1

## 2023-12-19 MED ORDER — DULOXETINE HCL 60 MG PO CPEP
60.0000 mg | ORAL_CAPSULE | Freq: Every day | ORAL | Status: DC
  Administered 2023-12-19 – 2023-12-24 (×6): 60 mg via ORAL
  Filled 2023-12-19 (×6): qty 1

## 2023-12-19 NOTE — Progress Notes (Signed)
PROGRESS NOTE  MANLY NESTLE HYQ:[id number] DOB: 1961/10/23   PCP: [name], MD  Patient is from: Home.  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  CXR with patchy RLL opacity/infiltrate.  X-ray of his left elbow showed irregularity at radial head concerning for nondisplaced fracture.  Patient was started on antibiotics for aspiration pneumonia.   Further workup with MRI brain showed acute left pontine and left frontal CVA.  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  TTE without significant finding.  A1c 5.1%.  LDL 45.  Patient was on Plavix at home.  Neurology recommended Plavix and aspirin.  Therapy recommended CIR.    Subjective: Seen and examined earlier this morning.  No major events overnight of this morning.  Patient has no complaints.  He is awake and alert and oriented x 4 except date.  Has dysarthria.   Objective: Vitals:   12/19/23 0038 12/19/23 0039 12/19/23 0431 12/19/23 1013  BP: (!) 175/85 (!) 168/88 (!) 182/106 (!) 173/90  Pulse: 91 67 79 69  Resp: 20  20 18   Temp: 98 F (36.7 C)  98.2 F (36.8 C)   TempSrc:   Oral   SpO2: 97%  95% 95%  Weight:      Height:        Examination:  GENERAL: No apparent distress.  Nontoxic. HEENT: MMM.  Vision and hearing grossly intact.  NECK: Supple.  No apparent JVD.  RESP:  No IWOB.  Fair aeration bilaterally. CVS:  RRR. Heart sounds normal.  ABD/GI/GU: BS+. Abd soft, NTND.  MSK/EXT:  Moves  extremities.  Significant muscle mass and subcu fat loss. SKIN: no apparent skin lesion or wound NEURO: Awake, alert and oriented appropriately.  Noted dysarthria.  No apparent focal neuro deficit. PSYCH: Calm. Normal affect.   Procedures:  None  Microbiology summarized: COVID-19, influenza and RSV PCR nonreactive Blood cultures NGTD  Assessment and plan: Aspiration pneumonia: Patient with history of vocal cord anomaly and CVA.  Presents with generalized weakness.  No report of respiratory symptoms.  CXR raise concern for RLL infiltrate. -Continue ceftriaxone and Flagyl -Aspiration precaution -Appreciate help by SLP-dysphagia 1 diet and barium swallow test    Mechanical fall at home: Imaging showed nondisplaced left radial head fracture.  CT head and hip x-ray without significant finding.  Ortho recommended sling for comfort and outpatient follow-up.  -Fall precaution -PT/OT eval   Acute ischemic CVA-likely embolic source History of CVA with residual left-sided deficit Dysarthria and dysphagia-patient with underlying vocal cord anomaly.  -MRI brain showed acute left pontine and left frontal CVA.  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  TTE without significant finding.  A1c 5.1%.  LDL 45.  -Appreciate neurorecommendations -Continue Plavix and aspirin for 3 weeks, then aspirin alone -Consider 30-day cardiac monitor at discharge -Will start normalizing BP   Left radial head nondisplaced fracture -Supportive care with sling as needed per  [facility]. -Outpatient follow-up   Hypokalemia -Monitor replenish as appropriate Potassium was 3.0 on presentation.  Has been replenished.  Check BMP in AM.   Essential hypertension: BP elevated -Continue home lisinopril -Add amlodipine 10 mg daily -P.o. hydralazine as needed   Hyperlipidemia LDL 45. -Continue Lipitor    Chronic knee osteoarthritis: No fractures -Continue pain control with Voltaren gel -PT/OT  Tobacco use disorder: Smokes about 1/4 pack a day. -Encourage cessation -Nicotine patch  Prior history of polysubstance use including cocaine -Check UDS  Body mass index is 22.81 kg/m.           DVT prophylaxis:  enoxaparin (LOVENOX) injection 40 mg Start: 12/18/23 1000 SCDs Start: 12/17/23 1932 Place TED hose Start: 12/17/23 1932  Code Status: Full code Family Communication: Attempted to call patient's wife for update but did not answer.  Did not leave voicemail. Level of care: Telemetry Medical Status is: Inpatient Remains inpatient appropriate because: Aspiration pneumonia, acute CVA   Final disposition: CIR Consultants:  Neurology  55 minutes with more than 50% spent in reviewing records, counseling patient/family and coordinating care.   Sch Meds:  Scheduled Meds:  amLODipine  10 mg Oral Daily   aspirin EC  81 mg Oral Daily   atorvastatin  80 mg Oral q1800   clopidogrel  75 mg Oral Daily   DULoxetine  60 mg Oral Daily   enoxaparin (LOVENOX) injection  40 mg Subcutaneous Q24H   famotidine  20 mg Oral BID   fluticasone  1 spray Each Nare Daily   lisinopril  20 mg Oral Daily   sodium chloride flush  3 mL Intravenous Q12H   sodium chloride flush  3 mL Intravenous Q12H   Continuous Infusions:  cefTRIAXone (ROCEPHIN)  IV 2 g (12/18/23 2117)   metronidazole 500 mg (12/19/23 0805)   PRN Meds:.acetaminophen **OR** acetaminophen, diclofenac Sodium, hydrALAZINE, HYDROcodone-acetaminophen, ondansetron **OR** ondansetron (ZOFRAN) IV, sodium chloride flush  Antimicrobials: Anti-infectives (From admission, onward)    Start     Dose/Rate Route Frequency Ordered Stop   12/17/23 2100  cefTRIAXone (ROCEPHIN) 2 g in sodium chloride 0.9 % 100 mL IVPB        2 g 200 mL/hr over 30 Minutes Intravenous Every 24 hours 12/17/23 2005 12/24/23 2059   12/17/23 2000  metroNIDAZOLE  (FLAGYL) IVPB 500 mg        500 mg 100 mL/hr over 60 Minutes Intravenous Every 12 hours 12/17/23 1940     12/17/23 1945  levofloxacin (LEVAQUIN) IVPB 750 mg  Status:  Discontinued        750 mg 100 mL/hr over 90 Minutes Intravenous Every 48 hours 12/17/23 1940 12/17/23 1941   12/17/23 1945  levofloxacin (LEVAQUIN) IVPB 750 mg  Status:  Discontinued        750 mg 100 mL/hr over 90 Minutes Intravenous Every 24 hours 12/17/23 1941 12/17/23 2005   12/17/23 1915  levofloxacin (LEVAQUIN) IVPB 750 mg        750 mg 100 mL/hr over 90 Minutes Intravenous  Once 12/17/23 1909 12/18/23 0056        I have personally reviewed the following labs and images: CBC: Recent Labs  Lab 12/17/23 1741 12/18/23 1133 12/19/23 1044  WBC 14.3* 7.8 6.7  NEUTROABS 10.8*  --   --   HGB 14.1 12.4* 13.2  HCT 41.1 35.7* 37.3*  MCV 87.1 86.7 85.4  PLT 248 209 258   BMP &GFR Recent Labs  Lab 12/17/23 1741 12/18/23 1133 12/19/23 1044  NA  138 138 136  K 3.0* 2.8* 2.9*  CL 96* 98 100  CO2 30 27 27   GLUCOSE 81 122* 97  BUN 8 6* <5*  CREATININE 0.65 0.65 0.68  CALCIUM 8.9 8.5* 8.5*  MG  --   --  1.8   Estimated Creatinine Clearance: 97.6 mL/min (by C-G formula based on SCr of 0.68 mg/dL). Liver & Pancreas: Recent Labs  Lab 12/17/23 1741 12/18/23 1133  AST 20 20  ALT 12 12  ALKPHOS 84 67  BILITOT 1.3* 1.1  PROT 6.5 5.7*  ALBUMIN 2.9* 2.5*   No results for input(s): "LIPASE", "AMYLASE" in the last 168 hours. No results for input(s): "AMMONIA" in the last 168 hours. Diabetic: Recent Labs    12/18/23 1134  HGBA1C 5.1   No results for input(s): "GLUCAP" in the last 168 hours. Cardiac Enzymes: No results for input(s): "CKTOTAL", "CKMB", "CKMBINDEX", "TROPONINI" in the last 168 hours. No results for input(s): "PROBNP" in the last 8760 hours. Coagulation Profile: Recent Labs  Lab 12/17/23 1741  INR 1.2   Thyroid Function Tests: No results for input(s): "TSH", "T4TOTAL", "FREET4",  "T3FREE", "THYROIDAB" in the last 72 hours. Lipid Profile: Recent Labs    12/18/23 1133  CHOL 85  HDL 21*  LDLCALC 45  TRIG 94  CHOLHDL 4.0   Anemia Panel: No results for input(s): "VITAMINB12", "FOLATE", "FERRITIN", "TIBC", "IRON", "RETICCTPCT" in the last 72 hours. Urine analysis:    Component Value Date/Time   COLORURINE YELLOW 01/29/2017 1841   APPEARANCEUR CLEAR 01/29/2017 1841   LABSPEC 1.028 01/29/2017 1841   PHURINE 5.0 01/29/2017 1841   GLUCOSEU NEGATIVE 01/29/2017 1841   HGBUR NEGATIVE 01/29/2017 1841   BILIRUBINUR NEGATIVE 01/29/2017 1841   KETONESUR NEGATIVE 01/29/2017 1841   PROTEINUR NEGATIVE 01/29/2017 1841   NITRITE NEGATIVE 01/29/2017 1841   LEUKOCYTESUR NEGATIVE 01/29/2017 1841   Sepsis Labs: Invalid input(s): "PROCALCITONIN", "LACTICIDVEN"  Microbiology: Recent Results (from the past 240 hours)  Resp panel by RT-PCR (RSV, Flu A&B, Covid) Anterior Nasal Swab     Status: None   Collection Time: 12/17/23  4:04 PM   Specimen: Anterior Nasal Swab  Result Value Ref Range Status   SARS Coronavirus 2 by RT PCR NEGATIVE NEGATIVE Final   Influenza A by PCR NEGATIVE NEGATIVE Final   Influenza B by PCR NEGATIVE NEGATIVE Final    Comment: (NOTE) The Xpert Xpress SARS-CoV-2/FLU/RSV plus assay is intended as an aid in the diagnosis of influenza from Nasopharyngeal swab specimens and should not be used as a sole basis for treatment. Nasal washings and aspirates are unacceptable for Xpert Xpress SARS-CoV-2/FLU/RSV testing.  Fact Sheet for Patients: BloggerCourse.com  Fact Sheet for Healthcare Providers: SeriousBroker.it  This test is not yet approved or cleared by the [place] FDA and has been authorized for detection and/or diagnosis of SARS-CoV-2 by FDA under an Emergency Use Authorization (EUA). This EUA will remain in effect (meaning this test can be used) for the duration of the COVID-19 declaration  under Section 564(b)(1) of the Act, 21 U.S.C. section 360bbb-3(b)(1), unless the authorization is terminated or revoked.     Resp Syncytial Virus by PCR NEGATIVE NEGATIVE Final    Comment: (NOTE) Fact Sheet for Patients: BloggerCourse.com  Fact Sheet for Healthcare Providers: SeriousBroker.it  This test is not yet approved or cleared by the [place] FDA and has been authorized for detection and/or diagnosis of SARS-CoV-2 by FDA under an Emergency Use Authorization (EUA). This EUA will remain in effect (meaning this test  can be used) for the duration of the COVID-19 declaration under Section 564(b)(1) of the Act, 21 U.S.C. section 360bbb-3(b)(1), unless the authorization is terminated or revoked.  Performed at [facility], [address], [place], [place] [address]   Culture, blood (Routine X 2) w Reflex to ID Panel     Status: None (Preliminary result)   Collection Time: 12/18/23 11:24 AM   Specimen: BLOOD  Result Value Ref Range Status   Specimen Description BLOOD BLOOD LEFT ARM  Final   Special Requests   Final    AEROBIC BOTTLE ONLY Blood Culture results may not be optimal due to an inadequate volume of blood received in culture bottles   Culture   Final    NO GROWTH < 24 HOURS Performed at [facility], [address] [place], [place] [address]    Report Status PENDING  Incomplete  Culture, blood (Routine X 2) w Reflex to ID Panel     Status: None (Preliminary result)   Collection Time: 12/18/23 11:33 AM   Specimen: BLOOD  Result Value Ref Range Status   Specimen Description BLOOD BLOOD LEFT ARM  Final   Special Requests   Final    BOTTLES DRAWN AEROBIC AND ANAEROBIC Blood Culture results may not be optimal due to an inadequate volume of blood received in culture bottles   Culture   Final    NO GROWTH < 24 HOURS Performed at [facility], [address] [place], [place] [address]    Report  Status PENDING  Incomplete    Radiology Studies: CT ANGIO HEAD NECK W WO CM Result Date: 12/19/2023 CLINICAL DATA:  Stroke/TIA, determine embolic source MRI showed subacute ischemic stroke. EXAM: CT ANGIOGRAPHY HEAD AND NECK WITH AND WITHOUT CONTRAST TECHNIQUE: Multidetector CT imaging of the head and neck was performed using the standard protocol during bolus administration of intravenous contrast. Multiplanar CT image reconstructions and MIPs were obtained to evaluate the vascular anatomy. Carotid stenosis measurements (when applicable) are obtained utilizing NASCET criteria, using the distal internal carotid diameter as the denominator. RADIATION DOSE REDUCTION: This exam was performed according to the departmental dose-optimization program which includes automated exposure control, adjustment of the mA and/or kV according to patient size and/or use of iterative reconstruction technique. CONTRAST:  75mL OMNIPAQUE IOHEXOL 350 MG/ML SOLN COMPARISON:  MRI head December 17, 2023. CTA head/neck October 31, 2021. FINDINGS: CT HEAD FINDINGS Brain: Known acute infarcts better seen on recent MRI. Remote right posterior MCA territory infarct. No evidence of acute hemorrhage, mass lesion, midline shift or hydrocephalus. Chronic microvascular ischemic change. Vascular: See below. Skull: No acute fracture. Sinuses/Orbits: Clear sinuses.  No acute orbital findings. Other: No mastoid effusions. Review of the MIP images confirms the above findings CTA NECK FINDINGS Aortic arch: Great vessel origins are patent without significant stenosis. Aortic atherosclerosis. Right carotid system: No evidence of dissection, stenosis (50% or greater), or occlusion. Left carotid system: No evidence of dissection, stenosis (50% or greater), or occlusion. Vertebral arteries: Multifocal severe stenosis of the right V2 vertebral artery. Left vertebral artery is patent without greater than 50% stenosis. Skeleton: No acute abnormality on  limited assessment. Other neck: With no acute abnormality on limited assessment. Upper chest: Emphysema. Review of the MIP images confirms the above findings CTA HEAD FINDINGS Anterior circulation: Bilateral intracranial ICAs, MCAs, and ACAs are patent without proximal high-grade stenosis. Posterior circulation: Unchanged appearance of occluded right intradural vertebral artery distal to PICA. Left vertebral artery remains  patent. Unchanged moderate basilar artery stenosis. Bilateral posterior cerebral arteries are patent without proximal high-grade stenosis. Venous sinuses: As permitted by contrast timing, patent. Review of the MIP images confirms the above findings IMPRESSION: 1. No emergent large vessel occlusion. 2. Occluded non dominant right intradural vertebral artery appears similar to the 2022 prior. 3. 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 4. Aortic Atherosclerosis (ICD10-I70.0) and Emphysema (ICD10-J43.9). Electronically Signed   By: Feliberto Harts M.D.   On: 12/19/2023 01:03      Kareem Aul T. Odarius Dines [facility]  If 7PM-7AM, please contact night-coverage [email or web address] 12/19/2023, 11:43 AM

## 2023-12-19 NOTE — Plan of Care (Signed)

## 2023-12-19 NOTE — Plan of Care (Signed)
Updated patient's wife and daughter over the phone.  Patient told them that he would be discharged from the hospital and called them to pick him up.  Patient's wife and daughter concerned about this.  Family said that he was confused when they saw him yesterday.  Patient is awake and alert and oriented x 4 except date but does not understand about his medical condition and pros and cons of treatment options clearly.  He does not demonstrate capacity to make medical decision.  Patient's wife is HCPOA.

## 2023-12-19 NOTE — Progress Notes (Signed)
STROKE TEAM PROGRESS NOTE   INTERIM HISTORY/SUBJECTIVE Patient states she is doing well.  He wants to go home.  Echocardiogram shows normal ejection fraction with normal left atrial size.  Has been hemodynamically stable and afebrile overnight.  CT angiogram shows no large vessel occlusion.  Occluded nondominant right vertebral artery unchanged from 2022.  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 OBJECTIVE  CBC    Component Value Date/Time   WBC 6.7 12/19/2023 1044   RBC 4.37 12/19/2023 1044   HGB 13.2 12/19/2023 1044   HCT 37.3 (L) 12/19/2023 1044   PLT 258 12/19/2023 1044   MCV 85.4 12/19/2023 1044   MCH 30.2 12/19/2023 1044   MCHC 35.4 12/19/2023 1044   RDW 13.6 12/19/2023 1044   LYMPHSABS 2.5 12/17/2023 1741   MONOABS 0.9 12/17/2023 1741   EOSABS 0.1 12/17/2023 1741   BASOSABS 0.1 12/17/2023 1741    BMET    Component Value Date/Time   NA 136 12/19/2023 1044   K 2.9 (L) 12/19/2023 1044   CL 100 12/19/2023 1044   CO2 27 12/19/2023 1044   GLUCOSE 97 12/19/2023 1044   BUN <5 (L) 12/19/2023 1044   CREATININE 0.68 12/19/2023 1044   CALCIUM 8.5 (L) 12/19/2023 1044   GFRNONAA >60 12/19/2023 1044    IMAGING past 24 hours CT ANGIO HEAD NECK W WO CM Result Date: 12/19/2023 CLINICAL DATA:  Stroke/TIA, determine embolic source MRI showed subacute ischemic stroke. EXAM: CT ANGIOGRAPHY HEAD AND NECK WITH AND WITHOUT CONTRAST TECHNIQUE: Multidetector CT imaging of the head and neck was performed using the standard protocol during bolus administration of intravenous contrast. Multiplanar CT image reconstructions and MIPs were obtained to evaluate the vascular anatomy. Carotid stenosis measurements (when applicable) are obtained utilizing NASCET criteria, using the distal internal carotid diameter as the denominator. RADIATION DOSE REDUCTION: This exam was performed according to the departmental dose-optimization program which includes automated exposure control,  adjustment of the mA and/or kV according to patient size and/or use of iterative reconstruction technique. CONTRAST:  75mL OMNIPAQUE IOHEXOL 350 MG/ML SOLN COMPARISON:  MRI head December 17, 2023. CTA head/neck October 31, 2021. FINDINGS: CT HEAD FINDINGS Brain: Known acute infarcts better seen on recent MRI. Remote right posterior MCA territory infarct. No evidence of acute hemorrhage, mass lesion, midline shift or hydrocephalus. Chronic microvascular ischemic change. Vascular: See below. Skull: No acute fracture. Sinuses/Orbits: Clear sinuses.  No acute orbital findings. Other: No mastoid effusions. Review of the MIP images confirms the above findings CTA NECK FINDINGS Aortic arch: Great vessel origins are patent without significant stenosis. Aortic atherosclerosis. Right carotid system: No evidence of dissection, stenosis (50% or greater), or occlusion. Left carotid system: No evidence of dissection, stenosis (50% or greater), or occlusion. Vertebral arteries: Multifocal severe stenosis of the right V2 vertebral artery. Left vertebral artery is patent without greater than 50% stenosis. Skeleton: No acute abnormality on limited assessment. Other neck: With no acute abnormality on limited assessment. Upper chest: Emphysema. Review of the MIP images confirms the above findings CTA HEAD FINDINGS Anterior circulation: Bilateral intracranial ICAs, MCAs, and ACAs are patent without proximal high-grade stenosis. Posterior circulation: Unchanged appearance of occluded right intradural vertebral artery distal to PICA. Left vertebral artery remains patent. Unchanged moderate basilar artery stenosis. Bilateral posterior cerebral arteries are patent without proximal high-grade stenosis. Venous sinuses: As permitted by contrast timing, patent. Review of the MIP images confirms the above findings IMPRESSION: 1. No emergent large vessel occlusion. 2. Occluded non dominant right intradural  vertebral artery appears similar to the  2022 prior. 3. 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 4. Aortic Atherosclerosis (ICD10-I70.0) and Emphysema (ICD10-J43.9). Electronically Signed   By: Feliberto Harts M.D.   On: 12/19/2023 01:03    Vitals:   12/19/23 0038 12/19/23 0039 12/19/23 0431 12/19/23 1013  BP: (!) 175/85 (!) 168/88 (!) 182/106 (!) 173/90  Pulse: 91 67 79 69  Resp: 20  20 18   Temp: 98 F (36.7 C)  98.2 F (36.8 C)   TempSrc:   Oral   SpO2: 97%  95% 95%  Weight:      Height:         PHYSICAL EXAM General:  Alert, well-nourished, well-developed elderly patient in no acute distress Psych:  Mood and affect appropriate for situation Respiratory:  Regular, unlabored respirations on room air   NEURO:  Mental Status: AA&Ox3, patient is able to give clear and coherent history Speech/Language: speech is with moderate dysarthria but no aphasia  Cranial Nerves:  II: PERRL.  III, IV, VI: EOMI. Eyelids elevate symmetrically.  V: Sensation is intact to light touch and symmetrical to face.  VII: Left facial droop VIII: hearing intact to voice. IX, X: Voice is dysarthric XII: tongue is midline without fasciculations. Motor: Able to move all 4 extremities with good antigravity strength, increased tone noted on the left and some drift noted in the left leg Tone: is increased on the left with mild spasticity  sensation- Intact to light touch bilaterally.  Coordination: FTN intact bilaterally Gait- deferred  Most Recent NIH  1a Level of Conscious.: 0 1b LOC Questions: 0 1c LOC Commands: 0 2 Best Gaze: 0 3 Visual: 0 4 Facial Palsy: 1 5a Motor Arm - left: 0 5b Motor Arm - Right: 0 6a Motor Leg - Left: 1 6b Motor Leg - Right: 0 7 Limb Ataxia: 0 8 Sensory: 0 9 Best Language: 0 10 Dysarthria: 1 11 Extinct. and Inatten.:0 TOTAL: 3   ASSESSMENT/PLAN  Hector Booker is a 63 y.o. male with history of stroke with residual left-sided weakness, hypertension, PAD with left external iliac  artery occlusion, right vertebral artery occlusion, polysubstance abuse and peripheral neuropathy and hyperlipidemia admitted for several days of weakness, inability to ambulate and slurred speech.  Patient was found to have pneumonia and is being treated appropriately with Levaquin.  MRI reveals new acute infarcts in left pons and high left frontal white matter.  NIH on Admission 3  Acute Ischemic Infarct: Small acute infarct in left pons and high left frontal white matter Etiology: Small vessel disease   CT head No acute abnormality.  CTA head & neck occluded nondominant right intradural vertebral artery.  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   MRI acute infarct in left pons and high left frontal white matter, remote right posterior MCA territory and right pontine infarct 2D Echo ejection fraction 55 to 60%.  Left atrial size normal Consider 30-day cardiac monitor at discharge LDL 83 HgbA1c 5.4 VTE prophylaxis -Lovenox clopidogrel 75 mg daily prior to admission, now on aspirin 81 mg daily and clopidogrel 75 mg daily for 3 weeks and then aspirin alone. Therapy recommendations:  Home Health PT Disposition: pending  Hx of Stroke/TIA Patient has a history of right pontine infarct in December 2022  Hypertension Home meds: Lisinopril 20 mg daily Stable Blood Pressure Goal: BP less than 220/110   Hyperlipidemia Home meds: Atorvastatin 40 mg daily, increased to 80 LDL 83, goal < 70 Continue statin  at discharge  Tobacco Abuse Patient smokes 0.25 packs per day  Will assess readiness to quit and counsel tobacco cessation Nicotine replacement therapy provided  Substance abuse Patient has history of cocaine use UDS pending  Dysphagia Patient has post-stroke dysphagia, SLP consulted    Diet   DIET - DYS 1 Room service appropriate? Yes; Fluid consistency: Thin   Advance diet as tolerated  Other Stroke Risk Factors Peripheral arterial disease   Other Active  Problems None  Hospital day # 2  Patient presented with dysarthria and facial droop secondary to small frontal subcortical as well as pontine lacunar infarcts from small vessel disease.  Recommend aspirin and Plavix for 3 weeks followed by aspirin alone and aggressive risk factor modification.   Mobilize out of bed.  Therapy consults.  Patient counseled to quit smoking cigarettes and alcohol   Stroke team will sign off.  Kindly call for questions.  Follow-up as an outpatient stroke clinic in 2 months.  Greater than 50% time during this 35 minute visit was spent in counseling and coordination of care about his lacunar strokes and discussion about evaluation and prevention and treatment and answering questions.  Delia Heady, MD Medical Director [facility] Pager: [phone] 12/19/2023 12:02 PM  To contact Stroke Continuity provider, please refer to [email or web address]. After hours, contact General Neurology

## 2023-12-19 NOTE — Progress Notes (Signed)
Pt is refusing swallow barium test at this time.

## 2023-12-19 NOTE — Progress Notes (Signed)
Physical Therapy Treatment Patient Details Name: Hector Booker MRN: [id number] DOB: Mar 31, 1961 Today's Date: 12/19/2023   History of Present Illness Pt is a 63 y/o M presenting to ED with weakness and slurred speech after sustaining fall at home. MRI revealed acute infarcts and found to have pneumonia. PMH includes CVA with residual left sided weakness, essential hypertension and hyperlipidemia.    PT Comments  Progressing towards acute functional goals. CGA with bed mobility, light min assist for transfers out of bed. RW for support. Declined to ambulate at this time, states he may be willing to try later. Still a bit confused with reduced awareness of deficits and safety. Eager to get out of bed today; able to review some LE exercises. Patient will continue to benefit from skilled physical therapy services to further improve independence with functional mobility.     If plan is discharge home, recommend the following: A little help with walking and/or transfers;A little help with bathing/dressing/bathroom;Assistance with cooking/housework;Direct supervision/assist for financial management;Supervision due to cognitive status;Assist for transportation;Direct supervision/assist for medications management;Help with stairs or ramp for entrance   Can travel by private vehicle        Equipment Recommendations  Other (comment) (TBD in next venue)    Recommendations for Other Services       Precautions / Restrictions Precautions Precautions: Fall Precaution/Restrictions Comments: L hemiparesis Restrictions Other Position/Activity Restrictions: LUE sling for comfort per orders     Mobility  Bed Mobility Overal bed mobility: Needs Assistance Bed Mobility: Rolling, Sidelying to Sit Rolling: Contact guard assist Sidelying to sit: Contact guard assist       General bed mobility comments: CGA to roll, using rail, pulling with RUE. CGA to rise slowly. Cues for technique.     Transfers Overall transfer level: Needs assistance Equipment used: Rolling walker (2 wheels) Transfers: Sit to/from Stand, Bed to chair/wheelchair/BSC Sit to Stand: Min assist   Step pivot transfers: Min assist       General transfer comment: Light min assist for boost to stand, slow to rise, increased flexor tone LEs. Min assist for balance with step pivot to recliner.    Ambulation/Gait               General Gait Details: Declined, states he will try later.   Stairs             Wheelchair Mobility     Tilt Bed    Modified Rankin (Stroke Patients Only)       Balance Overall balance assessment: Needs assistance Sitting-balance support: Feet supported, Bilateral upper extremity supported Sitting balance-Leahy Scale: Fair Sitting balance - Comments: CGA Postural control: Right lateral lean Standing balance support: Bilateral upper extremity supported, Reliant on assistive device for balance Standing balance-Leahy Scale: Poor                              Communication Communication Communication: Impaired Factors Affecting Communication: Reduced clarity of speech  Cognition Arousal: Alert Behavior During Therapy: Flat affect   PT - Cognitive impairments: No family/caregiver present to determine baseline                       PT - Cognition Comments: decreased insight into deficits Following commands: Intact      Cueing Cueing Techniques: Verbal cues  Exercises General Exercises - Lower Extremity Ankle Circles/Pumps: AROM, Both, 10 reps, Seated Quad Sets: Strengthening, Both, 10 reps, Seated  General Comments        Pertinent Vitals/Pain Pain Assessment Pain Assessment: No/denies pain    Home Living                          Prior Function            PT Goals (current goals can now be found in the care plan section) Acute Rehab PT Goals Patient Stated Goal: to go home PT Goal Formulation: With  patient Time For Goal Achievement: 01/01/24 Potential to Achieve Goals: Good Progress towards PT goals: Progressing toward goals    Frequency    Min 1X/week      PT Plan      Co-evaluation              AM-PAC PT "6 Clicks" Mobility   Outcome Measure  Help needed turning from your back to your side while in a flat bed without using bedrails?: A Little Help needed moving from lying on your back to sitting on the side of a flat bed without using bedrails?: A Little Help needed moving to and from a bed to a chair (including a wheelchair)?: A Little Help needed standing up from a chair using your arms (e.g., wheelchair or bedside chair)?: A Little Help needed to walk in hospital room?: A Lot Help needed climbing 3-5 steps with a railing? : A Lot 6 Click Score: 16    End of Session Equipment Utilized During Treatment: Gait belt Activity Tolerance: Patient tolerated treatment well (A bit self-limiting, could likely ambulate in room with assist but declined) Patient left: with call bell/phone within reach;in chair;with chair alarm set Nurse Communication: Mobility status PT Visit Diagnosis: Unsteadiness on feet (R26.81);Muscle weakness (generalized) (M62.81);Hemiplegia and hemiparesis;Other abnormalities of gait and mobility (R26.89) Hemiplegia - Right/Left: Left Hemiplegia - dominant/non-dominant: Non-dominant Hemiplegia - caused by: Cerebral infarction     Time: 1007-1020 PT Time Calculation (min) (ACUTE ONLY): 13 min  Charges:    $Therapeutic Activity: 8-22 mins PT General Charges $$ ACUTE PT VISIT: 1 Visit                     Kathlyn Sacramento, PT, DPT [facility]  Rehabilitation Services Physical Therapist Office: [phone] Website: [email or web address]    Berton Mount 12/19/2023, 12:27 PM

## 2023-12-19 NOTE — Progress Notes (Signed)
Pt breakfast called in .

## 2023-12-19 NOTE — Progress Notes (Signed)
Speech Language Pathology Treatment: Dysphagia  Patient Details Name: Hector Booker MRN: [id number] DOB: [birth year] Today's Date: 12/19/2023 Time: 6578-4696 SLP Time Calculation (min) (ACUTE ONLY): 19 min  Assessment / Plan / Recommendation Clinical Impression  Pt seen for swallow treatment this date, with inclusion of education regarding Modified Barium Swallow Study (MBSS) purpose and procedures. Pt chose to not participate in a swallow study today and radiology had minimal ability to accommodate given full schedule and reduced staffing. Educated pt regarding purpose of MBSS, which is to assist in diagnosis of swallowing impairment and to guide dysphagia treatment. Informed him of basic procedures of MBSS and that a family member could come to support. After questions were answered to family/pt satisfaction, pt agreeable to completion of MBSS on subsequent date (radiology schedule permitting). After discussion, provided pt with dys 3 fruit cup (peaches) and thin liquids (soda). He does not prefer water and declined pureed solid for skilled observation. Mastication of peaches was prolonged and suspect this is due to absent dentition. He opened oral cavity to show clinician that he cleared it of POs, however semi broken down peaches were observed on lingual body. He cleared after continued mastication. Coughing x1 occurred during solid consumption and x1 with thin liquids by straw across several attempts. He did self pace sips, one at a time today and this may have contributed to overall decrease in s/x of aspiration. Will f/u on subsequent date for possible MBSS completion. Continue current diet as ordered by MD with adherence to aspiration/swallow precautions.     HPI HPI: Pt is a 63 y/o M presenting to ED with weakness, slurred speech, and trouble swallowing after sustaining fall at home. MRI revealed acute infarcts in the left pons and high left frontal white matter. CXR concerning for RLL PNA. PMH  includes CVA (R MCA, R pons) with residual left sided weakness, essential hypertension and hyperlipidemia.      SLP Plan  Continue with current plan of care;MBS      Recommendations for follow up therapy are one component of a multi-disciplinary discharge planning process, led by the attending physician.  Recommendations may be updated based on patient status, additional functional criteria and insurance authorization.    Recommendations  Diet recommendations:  (diet ordered per MD- dys 1/thin) Medication Administration: Crushed with puree Supervision: Patient able to self feed;Full supervision/cueing for compensatory strategies Compensations: Minimize environmental distractions;Slow rate;Small sips/bites Postural Changes and/or Swallow Maneuvers: Seated upright 90 degrees                  Oral care QID     Dysphagia, unspecified (R13.10)     Continue with current plan of care;MBS      Avie Echevaria, MA, CCC-SLP Acute Rehabilitation Services Office Number: [phone]  Paulette Blanch  12/19/2023, 3:43 PM

## 2023-12-20 DIAGNOSIS — I6930 Unspecified sequelae of cerebral infarction: Secondary | ICD-10-CM | POA: Diagnosis not present

## 2023-12-20 DIAGNOSIS — W19XXXA Unspecified fall, initial encounter: Secondary | ICD-10-CM | POA: Diagnosis not present

## 2023-12-20 DIAGNOSIS — J690 Pneumonitis due to inhalation of food and vomit: Secondary | ICD-10-CM | POA: Diagnosis not present

## 2023-12-20 DIAGNOSIS — I10 Essential (primary) hypertension: Secondary | ICD-10-CM | POA: Diagnosis not present

## 2023-12-20 LAB — RENAL FUNCTION PANEL
Albumin: 2.7 g/dL — ABNORMAL LOW (ref 3.5–5.0)
Anion gap: 11 (ref 5–15)
BUN: <5 mg/dL — ABNORMAL LOW (ref 8–23)
CO2: 26 mmol/L (ref 22–32)
Calcium: 8.4 mg/dL — ABNORMAL LOW (ref 8.9–10.3)
Chloride: 99 mmol/L (ref 98–111)
Creatinine, Ser: 0.57 mg/dL — ABNORMAL LOW (ref 0.61–1.24)
GFR, Estimated: >60 mL/min (ref >60)
Glucose, Bld: 87 mg/dL (ref 70–99)
Phosphorus: 3.5 mg/dL (ref 2.5–4.6)
Potassium: 3 mmol/L — ABNORMAL LOW (ref 3.5–5.1)
Sodium: 136 mmol/L (ref 135–145)

## 2023-12-20 LAB — CBC
HCT: 37.5 % — ABNORMAL LOW (ref 39.0–52.0)
Hemoglobin: 13.1 g/dL (ref 13.0–17.0)
MCH: 29.8 pg (ref 26.0–34.0)
MCHC: 34.9 g/dL (ref 30.0–36.0)
MCV: 85.4 fL (ref 80.0–100.0)
Platelets: 242 10*3/uL (ref 150–400)
RBC: 4.39 MIL/uL (ref 4.22–5.81)
RDW: 13.7 % (ref 11.5–15.5)
WBC: 6.5 10*3/uL (ref 4.0–10.5)
nRBC: 0 % (ref 0.0–0.2)

## 2023-12-20 LAB — MAGNESIUM: Magnesium: 2.2 mg/dL (ref 1.7–2.4)

## 2023-12-20 MED ORDER — POTASSIUM CHLORIDE 20 MEQ PO PACK
40.0000 meq | PACK | ORAL | Status: AC
  Administered 2023-12-20 (×2): 40 meq via ORAL
  Filled 2023-12-20 (×2): qty 2

## 2023-12-20 NOTE — Progress Notes (Signed)
SLP Cancellation Note  Patient Details Name: Hector Booker MRN: [id number] DOB: [birth year]   Cancelled treatment:       Reason Eval/Treat Not Completed: Other (comment) (Pt's RN was updated regarding the inability to schedule the MBS today. Pt's RN stated that, per report, pt remains symptomatic of dysphagia. Pt has requested that diet modification be deferred; pt's RN was advised to encourage pt to continue to observe the recommended swallowing precautions and she agreed.)  Walta [place]ville I. Vear Clock, MS, CCC-SLP Acute Rehabilitation Services Office number [phone]  Scheryl Marten 12/20/2023, 1:01 PM

## 2023-12-20 NOTE — Progress Notes (Signed)
PROGRESS NOTE  Hector Booker:[id number] DOB: 1961-04-05   PCP: [name], MD  Patient is from: Home.  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  CXR with patchy RLL opacity/infiltrate.  X-ray of his left elbow showed irregularity at radial head concerning for nondisplaced fracture.  Patient was started on antibiotics for aspiration pneumonia.   Further workup with MRI brain showed acute left pontine and left frontal CVA.  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  TTE without significant finding.  A1c 5.1%.  LDL 45.  Patient was on Plavix at home.  Neurology recommended Plavix and aspirin.  Therapy recommended CIR.    Subjective: Seen and examined earlier this morning.  No major events overnight of this morning.  No complaints but he is asking to go home.  He is oriented x 4 but does not seem to understand the severity of his medical condition and its severity.  Objective: Vitals:   12/19/23 1857 12/19/23 1956 12/20/23 0429 12/20/23 0835  BP: (!) 159/91 (!) 140/89 132/76 (!) 152/82  Pulse: 69 84 66 64  Resp: 20 18 18 18   Temp:  97.8 F (36.6 C) 98 F (36.7 C) 97.9 F (36.6 C)  TempSrc:   Oral   SpO2: 96% 93% 95% 93%  Weight:      Height:        Examination:  GENERAL: No apparent distress.  Nontoxic. HEENT: MMM.  Vision and hearing grossly intact.  NECK: Supple.  No apparent JVD.  RESP:  No IWOB.  Fair aeration bilaterally. CVS:  RRR.  Heart sounds normal.  ABD/GI/GU: BS+. Abd soft, NTND.  MSK/EXT:  Moves extremities.  Significant muscle mass and subcu fat loss. SKIN: no apparent skin lesion or wound NEURO: Awake, alert and oriented appropriately.  Noted dysarthria.  No apparent focal neuro deficit. PSYCH: Calm. Normal affect.   Procedures:  None  Microbiology summarized: COVID-19, influenza and RSV PCR nonreactive Blood cultures NGTD  Assessment and plan: Aspiration pneumonia: Patient with history of vocal cord anomaly and CVA.  Presents with generalized weakness.  No report of respiratory symptoms.  CXR raise concern for RLL infiltrate. -Continue ceftriaxone and Flagyl -Aspiration precaution -Appreciate help by SLP-dysphagia 1 diet and barium swallow test    Mechanical fall at home: Imaging showed nondisplaced left radial head fracture.  CT head and hip x-ray without significant finding.  Ortho recommended sling for comfort and outpatient follow-up.  -Fall precaution -PT/OT eval   Acute ischemic CVA-likely embolic source History of CVA with residual left-sided deficit Dysarthria and dysphagia-patient with underlying vocal cord anomaly.  -MRI brain showed acute left pontine and left frontal CVA.  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  TTE without significant finding.  A1c 5.1%.  LDL 45.  -Appreciate neurorecommendations -Continue Plavix and aspirin for 3 weeks, then aspirin alone -Consider 30-day cardiac monitor at discharge.  Will consult cardiology upon discharge -  Will start normalizing BP   Left radial head nondisplaced fracture -Supportive care with sling as needed per [facility]. -Outpatient follow-up   Refractory hypokalemia -Monitor replenish as appropriate   Essential hypertension: BP elevated -Continue home lisinopril -Added amlodipine 10 mg daily here. -P.o. hydralazine as  needed   Hyperlipidemia LDL 45. -Continue Lipitor   Chronic knee osteoarthritis: No fractures -Continue pain control with Voltaren gel -PT/OT  Tobacco use disorder: Smokes about 1/4 pack a day. -Encourage cessation -Nicotine patch  Prior history of polysubstance use including cocaine: UDS positive for opiates.  Body mass index is 22.81 kg/m.           DVT prophylaxis:  enoxaparin (LOVENOX) injection 40 mg Start: 12/18/23 1000 SCDs Start: 12/17/23 1932 Place TED hose Start: 12/17/23 1932  Code Status: Full code Family Communication: Updated patient's wife and daughter over the phone on 2/15.  None at bedside today Level of care: Telemetry Medical Status is: Inpatient Remains inpatient appropriate because: Aspiration pneumonia, acute CVA   Final disposition: CIR Consultants:  Neurology  55 minutes with more than 50% spent in reviewing records, counseling patient/family and coordinating care.   Sch Meds:  Scheduled Meds:  amLODipine  10 mg Oral Daily   aspirin EC  81 mg Oral Daily   atorvastatin  80 mg Oral q1800   clopidogrel  75 mg Oral Daily   DULoxetine  60 mg Oral Daily   enoxaparin (LOVENOX) injection  40 mg Subcutaneous Q24H   famotidine  20 mg Oral BID   fluticasone  1 spray Each Nare Daily   lisinopril  20 mg Oral Daily   potassium chloride  40 mEq Oral Q4H   sodium chloride flush  3 mL Intravenous Q12H   sodium chloride flush  3 mL Intravenous Q12H   Continuous Infusions:  cefTRIAXone (ROCEPHIN)  IV 2 g (12/19/23 2048)   metronidazole 500 mg (12/19/23 2047)   PRN Meds:.acetaminophen **OR** acetaminophen, diclofenac Sodium, hydrALAZINE, HYDROcodone-acetaminophen, ondansetron **OR** ondansetron (ZOFRAN) IV, sodium chloride flush  Antimicrobials: Anti-infectives (From admission, onward)    Start     Dose/Rate Route Frequency Ordered Stop   12/17/23 2100  cefTRIAXone (ROCEPHIN) 2 g in sodium chloride 0.9 % 100 mL IVPB        2 g 200 mL/hr over  30 Minutes Intravenous Every 24 hours 12/17/23 2005 12/24/23 2059   12/17/23 2000  metroNIDAZOLE (FLAGYL) IVPB 500 mg        500 mg 100 mL/hr over 60 Minutes Intravenous Every 12 hours 12/17/23 1940     12/17/23 1945  levofloxacin (LEVAQUIN) IVPB 750 mg  Status:  Discontinued        750 mg 100 mL/hr over 90 Minutes Intravenous Every 48 hours 12/17/23 1940 12/17/23 1941   12/17/23 1945  levofloxacin (LEVAQUIN) IVPB 750 mg  Status:  Discontinued        750 mg 100 mL/hr over 90 Minutes Intravenous Every 24 hours 12/17/23 1941 12/17/23 2005   12/17/23 1915  levofloxacin (LEVAQUIN) IVPB 750 mg        750 mg 100 mL/hr over 90 Minutes Intravenous  Once 12/17/23 1909 12/18/23 0056        I have personally reviewed the following labs and images: CBC: Recent Labs  Lab 12/17/23 1741 12/18/23 1133 12/19/23 1044 12/20/23 0716  WBC 14.3* 7.8 6.7 6.5  NEUTROABS 10.8*  --   --   --   HGB 14.1 12.4* 13.2 13.1  HCT 41.1 35.7* 37.3* 37.5*  MCV  87.1 86.7 85.4 85.4  PLT 248 209 258 242   BMP &GFR Recent Labs  Lab 12/17/23 1741 12/18/23 1133 12/19/23 1044 12/20/23 0716  NA 138 138 136 136  K 3.0* 2.8* 2.9* 3.0*  CL 96* 98 100 99  CO2 30 27 27 26   GLUCOSE 81 122* 97 87  BUN 8 6* <5* <5*  CREATININE 0.65 0.65 0.68 0.57*  CALCIUM 8.9 8.5* 8.5* 8.4*  MG  --   --  1.8 2.2  PHOS  --   --   --  3.5   Estimated Creatinine Clearance: 97.6 mL/min (A) (by C-G formula based on SCr of 0.57 mg/dL (L)). Liver & Pancreas: Recent Labs  Lab 12/17/23 1741 12/18/23 1133 12/20/23 0716  AST 20 20  --   ALT 12 12  --   ALKPHOS 84 67  --   BILITOT 1.3* 1.1  --   PROT 6.5 5.7*  --   ALBUMIN 2.9* 2.5* 2.7*   No results for input(s): "LIPASE", "AMYLASE" in the last 168 hours. No results for input(s): "AMMONIA" in the last 168 hours. Diabetic: Recent Labs    12/18/23 1134  HGBA1C 5.1   No results for input(s): "GLUCAP" in the last 168 hours. Cardiac Enzymes: No results for input(s):  "CKTOTAL", "CKMB", "CKMBINDEX", "TROPONINI" in the last 168 hours. No results for input(s): "PROBNP" in the last 8760 hours. Coagulation Profile: Recent Labs  Lab 12/17/23 1741  INR 1.2   Thyroid Function Tests: No results for input(s): "TSH", "T4TOTAL", "FREET4", "T3FREE", "THYROIDAB" in the last 72 hours. Lipid Profile: Recent Labs    12/18/23 1133  CHOL 85  HDL 21*  LDLCALC 45  TRIG 94  CHOLHDL 4.0   Anemia Panel: No results for input(s): "VITAMINB12", "FOLATE", "FERRITIN", "TIBC", "IRON", "RETICCTPCT" in the last 72 hours. Urine analysis:    Component Value Date/Time   COLORURINE YELLOW 12/17/2023 1144   APPEARANCEUR CLEAR 12/17/2023 1144   LABSPEC 1.023 12/17/2023 1144   PHURINE 7.0 12/17/2023 1144   GLUCOSEU NEGATIVE 12/17/2023 1144   HGBUR NEGATIVE 12/17/2023 1144   BILIRUBINUR NEGATIVE 12/17/2023 1144   KETONESUR NEGATIVE 12/17/2023 1144   PROTEINUR NEGATIVE 12/17/2023 1144   NITRITE NEGATIVE 12/17/2023 1144   LEUKOCYTESUR NEGATIVE 12/17/2023 1144   Sepsis Labs: Invalid input(s): "PROCALCITONIN", "LACTICIDVEN"  Microbiology: Recent Results (from the past 240 hours)  Resp panel by RT-PCR (RSV, Flu A&B, Covid) Anterior Nasal Swab     Status: None   Collection Time: 12/17/23  4:04 PM   Specimen: Anterior Nasal Swab  Result Value Ref Range Status   SARS Coronavirus 2 by RT PCR NEGATIVE NEGATIVE Final   Influenza A by PCR NEGATIVE NEGATIVE Final   Influenza B by PCR NEGATIVE NEGATIVE Final    Comment: (NOTE) The Xpert Xpress SARS-CoV-2/FLU/RSV plus assay is intended as an aid in the diagnosis of influenza from Nasopharyngeal swab specimens and should not be used as a sole basis for treatment. Nasal washings and aspirates are unacceptable for Xpert Xpress SARS-CoV-2/FLU/RSV testing.  Fact Sheet for Patients: BloggerCourse.com  Fact Sheet for Healthcare Providers: SeriousBroker.it  This test is not yet  approved or cleared by the [place] FDA and has been authorized for detection and/or diagnosis of SARS-CoV-2 by FDA under an Emergency Use Authorization (EUA). This EUA will remain in effect (meaning this test can be used) for the duration of the COVID-19 declaration under Section 564(b)(1) of the Act, 21 U.S.C. section 360bbb-3(b)(1), unless the authorization is terminated or revoked.  Resp Syncytial Virus by PCR NEGATIVE NEGATIVE Final    Comment: (NOTE) Fact Sheet for Patients: BloggerCourse.com  Fact Sheet for Healthcare Providers: SeriousBroker.it  This test is not yet approved or cleared by the [place] FDA and has been authorized for detection and/or diagnosis of SARS-CoV-2 by FDA under an Emergency Use Authorization (EUA). This EUA will remain in effect (meaning this test can be used) for the duration of the COVID-19 declaration under Section 564(b)(1) of the Act, 21 U.S.C. section 360bbb-3(b)(1), unless the authorization is terminated or revoked.  Performed at [facility], [address] [place], [place] [address]   Culture, blood (Routine X 2) w Reflex to ID Panel     Status: None (Preliminary result)   Collection Time: 12/18/23 11:24 AM   Specimen: BLOOD LEFT ARM  Result Value Ref Range Status   Specimen Description BLOOD LEFT ARM  Final   Special Requests   Final    AEROBIC BOTTLE ONLY Blood Culture results may not be optimal due to an inadequate volume of blood received in culture bottles   Culture   Final    NO GROWTH 2 DAYS Performed at [facility], [address] [place], [place] [address]    Report Status PENDING  Incomplete  Culture, blood (Routine X 2) w Reflex to ID Panel     Status: None (Preliminary result)   Collection Time: 12/18/23 11:33 AM   Specimen: BLOOD LEFT ARM  Result Value Ref Range Status   Specimen Description BLOOD LEFT ARM  Final   Special Requests   Final     BOTTLES DRAWN AEROBIC AND ANAEROBIC Blood Culture results may not be optimal due to an inadequate volume of blood received in culture bottles   Culture   Final    NO GROWTH 2 DAYS Performed at [facility], [address] [place], [place] [address]    Report Status PENDING  Incomplete    Radiology Studies: No results found.     Vidit Boissonneault T. Kamon Fahr [facility]  If 7PM-7AM, please contact night-coverage [email or web address] 12/20/2023, 10:48 AM

## 2023-12-21 ENCOUNTER — Inpatient Hospital Stay (HOSPITAL_COMMUNITY): Payer: Medicaid Other

## 2023-12-21 DIAGNOSIS — I10 Essential (primary) hypertension: Secondary | ICD-10-CM | POA: Diagnosis not present

## 2023-12-21 DIAGNOSIS — J690 Pneumonitis due to inhalation of food and vomit: Secondary | ICD-10-CM | POA: Diagnosis not present

## 2023-12-21 DIAGNOSIS — E440 Moderate protein-calorie malnutrition: Secondary | ICD-10-CM | POA: Insufficient documentation

## 2023-12-21 DIAGNOSIS — I6930 Unspecified sequelae of cerebral infarction: Secondary | ICD-10-CM | POA: Diagnosis not present

## 2023-12-21 DIAGNOSIS — W19XXXA Unspecified fall, initial encounter: Secondary | ICD-10-CM | POA: Diagnosis not present

## 2023-12-21 LAB — RENAL FUNCTION PANEL
Albumin: 2.7 g/dL — ABNORMAL LOW (ref 3.5–5.0)
Anion gap: 10 (ref 5–15)
BUN: 6 mg/dL — ABNORMAL LOW (ref 8–23)
CO2: 25 mmol/L (ref 22–32)
Calcium: 8.5 mg/dL — ABNORMAL LOW (ref 8.9–10.3)
Chloride: 101 mmol/L (ref 98–111)
Creatinine, Ser: 0.52 mg/dL — ABNORMAL LOW (ref 0.61–1.24)
GFR, Estimated: >60 mL/min (ref >60)
Glucose, Bld: 83 mg/dL (ref 70–99)
Phosphorus: 3.4 mg/dL (ref 2.5–4.6)
Potassium: 3.5 mmol/L (ref 3.5–5.1)
Sodium: 136 mmol/L (ref 135–145)

## 2023-12-21 MED ORDER — POTASSIUM CHLORIDE 20 MEQ PO PACK
40.0000 meq | PACK | Freq: Once | ORAL | Status: AC
Start: 2023-12-21 — End: 2023-12-21
  Administered 2023-12-21: 40 meq via ORAL
  Filled 2023-12-21: qty 2

## 2023-12-21 MED ORDER — METRONIDAZOLE 500 MG/100ML IV SOLN
500.0000 mg | Freq: Two times a day (BID) | INTRAVENOUS | Status: AC
  Administered 2023-12-21 (×2): 500 mg via INTRAVENOUS
  Filled 2023-12-21 (×2): qty 100

## 2023-12-21 MED ORDER — ADULT MULTIVITAMIN W/MINERALS CH
1.0000 | ORAL_TABLET | Freq: Every day | ORAL | Status: DC
  Administered 2023-12-21 – 2023-12-24 (×4): 1 via ORAL
  Filled 2023-12-21 (×4): qty 1

## 2023-12-21 MED ORDER — SODIUM CHLORIDE 0.9 % IV SOLN
2.0000 g | INTRAVENOUS | Status: AC
  Administered 2023-12-21: 2 g via INTRAVENOUS
  Filled 2023-12-21: qty 20

## 2023-12-21 NOTE — Plan of Care (Signed)

## 2023-12-21 NOTE — TOC Progression Note (Signed)
Transition of Care [facility]) - Progression Note    Patient Details  Name: Hector Booker MRN: [id number] Date of Birth: [birth year]  [facility] CM/SW Contact  Janae Bridgeman, RN Phone Number: 12/21/2023, 2:41 PM  Clinical Narrative:    CM spoke with CIR and patient was declined for inpatient rehabilitation.  I spoke with [place], MSW and patient was agreeable for SNF placement - patient was faxed out in the hub for possible bed offers.  MSW with [facility] Team will follow up with the patient.        Expected Discharge Plan and Services                                               Social Determinants of Health (SDOH) Interventions SDOH Screenings   Food Insecurity: Low Risk  (11/13/2023)   Received from [facility]  Housing: Low Risk  (11/13/2023)   Received from [facility]  Transportation Needs: No Transportation Needs (11/13/2023)   Received from [facility]  Utilities: Low Risk  (11/13/2023)   Received from [facility]  Tobacco Use: High Risk (12/17/2023)    Readmission Risk Interventions     No data to display

## 2023-12-21 NOTE — Evaluation (Signed)
Modified Barium Swallow Study  Patient Details  Name: Hector Booker MRN: [id number] Date of Birth: [birth year]  Today's Date: 12/21/2023  Modified Barium Swallow completed.  Full report located under Chart Review in the Imaging Section.  History of Present Illness Pt is a 63 y/o M presenting to ED with weakness, slurred speech, and trouble swallowing after sustaining fall at home. MRI revealed acute infarcts in the left pons and high left frontal white matter. CXR concerning for RLL PNA. CTA showed asymmetric fullness of the R vocal cord. 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   Clinical Impression Pt has an oropharyngeal dysphagia with incomplete mastication, slow posterior transit, but minimal oral or pharyngeal residue. He also has incomplete laryngeal vestibule closure, and when combined with mistiming of the bolus, this leads to aspiration with thin and nectar thick liquids that at times is transient, but inconsistently elicits a cough (PAS 6-8). Boluses also tend to spill over his epiglottis before the swallow, and a chin tuck helps to allow more of the bolus to enter the valleculae and stay out of the airway with nectar thick liquids. Aspiration is not prevented with thin liquids despite attempts at strategies. He has no aspiration with thicker consistencies, but there is some penetration with purees that does clear on subsequent swallows. Given pt's current mentation, recommend starting with Dys 1 diet and honey thick liquids; however, if he can consistently remember to use a chin tuck, could advance clinically to nectar thick.   DIGEST Swallow Severity Rating*  Safety: 3  Efficiency: 0  Overall Pharyngeal Swallow Severity: 3 1: mild; 2: moderate; 3: severe; 4: profound  *The Dynamic Imaging Grade of Swallowing Toxicity is standardized for the head and neck cancer population, however, demonstrates promising clinical  applications across populations to standardize the clinical rating of pharyngeal swallow safety and severity.    Factors that may increase risk of adverse event in presence of aspiration [employer]ye Oaks [employer] Clearance Coots 2021): Respiratory or GI disease;Reduced cognitive function;Limited mobility;Weak cough;Aspiration of thick, dense, and/or acidic materials  Swallow Evaluation Recommendations Recommendations: PO diet PO Diet Recommendation: Dysphagia 1 (Pureed);Moderately thick liquids (Level 3, honey thick) Liquid Administration via: Cup;No straw Medication Administration: Whole meds with puree (crush larger pills) Supervision: Patient able to self-feed;Full supervision/cueing for swallowing strategies Swallowing strategies  : Minimize environmental distractions;Slow rate;Small bites/sips Postural changes: Position pt fully upright for meals;Stay upright 30-60 min after meals Oral care recommendations: Oral care BID (2x/day) Caregiver Recommendations: Have oral suction available;Avoid jello, ice cream, thin soups, popsicles;Remove water pitcher      Mahala Menghini., M.A. CCC-SLP Acute Rehabilitation Services Office [phone]  Secure chat preferred  12/21/2023,12:28 PM

## 2023-12-21 NOTE — Progress Notes (Signed)
PROGRESS NOTE  Hector Booker:[id number] DOB: Mar 11, 1961   PCP: [name], MD  Patient is from: Home.  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  CXR with patchy RLL opacity/infiltrate.  X-ray of his left elbow showed irregularity at radial head concerning for nondisplaced fracture.  Patient was started on antibiotics for aspiration pneumonia.   Further workup with MRI brain showed acute left pontine and left frontal CVA.  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  TTE without significant finding.  A1c 5.1%.  LDL 45.  Patient was on Plavix at home.  Neurology recommended Plavix and aspirin.  Therapy recommended CIR. CIR following.   Subjective: Seen and examined earlier this morning.  No major events overnight of this morning.  No complaints other than chronic left knee pain.  Objective: Vitals:   12/20/23 0835 12/20/23 1602 12/20/23 2047 12/21/23 0433  BP: (!) 152/82 115/79 139/78 (!) 146/87  Pulse: 64 97 73 66  Resp: 18 18 18 18   Temp: 97.9 F (36.6 C) 98.4 F (36.9 C) 97.7 F (36.5 C) (!) 97.5 F (36.4 C)  TempSrc:   Oral Oral  SpO2: 93% 98% 97% 94%  Weight:      Height:        Examination:  GENERAL: No apparent distress.  Nontoxic. HEENT: MMM.  Vision and hearing grossly intact.  NECK: Supple.  No apparent JVD.  RESP:  No IWOB.  Fair aeration bilaterally. CVS:  RRR. Heart sounds normal.  ABD/GI/GU: BS+. Abd soft, NTND.  MSK/EXT:   Moves extremities.  Significant muscle mass and subcu fat loss. SKIN: no apparent skin lesion or wound NEURO: Awake, alert and oriented appropriately but limited insight.  Dysarthria.  Relative left-sided weakness.  PSYCH: Calm. Normal affect.   Procedures:  None  Microbiology summarized: COVID-19, influenza and RSV PCR nonreactive Blood cultures NGTD  Assessment and plan: Aspiration pneumonia: Patient with history of vocal cord anomaly and CVA.  Presents with generalized weakness.   CXR raise concern for RLL infiltrate. No report of respiratory symptoms.  On room air. -Levaquin 2/13 >> CTX and Flagyl 2/13-2/18.  -Appreciate help by SLP-dysphagia 1 diet and barium swallow test -Aspiration precaution   Mechanical fall at home: Imaging showed nondisplaced left radial head fracture.  CT head and hip x-ray without significant finding.  Ortho recommended sling for comfort and outpatient follow-up.  -Fall precaution -PT/OT recommended CIR.   Acute ischemic CVA-likely embolic source History of CVA with residual left-sided deficit Dysarthria and dysphagia-patient with underlying vocal cord anomaly.  -MRI brain showed acute left pontine and left frontal CVA.  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  TTE without significant finding.  A1c 5.1%.  LDL 45.  -Appreciate neurorecommendations -Continue Plavix and aspirin for 3 weeks, then aspirin alone -Consider 30-day cardiac monitor at discharge.  Will consult cardiology upon discharge -BP goal normotensive   Left radial head nondisplaced  fracture -Supportive care with sling as needed per [facility]. -Outpatient follow-up   Hypokalemia: Improved. -Monitor replenish as appropriate   Essential hypertension: BP elevated -Continue home lisinopril -Added amlodipine 10 mg daily here. -P.o. hydralazine as needed   Hyperlipidemia  LDL 45. -Continue Lipitor   Chronic knee osteoarthritis: No fractures -Continue pain control with Voltaren gel -PT/OT  Tobacco use disorder: Smokes about 1/4 pack a day. -Encourage cessation -Nicotine patch  Prior history of polysubstance use including cocaine: UDS positive for opiates.  Body mass index is 22.81 kg/m.           DVT prophylaxis:  enoxaparin (LOVENOX) injection 40 mg Start: 12/18/23 1000 SCDs Start: 12/17/23 1932 Place TED hose Start: 12/17/23 1932  Code Status: Full code Family Communication: None at bedside today. Level of care: Telemetry Medical Status is: Inpatient Remains inpatient appropriate because: Aspiration pneumonia, acute CVA   Final disposition: CIR Consultants:  Neurology  35 minutes with more than 50% spent in reviewing records, counseling patient/family and coordinating care.   Sch Meds:  Scheduled Meds:  amLODipine  10 mg Oral Daily   aspirin EC  81 mg Oral Daily   atorvastatin  80 mg Oral q1800   clopidogrel  75 mg Oral Daily   DULoxetine  60 mg Oral Daily   enoxaparin (LOVENOX) injection  40 mg Subcutaneous Q24H   famotidine  20 mg Oral BID   fluticasone  1 spray Each Nare Daily   lisinopril  20 mg Oral Daily   potassium chloride  40 mEq Oral Once   sodium chloride flush  3 mL Intravenous Q12H   sodium chloride flush  3 mL Intravenous Q12H   Continuous Infusions:  cefTRIAXone (ROCEPHIN)  IV 2 g (12/20/23 2202)   metronidazole 500 mg (12/20/23 2031)   PRN Meds:.acetaminophen **OR** acetaminophen, diclofenac Sodium, hydrALAZINE, HYDROcodone-acetaminophen, ondansetron **OR** ondansetron (ZOFRAN) IV, sodium chloride flush  Antimicrobials: Anti-infectives (From admission, onward)    Start     Dose/Rate Route Frequency Ordered Stop   12/17/23 2100  cefTRIAXone (ROCEPHIN) 2 g in sodium chloride 0.9 % 100 mL IVPB        2 g 200 mL/hr over 30 Minutes Intravenous Every 24 hours 12/17/23 2005 12/24/23 2059   12/17/23 2000   metroNIDAZOLE (FLAGYL) IVPB 500 mg        500 mg 100 mL/hr over 60 Minutes Intravenous Every 12 hours 12/17/23 1940     12/17/23 1945  levofloxacin (LEVAQUIN) IVPB 750 mg  Status:  Discontinued        750 mg 100 mL/hr over 90 Minutes Intravenous Every 48 hours 12/17/23 1940 12/17/23 1941   12/17/23 1945  levofloxacin (LEVAQUIN) IVPB 750 mg  Status:  Discontinued        750 mg 100 mL/hr over 90 Minutes Intravenous Every 24 hours 12/17/23 1941 12/17/23 2005   12/17/23 1915  levofloxacin (LEVAQUIN) IVPB 750 mg        750 mg 100 mL/hr over 90 Minutes Intravenous  Once 12/17/23 1909 12/18/23 0056        I have personally reviewed the following labs and images: CBC: Recent Labs  Lab 12/17/23 1741 12/18/23 1133 12/19/23 1044 12/20/23 0716  WBC 14.3* 7.8 6.7 6.5  NEUTROABS 10.8*  --   --   --   HGB 14.1 12.4* 13.2 13.1  HCT 41.1 35.7* 37.3* 37.5*  MCV 87.1 86.7 85.4 85.4  PLT 248 209 258 242   BMP &GFR Recent Labs  Lab 12/17/23 1741 12/18/23  1133 12/19/23 1044 12/20/23 0716 12/21/23 0654  NA 138 138 136 136 136  K 3.0* 2.8* 2.9* 3.0* 3.5  CL 96* 98 100 99 101  CO2 30 27 27 26 25   GLUCOSE 81 122* 97 87 83  BUN 8 6* <5* <5* 6*  CREATININE 0.65 0.65 0.68 0.57* 0.52*  CALCIUM 8.9 8.5* 8.5* 8.4* 8.5*  MG  --   --  1.8 2.2  --   PHOS  --   --   --  3.5 3.4   Estimated Creatinine Clearance: 97.6 mL/min (A) (by C-G formula based on SCr of 0.52 mg/dL (L)). Liver & Pancreas: Recent Labs  Lab 12/17/23 1741 12/18/23 1133 12/20/23 0716 12/21/23 0654  AST 20 20  --   --   ALT 12 12  --   --   ALKPHOS 84 67  --   --   BILITOT 1.3* 1.1  --   --   PROT 6.5 5.7*  --   --   ALBUMIN 2.9* 2.5* 2.7* 2.7*   No results for input(s): "LIPASE", "AMYLASE" in the last 168 hours. No results for input(s): "AMMONIA" in the last 168 hours. Diabetic: Recent Labs    12/18/23 1134  HGBA1C 5.1   No results for input(s): "GLUCAP" in the last 168 hours. Cardiac Enzymes: No results for  input(s): "CKTOTAL", "CKMB", "CKMBINDEX", "TROPONINI" in the last 168 hours. No results for input(s): "PROBNP" in the last 8760 hours. Coagulation Profile: Recent Labs  Lab 12/17/23 1741  INR 1.2   Thyroid Function Tests: No results for input(s): "TSH", "T4TOTAL", "FREET4", "T3FREE", "THYROIDAB" in the last 72 hours. Lipid Profile: Recent Labs    12/18/23 1133  CHOL 85  HDL 21*  LDLCALC 45  TRIG 94  CHOLHDL 4.0   Anemia Panel: No results for input(s): "VITAMINB12", "FOLATE", "FERRITIN", "TIBC", "IRON", "RETICCTPCT" in the last 72 hours. Urine analysis:    Component Value Date/Time   COLORURINE YELLOW 12/17/2023 1144   APPEARANCEUR CLEAR 12/17/2023 1144   LABSPEC 1.023 12/17/2023 1144   PHURINE 7.0 12/17/2023 1144   GLUCOSEU NEGATIVE 12/17/2023 1144   HGBUR NEGATIVE 12/17/2023 1144   BILIRUBINUR NEGATIVE 12/17/2023 1144   KETONESUR NEGATIVE 12/17/2023 1144   PROTEINUR NEGATIVE 12/17/2023 1144   NITRITE NEGATIVE 12/17/2023 1144   LEUKOCYTESUR NEGATIVE 12/17/2023 1144   Sepsis Labs: Invalid input(s): "PROCALCITONIN", "LACTICIDVEN"  Microbiology: Recent Results (from the past 240 hours)  Resp panel by RT-PCR (RSV, Flu A&B, Covid) Anterior Nasal Swab     Status: None   Collection Time: 12/17/23  4:04 PM   Specimen: Anterior Nasal Swab  Result Value Ref Range Status   SARS Coronavirus 2 by RT PCR NEGATIVE NEGATIVE Final   Influenza A by PCR NEGATIVE NEGATIVE Final   Influenza B by PCR NEGATIVE NEGATIVE Final    Comment: (NOTE) The Xpert Xpress SARS-CoV-2/FLU/RSV plus assay is intended as an aid in the diagnosis of influenza from Nasopharyngeal swab specimens and should not be used as a sole basis for treatment. Nasal washings and aspirates are unacceptable for Xpert Xpress SARS-CoV-2/FLU/RSV testing.  Fact Sheet for Patients: BloggerCourse.com  Fact Sheet for Healthcare Providers: SeriousBroker.it  This test is  not yet approved or cleared by the [place] FDA and has been authorized for detection and/or diagnosis of SARS-CoV-2 by FDA under an Emergency Use Authorization (EUA). This EUA will remain in effect (meaning this test can be used) for the duration of the COVID-19 declaration under Section 564(b)(1) of the Act, 21  U.S.C. section 360bbb-3(b)(1), unless the authorization is terminated or revoked.     Resp Syncytial Virus by PCR NEGATIVE NEGATIVE Final    Comment: (NOTE) Fact Sheet for Patients: BloggerCourse.com  Fact Sheet for Healthcare Providers: SeriousBroker.it  This test is not yet approved or cleared by the [place] FDA and has been authorized for detection and/or diagnosis of SARS-CoV-2 by FDA under an Emergency Use Authorization (EUA). This EUA will remain in effect (meaning this test can be used) for the duration of the COVID-19 declaration under Section 564(b)(1) of the Act, 21 U.S.C. section 360bbb-3(b)(1), unless the authorization is terminated or revoked.  Performed at [facility], [address] [place], [place] [address]   Culture, blood (Routine X 2) w Reflex to ID Panel     Status: None (Preliminary result)   Collection Time: 12/18/23 11:24 AM   Specimen: BLOOD LEFT ARM  Result Value Ref Range Status   Specimen Description BLOOD LEFT ARM  Final   Special Requests   Final    AEROBIC BOTTLE ONLY Blood Culture results may not be optimal due to an inadequate volume of blood received in culture bottles   Culture   Final    NO GROWTH 3 DAYS Performed at [facility], [address], [place], [place] [address]    Report Status PENDING  Incomplete  Culture, blood (Routine X 2) w Reflex to ID Panel     Status: None (Preliminary result)   Collection Time: 12/18/23 11:33 AM   Specimen: BLOOD LEFT ARM  Result Value Ref Range Status   Specimen Description BLOOD LEFT ARM  Final   Special Requests    Final    BOTTLES DRAWN AEROBIC AND ANAEROBIC Blood Culture results may not be optimal due to an inadequate volume of blood received in culture bottles   Culture   Final    NO GROWTH 3 DAYS Performed at [facility], [address], [place], [place] [address]    Report Status PENDING  Incomplete    Radiology Studies: No results found.     Aadhira Heffernan T. Yanique Mulvihill [facility]  If 7PM-7AM, please contact night-coverage [email or web address] 12/21/2023, 9:16 AM

## 2023-12-21 NOTE — H&P (Incomplete)
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  Per chart review patient lives alone.  1 level home with 3 steps to entry.  Reports using rollator for mobility prior to admission.  He has an ex-wife who lives a few miles away who can assist on discharge.  Presented 12/17/2023 with several day history of increasing left side weakness and slurred speech as well as recent fall landing on his left arm without loss of consciousness.  CT/MRI showed acute infarct left pons and high left frontal white matter.  Remote right posterior MCA territory and right pontine infarct.  CTA no emergent large vessel occlusion.  Occluded nondominant right vertebral artery unchanged from 2022.  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  Admission chemistries unremarkable except WBC of 14,300, potassium 3.0, chloride 96, urine drug screen pending, blood cultures no growth to date.  X-rays of left elbow showed irregularity of the radial head representing nondisplaced fracture.  Chest x-ray patchy right lower lobe opacity nonspecific in the setting of fall representing possible pneumonia.  Echocardiogram with ejection fraction of 55 to 60% no wall motion abnormalities grade 1 diastolic dysfunction.  Neurology follow-up placed on aspirin 81 mg daily and Plavix 75 mg daily for CVA prophylaxis x 3 weeks then aspirin alone.  Lovenox added for DVT prophylaxis.  Intravenous Rocephin initiated for aspiration pneumonia.  Currently on dysphagia #1 honey thick liquid diet.  In regards to patient's  nondisplaced left radial head fracture recommended conservative care sling for comfort.  Therapy evaluations completed due to patient decreased functional ability left side weakness was admitted for a comprehensive rehab program.  Review of Systems  Constitutional:  Negative for chills and fever.  HENT:  Negative for hearing loss.   Eyes:  Negative for blurred vision and double vision.  Respiratory:  Positive for cough. Negative for shortness of breath and wheezing.   Cardiovascular:  Negative for chest pain, palpitations and leg swelling.  Gastrointestinal:  Positive for constipation. Negative for heartburn, nausea and vomiting.  Genitourinary:  Positive for urgency. Negative for dysuria and hematuria.  Musculoskeletal:  Positive for falls, joint pain and myalgias.  Skin:  Negative for rash.  Neurological:  Positive for sensory change, speech change and weakness.  All other systems reviewed and are negative.  Past Medical History:  Diagnosis Date   Alcohol abuse 12/04/2016   Arthritis    Cocaine abuse (HCC) 12/02/2016   Hyperlipemia    Stroke [facility])    Past Surgical History:  Procedure Laterality Date   IR GENERIC HISTORICAL  12/02/2016   IR ANGIO VERTEBRAL SEL VERTEBRAL BILAT MOD SED 12/02/2016 Julieanne Cotton, MD [facility]INTER[facility] RAD   IR GENERIC HISTORICAL  12/02/2016   IR ANGIO INTRA EXTRACRAN SEL COM CAROTID INNOMINATE BILAT MOD SED 12/02/2016 Julieanne Cotton, MD [facility]   LOWER EXTREMITY ANGIOGRAPHY N/A 07/29/2019   Procedure: LOWER EXTREMITY ANGIOGRAPHY;  Surgeon: Chuck Hint, MD;  Location: [facility] INVASIVE CV LAB;  Service: Cardiovascular;  Laterality: N/A;   Family History  Problem Relation Age of Onset   Heart attack Father    Stroke Paternal Grandfather  Social History:  reports that he has been smoking cigars and cigarettes. He has never used smokeless tobacco. He reports current alcohol use of about 1.0 standard drink of alcohol per week. He reports that he does  not currently use drugs after having used the following drugs: Codeine and Cocaine. Allergies:  Allergies  Allergen Reactions   Penicillins Anaphylaxis    Has patient had a PCN reaction causing immediate rash, facial/tongue/throat swelling, SOB or lightheadedness with hypotension: Yes Has patient had a PCN reaction causing severe rash involving mucus membranes or skin necrosis: No Has patient had a PCN reaction that required hospitalization Yes Has patient had a PCN reaction occurring within the last 10 years: No If all of the above answers are "NO", then may proceed with Cephalosporin use.    Medications Prior to Admission  Medication Sig Dispense Refill   acetaminophen (TYLENOL) 650 MG CR tablet Take 650-1,300 mg by mouth every 8 (eight) hours as needed for pain.     atorvastatin (LIPITOR) 40 MG tablet Take 1 tablet (40 mg total) by mouth daily at 6 PM. (Patient taking differently: Take 40 mg by mouth daily.) 30 tablet 2   clopidogrel (PLAVIX) 75 MG tablet Take 1 tablet (75 mg total) by mouth daily. 90 tablet 1   diclofenac sodium (VOLTAREN) 1 % GEL Apply 2-4 g topically 4 (four) times daily as needed (for hand or knee pain).     DULoxetine (CYMBALTA) 60 MG capsule Take 60 mg by mouth daily.     famotidine (PEPCID) 20 MG tablet Take 1 tablet (20 mg total) by mouth 2 (two) times daily. 30 tablet 0   gabapentin (NEURONTIN) 300 MG capsule Take 300-600 mg by mouth See admin instructions. Take 300 mg by mouth in the morning, 300 mg in the afternoon, and 600 mg at bedtime     HYDROcodone-acetaminophen (NORCO) 10-325 MG tablet Take 1 tablet by mouth every 6 (six) hours as needed for moderate pain (pain score 4-6). Take 1 tablet by mouth every 4-5 hours     lisinopril (ZESTRIL) 20 MG tablet Take 20 mg by mouth daily.        Home: Home Living Family/patient expects to be discharged to:: Private residence Living Arrangements: Alone Available Help at Discharge: Available PRN/intermittently  (ex-wife lives a few miles away and can assist) Type of Home: Other(Comment) (camper) Home Access: Stairs to enter Secretary/administrator of Steps: 3 Entrance Stairs-Rails: Right, Left, Can reach both Home Layout: One level Bathroom Shower/Tub: Walk-in shower (walkin shower is at his wife's) Firefighter: Standard Home Equipment: Occupational hygienist (4 wheels), Shower seat, Grab bars - toilet, Grab bars - tub/shower Additional Comments: pt reprots using rollator for mobility prior to admission   Functional History: Prior Function Prior Level of Function : Independent/Modified Independent  Functional Status:  Mobility: Bed Mobility Overal bed mobility: Needs Assistance Bed Mobility: Rolling, Sidelying to Sit Rolling: Contact guard assist Sidelying to sit: Contact guard assist Supine to sit: Contact guard, HOB elevated, Used rails Sit to supine: Min assist General bed mobility comments: CGA to roll, using rail, pulling with RUE. CGA to rise slowly. Cues for technique. Transfers Overall transfer level: Needs assistance Equipment used: Rolling walker (2 wheels) Transfers: Sit to/from Stand, Bed to chair/wheelchair/BSC Sit to Stand: Min assist Bed to/from chair/wheelchair/BSC transfer type:: Step pivot Step pivot transfers: Min assist General transfer comment: Light min assist for boost to stand, slow to rise, increased flexor tone LEs. Min assist for balance with step pivot to  recliner. Ambulation/Gait Ambulation/Gait assistance: Min assist Gait Distance (Feet): 3 Feet (37ft forward and 39ft backwards) Assistive device: Rolling walker (2 wheels) Gait Pattern/deviations: Step-to pattern, Decreased step length - left, Decreased stance time - left, Decreased stride length General Gait Details: Declined, states he will try later. Gait velocity: decreased Gait velocity interpretation: <1.31 ft/sec, indicative of household ambulator    ADL: ADL Overall ADL's : Needs  assistance/impaired Eating/Feeding: Modified independent, Sitting Grooming: Wash/dry hands, Wash/dry face, Contact guard assist, Sitting Upper Body Bathing: Supervision/ safety, Sitting Lower Body Bathing: Minimal assistance, Sit to/from stand Upper Body Dressing : Minimal assistance, Sitting Lower Body Dressing: Moderate assistance, Sit to/from stand Toilet Transfer: Minimal assistance, Ambulation, Rolling walker (2 wheels), Comfort height toilet, Grab bars Toileting- Clothing Manipulation and Hygiene: Minimal assistance, Sit to/from stand Functional mobility during ADLs: Minimal assistance, Rolling walker (2 wheels) (ambulation in the room) General ADL Comments: Pt reports having to have some assist at home for bathing and dressing tasks.  He reports falling when attempting to go to the bathroom by himself with the RW.  Increased difficulty noted when attempting to donn his gripper socks.  Min assist for sitting balance while trying to reach down to the floor to complete the task.  Cognition: Cognition Orientation Level: Oriented X4 Cognition Arousal: Alert Behavior During Therapy: Flat affect  Physical Exam: Blood pressure (!) 146/87, pulse 66, temperature (!) 97.5 F (36.4 C), temperature source Oral, resp. rate 18, height 5\' 10"  (1.778 m), weight 72.1 kg, SpO2 94%. Physical Exam Neurological:     Comments: Patient is alert and makes eye contact with examiner.  Follows simple commands.  Speech is dysarthric but intelligible.  Provides name and age.  Medical history is somewhat vague.     Results for orders placed or performed during the hospital encounter of 12/17/23 (from the past 48 hours)  Rapid urine drug screen (hospital performed)     Status: Abnormal   Collection Time: 12/19/23 11:44 AM  Result Value Ref Range   Opiates POSITIVE (A) NONE DETECTED   Cocaine NONE DETECTED NONE DETECTED   Benzodiazepines NONE DETECTED NONE DETECTED   Amphetamines NONE DETECTED NONE DETECTED    Tetrahydrocannabinol NONE DETECTED NONE DETECTED   Barbiturates NONE DETECTED NONE DETECTED    Comment: (NOTE) DRUG SCREEN FOR MEDICAL PURPOSES ONLY.  IF CONFIRMATION IS NEEDED FOR ANY PURPOSE, NOTIFY LAB WITHIN 5 DAYS.  LOWEST DETECTABLE LIMITS FOR URINE DRUG SCREEN Drug Class                     Cutoff (ng/mL) Amphetamine and metabolites    1000 Barbiturate and metabolites    200 Benzodiazepine                 200 Opiates and metabolites        300 Cocaine and metabolites        300 THC                            50 Performed at [facility], [address], [place], [place] [address]   Renal function panel     Status: Abnormal   Collection Time: 12/20/23  7:16 AM  Result Value Ref Range   Sodium 136 135 - 145 mmol/L   Potassium 3.0 (L) 3.5 - 5.1 mmol/L   Chloride 99 98 - 111 mmol/L   CO2 26 22 - 32 mmol/L   Glucose, Bld 87  70 - 99 mg/dL    Comment: Glucose reference range applies only to samples taken after fasting for at least 8 hours.   BUN <5 (L) 8 - 23 mg/dL   Creatinine, Ser 4.09 (L) 0.61 - 1.24 mg/dL   Calcium 8.4 (L) 8.9 - 10.3 mg/dL   Phosphorus 3.5 2.5 - 4.6 mg/dL   Albumin 2.7 (L) 3.5 - 5.0 g/dL   GFR, Estimated >81 >19 mL/min    Comment: (NOTE) Calculated using the CKD-EPI Creatinine Equation (2021)    Anion gap 11 5 - 15    Comment: Performed at [facility], [address], [place], [place] [address]  Magnesium     Status: None   Collection Time: 12/20/23  7:16 AM  Result Value Ref Range   Magnesium 2.2 1.7 - 2.4 mg/dL    Comment: Performed at [facility], [address], [place], [place] [address]  CBC     Status: Abnormal   Collection Time: 12/20/23  7:16 AM  Result Value Ref Range   WBC 6.5 4.0 - 10.5 K/uL   RBC 4.39 4.22 - 5.81 MIL/uL   Hemoglobin 13.1 13.0 - 17.0 g/dL   HCT 30.8 (L) 65.7 - 84.6 %   MCV 85.4 80.0 - 100.0 fL   MCH 29.8 26.0 - 34.0 pg   MCHC 34.9 30.0 - 36.0 g/dL   RDW 96.2 95.2 - 84.1 %   Platelets 242  150 - 400 K/uL   nRBC 0.0 0.0 - 0.2 %    Comment: Performed at [facility], [address] [place], [place] [address]  Renal function panel     Status: Abnormal   Collection Time: 12/21/23  6:54 AM  Result Value Ref Range   Sodium 136 135 - 145 mmol/L   Potassium 3.5 3.5 - 5.1 mmol/L   Chloride 101 98 - 111 mmol/L   CO2 25 22 - 32 mmol/L   Glucose, Bld 83 70 - 99 mg/dL    Comment: Glucose reference range applies only to samples taken after fasting for at least 8 hours.   BUN 6 (L) 8 - 23 mg/dL   Creatinine, Ser 1.02 (L) 0.61 - 1.24 mg/dL   Calcium 8.5 (L) 8.9 - 10.3 mg/dL   Phosphorus 3.4 2.5 - 4.6 mg/dL   Albumin 2.7 (L) 3.5 - 5.0 g/dL   GFR, Estimated >72 >53 mL/min    Comment: (NOTE) Calculated using the CKD-EPI Creatinine Equation (2021)    Anion gap 10 5 - 15    Comment: Performed at [facility], [address], [place], [place] [address]   No results found.    Blood pressure (!) 146/87, pulse 66, temperature (!) 97.5 F (36.4 C), temperature source Oral, resp. rate 18, height 5\' 10"  (1.778 m), weight 72.1 kg, SpO2 94%.  Medical Problem List and Plan: 1. Functional deficits secondary to left pons and the high left frontal white matter infarction as well as history of remote right posterior MCA territory infarction and right pontine infarction.  Recommendations a 30-day cardiac event monitor arranged while on acute care services  -patient may *** shower  -ELOS/Goals: *** 2.  Antithrombotics: -DVT/anticoagulation:  Pharmaceutical: Lovenox  -antiplatelet therapy: Aspirin 81 mg daily and Plavix 75 mg day x 3 weeks then aspirin alone 3. Pain Management: Voltaren gel 4 times daily as needed, hydrocodone as needed 4. Mood/Behavior/Sleep: Provide emotional support  -antipsychotic agents: N/A 5. Neuropsych/cognition: This patient is capable of making  decisions on his own behalf. 6. Skin/Wound Care: Routine skin checks 7. Fluids/Electrolytes/Nutrition: Routine in  and outs with follow-up chemistries 8.  Dysphagia.  Dysphagia #1 honey thick liquids.  Follow-up speech therapy.  Monitor hydration 9.  Aspiration pneumonia.  Complete course of Rocephin 10.  Hypertension.  Lisinopril 20 mg daily, Norvasc 10 mg daily.  Monitor with increased mobility 11.  Hyperlipidemia.  Lipitor 12.  History of alcohol/tobacco/cocaine use.  Provide counseling 13.  Nondisplaced left radial head fracture.  Sling for comfort.  Follow-up outpatient.  ***  Mcarthur Rossetti Syeda Prickett, PA-C 12/21/2023

## 2023-12-21 NOTE — Progress Notes (Signed)
Physical Therapy Treatment Patient Details Name: Hector Booker MRN: [id number] DOB: 06/17/1961 Today's Date: 12/21/2023   History of Present Illness Pt is a 63 y/o M presenting to ED with weakness and slurred speech after sustaining fall at home. MRI revealed acute infarcts and found to have pneumonia. PMH includes CVA with residual left sided weakness, essential hypertension and hyperlipidemia.    PT Comments  Pt seen for PT tx with pt agreeable. Pt reports chronic BLE (L>R) knee pain throughout session which limits gait distance. Pt is able to ambulate in room with RW & CGA<>supervision with decreased gait speed & gait pattern as noted below. Pt reports he is ambulating close his baseline. Pt reports he plans to d/c home with brother who has a 1 level home with ramped entry vs home alone. Will continue to follow pt acutely to progress mobility as able.    If plan is discharge home, recommend the following: A little help with walking and/or transfers;A little help with bathing/dressing/bathroom;Assistance with cooking/housework;Direct supervision/assist for financial management;Supervision due to cognitive status;Assist for transportation;Direct supervision/assist for medications management;Help with stairs or ramp for entrance   Can travel by private vehicle        Equipment Recommendations  None recommended by PT (pt declines RW, says he has rollator at home he prefers to use)    Recommendations for Other Services       Precautions / Restrictions Precautions Precautions: Fall Precaution/Restrictions Comments: L hemiparesis Restrictions Weight Bearing Restrictions Per Provider Order: No Other Position/Activity Restrictions: LUE sling for comfort per orders     Mobility  Bed Mobility Overal bed mobility: Modified Independent Bed Mobility: Supine to Sit     Supine to sit: Modified independent (Device/Increase time), HOB elevated, Used rails (exit R side of bed)           Transfers Overall transfer level: Needs assistance Equipment used: Rolling walker (2 wheels) Transfers: Sit to/from Stand Sit to Stand: Contact guard assist, Supervision           General transfer comment: STS from EOB with RW    Ambulation/Gait Ambulation/Gait assistance: Contact guard assist, Supervision Gait Distance (Feet): 40 Feet Assistive device: Rolling walker (2 wheels) Gait Pattern/deviations: Decreased step length - left, Decreased step length - right, Decreased stride length, Decreased dorsiflexion - right, Decreased dorsiflexion - left, Narrow base of support Gait velocity: decreased     General Gait Details: Ambulates to door & back, distance limited by knee pain, pt reports this is the average amount he ambulates at home & reports no change in baseline gait   Stairs             Wheelchair Mobility     Tilt Bed    Modified Rankin (Stroke Patients Only)       Balance Overall balance assessment: Needs assistance Sitting-balance support: Feet supported, Bilateral upper extremity supported Sitting balance-Leahy Scale: Fair Sitting balance - Comments: supervision static sitting EOB   Standing balance support: Bilateral upper extremity supported, Reliant on assistive device for balance, During functional activity Standing balance-Leahy Scale: Fair                              Musician Communication: Impaired Factors Affecting Communication: Reduced clarity of speech  Cognition Arousal: Alert Behavior During Therapy: WFL for tasks assessed/performed   PT - Cognitive impairments: No apparent impairments  PT - Cognition Comments: follows commands throughout session, recalls need/use of gait belt for home Following commands: Intact      Cueing    Exercises      General Comments        Pertinent Vitals/Pain Pain Assessment Pain Assessment: 0-10 Pain Score: 9  Pain  Location: L knee - chronic pain Pain Descriptors / Indicators: Discomfort Pain Intervention(s): Monitored during session, Limited activity within patient's tolerance, Repositioned    Home Living                          Prior Function            PT Goals (current goals can now be found in the care plan section) Acute Rehab PT Goals Patient Stated Goal: to go home PT Goal Formulation: With patient Time For Goal Achievement: 01/01/24 Potential to Achieve Goals: Good Progress towards PT goals: Progressing toward goals    Frequency    Min 1X/week      PT Plan      Co-evaluation              AM-PAC PT "6 Clicks" Mobility   Outcome Measure  Help needed turning from your back to your side while in a flat bed without using bedrails?: None Help needed moving from lying on your back to sitting on the side of a flat bed without using bedrails?: A Little Help needed moving to and from a bed to a chair (including a wheelchair)?: A Little Help needed standing up from a chair using your arms (e.g., wheelchair or bedside chair)?: A Little Help needed to walk in hospital room?: A Little Help needed climbing 3-5 steps with a railing? : A Lot 6 Click Score: 18    End of Session   Activity Tolerance: Patient limited by pain Patient left: in chair;with chair alarm set;with call bell/phone within reach Nurse Communication: Mobility status PT Visit Diagnosis: Unsteadiness on feet (R26.81);Muscle weakness (generalized) (M62.81);Hemiplegia and hemiparesis;Other abnormalities of gait and mobility (R26.89);Pain Hemiplegia - Right/Left: Left Hemiplegia - dominant/non-dominant: Non-dominant Hemiplegia - caused by: Cerebral infarction Pain - Right/Left: Left Pain - part of body: Knee     Time: 1152-1203 PT Time Calculation (min) (ACUTE ONLY): 11 min  Charges:    $Therapeutic Activity: 8-22 mins PT General Charges $$ ACUTE PT VISIT: 1 Visit                      Aleda Grana, PT, DPT 12/21/23, 12:08 PM   Sandi Mariscal 12/21/2023, 12:07 PM

## 2023-12-21 NOTE — [place] FL2 (Signed)
[place] MEDICAID FL2 LEVEL OF CARE FORM     IDENTIFICATION  Patient Name: Hector Booker Birthdate: 1961/02/25 Sex: male Admission Date (Current Location): 12/17/2023  [place] and [place] Number:  Haynes Bast [id number] R Facility and Address:  [facility], 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) 12/18/2023   Aspiration pneumonia (HCC) 12/17/2023   History of CVA with residual deficit of the left side 12/17/2023   Fall at home, initial encounter 12/17/2023   Dysphagia 12/17/2023   Nondisplaced fracture of left radial head fracture 12/17/2023   Knee osteoarthritis 12/17/2023   Dysarthria 12/17/2023   Hypokalemia 12/17/2023   Ischemic stroke (HCC) 10/31/2021   Essential hypertension 08/31/2017   History of cocaine use 08/31/2017   History of alcohol abuse 08/31/2017   Neuropathy 02/18/2017   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) 12/25/2016   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   Vertebral artery occlusion, 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   Hyperlipidemia 12/02/2016   Smoker 12/02/2016   Left pontine stroke (HCC) s/p tPA 12/01/2016    Orientation RESPIRATION BLADDER Height & Weight     Self, Time, Situation, Place  Normal Continent Weight: 72.1 kg Height:  5\' 10"  (177.8 cm)  BEHAVIORAL SYMPTOMS/MOOD NEUROLOGICAL BOWEL NUTRITION STATUS      Continent Diet (See Discharge Summary)  AMBULATORY STATUS COMMUNICATION OF NEEDS Skin   Limited Assist Verbally Normal                        Personal Care Assistance Level of Assistance  Bathing, Feeding, Dressing Bathing Assistance: Limited assistance Feeding assistance: Limited assistance Dressing Assistance: Limited assistance     Functional Limitations Info  Sight, Hearing, Speech Sight Info: Impaired Hearing Info: Impaired Speech Info: Adequate    SPECIAL CARE FACTORS FREQUENCY  PT (By licensed PT), OT (By licensed OT)     PT Frequency: 5 x per week              Contractures Contractures Info: Not present    Additional Factors Info  Code Status, Allergies, Psychotropic Code Status Info: Full code Allergies Info: Penicillin Psychotropic Info: Cymbalta         Current Medications (12/21/2023):  This is the current hospital active medication list Current Facility-Administered Medications  Medication Dose Route Frequency Provider Last Rate Last Admin   acetaminophen (TYLENOL) tablet 650 mg  650 mg Oral Q6H PRN Janalyn Shy, Subrina, MD   650 mg at 12/20/23 1143   Or   acetaminophen (TYLENOL) suppository 650 mg  650 mg Rectal Q6H PRN Sundil, Subrina, MD       amLODipine (NORVASC) tablet 10 mg  10 mg Oral Daily Candelaria Stagers T, MD   10 mg at 12/21/23 1220   aspirin EC tablet 81 mg  81 mg Oral Daily Sundil, Subrina, MD   81 mg at 12/21/23 1220   atorvastatin (LIPITOR) tablet 80 mg  80 mg Oral q1800 Tereasa Coop, MD  80 mg at 12/20/23 1836   cefTRIAXone (ROCEPHIN) 2 g in sodium chloride 0.9 % 100 mL IVPB  2 g Intravenous Q24H Almon Hercules, MD       clopidogrel (PLAVIX) tablet 75 mg  75 mg Oral Daily Sundil, Subrina, MD   75 mg at 12/21/23 1220   diclofenac Sodium (VOLTAREN) 1 % topical gel 2-4 g  2-4 g Topical QID PRN Sundil, Subrina, MD       DULoxetine (CYMBALTA) DR capsule 60 mg  60 mg Oral Daily Candelaria Stagers T, MD   60 mg at 12/21/23 1220   enoxaparin (LOVENOX) injection 40 mg  40 mg Subcutaneous Q24H Sundil, Subrina, MD   40 mg at 12/21/23 1220   famotidine (PEPCID) tablet 20 mg  20 mg  Oral BID Sundil, Subrina, MD   20 mg at 12/21/23 1220   fluticasone (FLONASE) 50 MCG/ACT nasal spray 1 spray  1 spray Each Nare Daily Pokhrel, Laxman, MD   1 spray at 12/21/23 1225   hydrALAZINE (APRESOLINE) tablet 25 mg  25 mg Oral Q6H PRN Almon Hercules, MD       HYDROcodone-acetaminophen (NORCO) 7.5-325 MG per tablet 1 tablet  1 tablet Oral Q6H PRN Janalyn Shy, Subrina, MD   1 tablet at 12/20/23 0558   lisinopril (ZESTRIL) tablet 20 mg  20 mg Oral Daily Sundil, Subrina, MD   20 mg at 12/21/23 1220   metroNIDAZOLE (FLAGYL) IVPB 500 mg  500 mg Intravenous Q12H Candelaria Stagers T, MD 100 mL/hr at 12/21/23 1254 500 mg at 12/21/23 1254   multivitamin with minerals tablet 1 tablet  1 tablet Oral Daily Candelaria Stagers T, MD       ondansetron (ZOFRAN) tablet 4 mg  4 mg Oral Q6H PRN Janalyn Shy, Subrina, MD   4 mg at 12/18/23 2346   Or   ondansetron (ZOFRAN) injection 4 mg  4 mg Intravenous Q6H PRN Sundil, Subrina, MD       sodium chloride flush (NS) 0.9 % injection 3 mL  3 mL Intravenous Q12H Sundil, Subrina, MD   3 mL at 12/21/23 1222   sodium chloride flush (NS) 0.9 % injection 3 mL  3 mL Intravenous Q12H Sundil, Subrina, MD   3 mL at 12/21/23 1221   sodium chloride flush (NS) 0.9 % injection 3 mL  3 mL Intravenous PRN Tereasa Coop, MD         Discharge Medications: Please see discharge summary for a list of discharge medications.  Relevant Imaging Results:  Relevant Lab Results:   Additional Information SS# [id number]  Janae Bridgeman, RN

## 2023-12-21 NOTE — Progress Notes (Signed)
Initial Nutrition Assessment  DOCUMENTATION CODES:   Non-severe (moderate) malnutrition in context of acute illness/injury  INTERVENTION:  Advance diet as medically applicable Magic cup TID with meals, each supplement provides 290 kcal and 9 grams of protein  Multivitamin/minerals   NUTRITION DIAGNOSIS:   Moderate Malnutrition related to acute illness as evidenced by moderate fat depletion, mild muscle depletion, energy intake < or equal to 50% for > or equal to 5 days.    GOAL:   Patient will meet greater than or equal to 90% of their needs    MONITOR:   PO intake  REASON FOR ASSESSMENT:   Consult Assessment of nutrition requirement/status  ASSESSMENT:    63 y.o. M, presented to ED from home with complaints of left arm and leg pain, difficulty swallowing.  His wife reports, speech more slurred, and he has complained of headache with occasional nausea and he has been coughing some of his food up.  Admitting with aspiration pneumonia.  PMH: HLD, HTN, prior substance abuse, stroke.  Review of EMR revealed: MBS 12/19/23 Patient was unable to clear DYS 3 peaches without prolonged mastication.  Coughing x 1. SLP recommended to continue with MD diet order of Dys 1. SLP continues to follow.  With new diet order on HTL. 14% weight loss ~ 1 year. Patient up in chair alert and orientated.  Stated that his appetite has improved in the last couple of days. He has no known food allergies. He would be interested in oral supplements.   Admit weight: 72.1 kg Weight history: 12/17/23 72.1 kg  10/30/22 84 kg  06/05/22 82.1 kg  12/06/21 78.2 kg  11/21/21 89.4 kg  11/01/21 89.4 kg  07/29/19 89.4 kg  05/04/19 94.8 kg  02/16/18 91.4 kg  08/31/17 86.9 kg      Average Meal Intake:No current documentation  Nutritionally Relevant Medications: Scheduled Meds:  amLODipine  10 mg Oral Daily   atorvastatin  80 mg Oral q1800   DULoxetine  60 mg Oral Daily    Labs Reviewed     NUTRITION - FOCUSED PHYSICAL EXAM:  Flowsheet Row Most Recent Value  Orbital Region Moderate depletion  Upper Arm Region Mild depletion  Thoracic and Lumbar Region Mild depletion  Buccal Region Mild depletion  Temple Region Moderate depletion  Clavicle Bone Region Mild depletion  Clavicle and Acromion Bone Region Mild depletion  Scapular Bone Region Unable to assess  Dorsal Hand No depletion  Patellar Region No depletion  Anterior Thigh Region No depletion  Posterior Calf Region No depletion  Edema (RD Assessment) None  Hair Reviewed  Eyes Reviewed  Mouth Reviewed  Skin Reviewed  Nails Reviewed       Diet Order:   Diet Order             DIET - DYS 1 Room service appropriate? No; Fluid consistency: Honey Thick  Diet effective now                   EDUCATION NEEDS:   Not appropriate for education at this time  Skin:  Skin Assessment: Reviewed RN Assessment  Last BM:  PTA  Height:   Ht Readings from Last 1 Encounters:  12/17/23 5\' 10"  (1.778 m)    Weight:   Wt Readings from Last 1 Encounters:  12/17/23 72.1 kg    Ideal Body Weight:     BMI:  Body mass index is 22.81 kg/m.  Estimated Nutritional Needs:   Kcal:  2200-2500 kcal  Protein:  95-115 g  Fluid:  70ml/kcal    Jamelle Haring RDN, LDN Clinical Dietitian   If unable to reach, please contact "RD Inpatient" secure chat group between 8 am-4 pm daily"

## 2023-12-21 NOTE — Clinical Note (Incomplete)
Inpatient Rehab Admissions Coordinator:    CIR following. I have so far been unable to reach Pt.'s wife to confirm caregiver support at d/c.

## 2023-12-21 NOTE — Plan of Care (Signed)
Attempted to call patient's wife and brother for update but no answer.  Did not leave voicemail.

## 2023-12-21 NOTE — Progress Notes (Signed)
Inpatient Rehab Admissions Coordinator:    I met with Pt and spoke with Pt.'s father and brother over the phone. Pt. Wants to go home with outpatient services. They confirm that pt.'s wife is estranged and while Pt.'s father and brother can assist PRN, they are not able to provide 24/7 support. Based on past therapy notes and noted changes in cognition (Pt.'s father confirms he's not as sharp as normal), I do not think he would be safe to return home now or even after a week on CIR. CIR will sign off, and will reach out to [facility] for possible SNF placement.   Megan Salon, MS, CCC-SLP Rehab Admissions Coordinator  [phone] (celll) [phone] (office)

## 2023-12-22 DIAGNOSIS — J690 Pneumonitis due to inhalation of food and vomit: Secondary | ICD-10-CM | POA: Diagnosis not present

## 2023-12-22 DIAGNOSIS — I10 Essential (primary) hypertension: Secondary | ICD-10-CM | POA: Diagnosis not present

## 2023-12-22 DIAGNOSIS — W19XXXA Unspecified fall, initial encounter: Secondary | ICD-10-CM | POA: Diagnosis not present

## 2023-12-22 DIAGNOSIS — I6930 Unspecified sequelae of cerebral infarction: Secondary | ICD-10-CM | POA: Diagnosis not present

## 2023-12-22 MED ORDER — LISINOPRIL 20 MG PO TABS
30.0000 mg | ORAL_TABLET | Freq: Every day | ORAL | Status: DC
  Administered 2023-12-22 – 2023-12-24 (×3): 30 mg via ORAL
  Filled 2023-12-22 (×3): qty 1

## 2023-12-22 NOTE — TOC Progression Note (Addendum)
Transition of Care [facility]) - Progression Note    Patient Details  Name: Hector Booker MRN: [id number] Date of Birth: December 29, 1960  Transition of Care [facility]) CM/SW Contact  Breanda Greenlaw A [place], LCSW Phone Number: 12/22/2023, 2:01 PM  Clinical Narrative:     Update 1434  CSW met with pt at bedside, he stated that he accepted bed offer, CSW informed him that insurance authorization would need to be started then pt can DC to facility.   CSW reached out to [facility] to updated on acceptance of pt bed offer. They stated they would start insurance authorization.      CSW met with pt at bedside to provide bed offer with Medicare.gov ratings. He stated that his brother would be at bedside today and would update CSW regarding decision.   CSW reached out to pt's brother, Loraine Leriche and wife Calais Sink and left voicemail with both parties with contact information to reach back out to CSW.    TOC will continue to follow.         Expected Discharge Plan and Services                                               Social Determinants of Health (SDOH) Interventions SDOH Screenings   Food Insecurity: No Food Insecurity (12/22/2023)  Housing: High Risk (12/22/2023)  Transportation Needs: No Transportation Needs (12/22/2023)  Utilities: Not At Risk (12/22/2023)  Tobacco Use: High Risk (12/17/2023)    Readmission Risk Interventions     No data to display

## 2023-12-22 NOTE — Progress Notes (Signed)
PROGRESS NOTE  Hector Booker BJY:[id number] DOB: Dec 19, 1960   PCP: [name], MD  Patient is from: Home.  DOA: 12/17/2023 LOS: 5  Chief complaints Chief Complaint  Patient presents with   Slurred Speech   Weakness     Brief Narrative / Interim history: 63 year old M with PMH of left pontine CVA s/p tPA with residual left-sided weakness, right VA occlusion, vocal cord anomaly, HTN, HLD, prior alcohol, tobacco and cocaine use presenting with generalized weakness, slurred speech, dysphagia and fall at home, and admitted with working diagnosis of aspiration pneumonia.  CXR with patchy RLL opacity/infiltrate.  X-ray of his left elbow showed irregularity at radial head concerning for nondisplaced fracture.  Patient was started on antibiotics for aspiration pneumonia.   Further workup with MRI brain showed acute left pontine and left frontal CVA.  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  TTE without significant finding.  A1c 5.1%.  LDL 45.  Patient was on Plavix at home.  Neurology recommended Plavix and aspirin.  Therapy recommended CIR.  However, CIR recommended SNF since patient does not have 24/7 support at home.  TOC following.  Medically stable for discharge.   Subjective: Seen and examined earlier this morning.  No major events overnight of this morning.  Sitting on the edge of the bed eating breakfast.  Objective: Vitals:   12/21/23 2022 12/22/23 0458 12/22/23 0815 12/22/23 1234  BP: (!) 159/93 (!) 147/115 (!) 123/91 (!) 148/94  Pulse: 70 85 71 78  Resp: 16 18 17 17   Temp: (!) 97.3 F (36.3 C) 98.4 F (36.9 C) (!) 97.4 F (36.3 C)   TempSrc: Oral Oral Oral   SpO2: 96% 96% 100% 98%  Weight:      Height:        Examination:  GENERAL: No apparent distress.  Nontoxic. HEENT: MMM.  Vision and hearing grossly intact.  NECK: Supple.  No apparent JVD.  RESP:   No IWOB.  Fair aeration bilaterally. CVS:  RRR. Heart sounds normal.  ABD/GI/GU: BS+. Abd soft, NTND.  MSK/EXT:  Moves extremities.  Significant muscle mass and subcu fat loss. SKIN: no apparent skin lesion or wound NEURO: Awake, alert and oriented appropriately but limited insight.  Dysarthria.  Relative LUE weakness. PSYCH: Calm. Normal affect.   Procedures:  None  Microbiology summarized: COVID-19, influenza and RSV PCR nonreactive Blood cultures NGTD  Assessment and plan: Aspiration pneumonia: Patient with history of vocal cord anomaly and CVA.  Presents with generalized weakness.   CXR raise concern for RLL infiltrate. No report of respiratory symptoms.  On room air. -Levaquin 2/13 >> CTX and Flagyl 2/13-2/18.  -Appreciate help by SLP-dysphagia 1 diet and barium swallow test -Aspiration precaution   Mechanical fall at home: Imaging showed nondisplaced left radial head fracture.  CT head and hip x-ray without significant finding.  Ortho recommended sling for comfort and outpatient follow-up.  -Fall precaution -Continue PT/OT.   Acute ischemic CVA-likely embolic source History of CVA with residual left-sided deficit Dysarthria and dysphagia-patient with underlying vocal cord anomaly.  -MRI brain showed acute left pontine and left frontal CVA.  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  TTE without significant finding.  A1c 5.1%.  LDL 45.  -Appreciate neurorecommendations -Continue Plavix and aspirin for 3 weeks, then aspirin alone -Consider 30-day cardiac  monitor at discharge.  Will consult cardiology upon discharge -BP goal normotensive.  Titrate meds as appropriate.   Left radial head nondisplaced fracture -Supportive care with sling as needed per [facility]. -Outpatient follow-up   Hypokalemia: Improved. -Monitor replenish as appropriate   Essential hypertension:  BP elevated -Increase lisinopril to 30 mg daily -Continue amlodipine 10 mg daily -P.o. hydralazine as needed   Hyperlipidemia LDL 45. -Continue Lipitor   Chronic knee osteoarthritis: No fractures -Continue pain control with Voltaren gel -PT/OT  Tobacco use disorder: Smokes about 1/4 pack a day. -Encourage cessation -Nicotine patch  Prior history of polysubstance use including cocaine: UDS positive for opiates.  Moderate malnutrition Body mass index is 22.81 kg/m. Nutrition Problem: Moderate Malnutrition Etiology: acute illness Signs/Symptoms: moderate fat depletion, mild muscle depletion, energy intake < or equal to 50% for > or equal to 5 days Interventions: Ensure Enlive (each supplement provides 350kcal and 20 grams of protein), MVI   DVT prophylaxis:  enoxaparin (LOVENOX) injection 40 mg Start: 12/18/23 1000 SCDs Start: 12/17/23 1932 Place TED hose Start: 12/17/23 1932  Code Status: Full code Family Communication: None at bedside today. Level of care: Telemetry Medical Status is: Inpatient Remains inpatient appropriate because: Aspiration pneumonia, acute CVA   Final disposition: CIR Consultants:  Neurology-signed off.  35 minutes with more than 50% spent in reviewing records, counseling patient/family and coordinating care.   Sch Meds:  Scheduled Meds:  amLODipine  10 mg Oral Daily   aspirin EC  81 mg Oral Daily   atorvastatin  80 mg Oral q1800   clopidogrel  75 mg Oral Daily   DULoxetine  60 mg Oral Daily   enoxaparin (LOVENOX) injection  40 mg Subcutaneous Q24H   famotidine  20 mg Oral BID   fluticasone  1 spray Each Nare Daily   lisinopril  30 mg Oral Daily   multivitamin with minerals  1 tablet Oral Daily   sodium chloride flush  3 mL Intravenous Q12H   sodium chloride flush  3 mL Intravenous Q12H   Continuous Infusions:   PRN Meds:.acetaminophen **OR** acetaminophen, diclofenac Sodium, hydrALAZINE, HYDROcodone-acetaminophen, ondansetron **OR**  ondansetron (ZOFRAN) IV, sodium chloride flush  Antimicrobials: Anti-infectives (From admission, onward)    Start     Dose/Rate Route Frequency Ordered Stop   12/21/23 2100  cefTRIAXone (ROCEPHIN) 2 g in sodium chloride 0.9 % 100 mL IVPB        2 g 200 mL/hr over 30 Minutes Intravenous Every 24 hours 12/21/23 0918 12/21/23 2059   12/21/23 1000  metroNIDAZOLE (FLAGYL) IVPB 500 mg        500 mg 100 mL/hr over 60 Minutes Intravenous Every 12 hours 12/21/23 0918 12/21/23 2248   12/17/23 2100  cefTRIAXone (ROCEPHIN) 2 g in sodium chloride 0.9 % 100 mL IVPB  Status:  Discontinued        2 g 200 mL/hr over 30 Minutes Intravenous Every 24 hours 12/17/23 2005 12/21/23 0918   12/17/23 2000  metroNIDAZOLE (FLAGYL) IVPB 500 mg  Status:  Discontinued        500 mg 100 mL/hr over 60 Minutes Intravenous Every 12 hours 12/17/23 1940 12/21/23 0918   12/17/23 1945  levofloxacin (LEVAQUIN) IVPB 750 mg  Status:  Discontinued        750 mg 100 mL/hr over 90 Minutes Intravenous Every 48 hours 12/17/23 1940 12/17/23 1941   12/17/23 1945  levofloxacin (LEVAQUIN) IVPB 750 mg  Status:  Discontinued        750 mg 100  mL/hr over 90 Minutes Intravenous Every 24 hours 12/17/23 1941 12/17/23 2005   12/17/23 1915  levofloxacin (LEVAQUIN) IVPB 750 mg        750 mg 100 mL/hr over 90 Minutes Intravenous  Once 12/17/23 1909 12/18/23 0056        I have personally reviewed the following labs and images: CBC: Recent Labs  Lab 12/17/23 1741 12/18/23 1133 12/19/23 1044 12/20/23 0716  WBC 14.3* 7.8 6.7 6.5  NEUTROABS 10.8*  --   --   --   HGB 14.1 12.4* 13.2 13.1  HCT 41.1 35.7* 37.3* 37.5*  MCV 87.1 86.7 85.4 85.4  PLT 248 209 258 242   BMP &GFR Recent Labs  Lab 12/17/23 1741 12/18/23 1133 12/19/23 1044 12/20/23 0716 12/21/23 0654  NA 138 138 136 136 136  K 3.0* 2.8* 2.9* 3.0* 3.5  CL 96* 98 100 99 101  CO2 30 27 27 26 25   GLUCOSE 81 122* 97 87 83  BUN 8 6* <5* <5* 6*  CREATININE 0.65 0.65 0.68  0.57* 0.52*  CALCIUM 8.9 8.5* 8.5* 8.4* 8.5*  MG  --   --  1.8 2.2  --   PHOS  --   --   --  3.5 3.4   Estimated Creatinine Clearance: 97.6 mL/min (A) (by C-G formula based on SCr of 0.52 mg/dL (L)). Liver & Pancreas: Recent Labs  Lab 12/17/23 1741 12/18/23 1133 12/20/23 0716 12/21/23 0654  AST 20 20  --   --   ALT 12 12  --   --   ALKPHOS 84 67  --   --   BILITOT 1.3* 1.1  --   --   PROT 6.5 5.7*  --   --   ALBUMIN 2.9* 2.5* 2.7* 2.7*   No results for input(s): "LIPASE", "AMYLASE" in the last 168 hours. No results for input(s): "AMMONIA" in the last 168 hours. Diabetic: No results for input(s): "HGBA1C" in the last 72 hours.  No results for input(s): "GLUCAP" in the last 168 hours. Cardiac Enzymes: No results for input(s): "CKTOTAL", "CKMB", "CKMBINDEX", "TROPONINI" in the last 168 hours. No results for input(s): "PROBNP" in the last 8760 hours. Coagulation Profile: Recent Labs  Lab 12/17/23 1741  INR 1.2   Thyroid Function Tests: No results for input(s): "TSH", "T4TOTAL", "FREET4", "T3FREE", "THYROIDAB" in the last 72 hours. Lipid Profile: No results for input(s): "CHOL", "HDL", "LDLCALC", "TRIG", "CHOLHDL", "LDLDIRECT" in the last 72 hours.  Anemia Panel: No results for input(s): "VITAMINB12", "FOLATE", "FERRITIN", "TIBC", "IRON", "RETICCTPCT" in the last 72 hours. Urine analysis:    Component Value Date/Time   COLORURINE YELLOW 12/17/2023 1144   APPEARANCEUR CLEAR 12/17/2023 1144   LABSPEC 1.023 12/17/2023 1144   PHURINE 7.0 12/17/2023 1144   GLUCOSEU NEGATIVE 12/17/2023 1144   HGBUR NEGATIVE 12/17/2023 1144   BILIRUBINUR NEGATIVE 12/17/2023 1144   KETONESUR NEGATIVE 12/17/2023 1144   PROTEINUR NEGATIVE 12/17/2023 1144   NITRITE NEGATIVE 12/17/2023 1144   LEUKOCYTESUR NEGATIVE 12/17/2023 1144   Sepsis Labs: Invalid input(s): "PROCALCITONIN", "LACTICIDVEN"  Microbiology: Recent Results (from the past 240 hours)  Resp panel by RT-PCR (RSV, Flu A&B,  Covid) Anterior Nasal Swab     Status: None   Collection Time: 12/17/23  4:04 PM   Specimen: Anterior Nasal Swab  Result Value Ref Range Status   SARS Coronavirus 2 by RT PCR NEGATIVE NEGATIVE Final   Influenza A by PCR NEGATIVE NEGATIVE Final   Influenza B by PCR NEGATIVE NEGATIVE Final  Comment: (NOTE) The Xpert Xpress SARS-CoV-2/FLU/RSV plus assay is intended as an aid in the diagnosis of influenza from Nasopharyngeal swab specimens and should not be used as a sole basis for treatment. Nasal washings and aspirates are unacceptable for Xpert Xpress SARS-CoV-2/FLU/RSV testing.  Fact Sheet for Patients: BloggerCourse.com  Fact Sheet for Healthcare Providers: SeriousBroker.it  This test is not yet approved or cleared by the [place] FDA and has been authorized for detection and/or diagnosis of SARS-CoV-2 by FDA under an Emergency Use Authorization (EUA). This EUA will remain in effect (meaning this test can be used) for the duration of the COVID-19 declaration under Section 564(b)(1) of the Act, 21 U.S.C. section 360bbb-3(b)(1), unless the authorization is terminated or revoked.     Resp Syncytial Virus by PCR NEGATIVE NEGATIVE Final    Comment: (NOTE) Fact Sheet for Patients: BloggerCourse.com  Fact Sheet for Healthcare Providers: SeriousBroker.it  This test is not yet approved or cleared by the [place] FDA and has been authorized for detection and/or diagnosis of SARS-CoV-2 by FDA under an Emergency Use Authorization (EUA). This EUA will remain in effect (meaning this test can be used) for the duration of the COVID-19 declaration under Section 564(b)(1) of the Act, 21 U.S.C. section 360bbb-3(b)(1), unless the authorization is terminated or revoked.  Performed at [facility], [address] [place], [place] [address]   Culture, blood (Routine X 2) w  Reflex to ID Panel     Status: None (Preliminary result)   Collection Time: 12/18/23 11:24 AM   Specimen: BLOOD LEFT ARM  Result Value Ref Range Status   Specimen Description BLOOD LEFT ARM  Final   Special Requests   Final    AEROBIC BOTTLE ONLY Blood Culture results may not be optimal due to an inadequate volume of blood received in culture bottles   Culture   Final    NO GROWTH 4 DAYS Performed at [facility], [address], [place], [place] [address]    Report Status PENDING  Incomplete  Culture, blood (Routine X 2) w Reflex to ID Panel     Status: None (Preliminary result)   Collection Time: 12/18/23 11:33 AM   Specimen: BLOOD LEFT ARM  Result Value Ref Range Status   Specimen Description BLOOD LEFT ARM  Final   Special Requests   Final    BOTTLES DRAWN AEROBIC AND ANAEROBIC Blood Culture results may not be optimal due to an inadequate volume of blood received in culture bottles   Culture   Final    NO GROWTH 4 DAYS Performed at [facility], [address] [place], [place] [address]    Report Status PENDING  Incomplete    Radiology Studies: No results found.     Sirr Kabel T. Windsor Goeken [facility]  If 7PM-7AM, please contact night-coverage [email or web address] 12/22/2023, 12:40 PM

## 2023-12-22 NOTE — Progress Notes (Signed)
Speech Language Pathology Treatment: Dysphagia  Patient Details Name: Hector Booker MRN: [id number] DOB: [birth year] Today's Date: 12/22/2023 Time: 4098-1191 SLP Time Calculation (min) (ACUTE ONLY): 9 min  Assessment / Plan / Recommendation Clinical Impression  Pt appeared to be trying to drink honey thick liquids upon SLP arrival, partially reclined in bed and noted to be coughing. SLP assisted in repositioning but then pt refused any other POs offered - both purees and thickened liquids. He is frustrated with current diet, but has no recall of results from [facility] and limited reasoning to engage in therapeutic trials. SLP provided additional education about results from study, but he will need ongoing reinforcement. Attempted to initiate pharyngeal strengthening exercises without POs, starting with effortful swallows. Pt performed two swallows. SLP attempted to reduce distractions in his room to facilitate attention to task but pt said he would not do any more. Pt does not seem appropriate for diet advancement with use of precautions yet given concern for implementation and carryover. Will leave current diet in place (sign with precautions found in room but folded up - SLP placed over [facility]) and continue to follow as able.    HPI HPI: Pt is a 63 y/o M presenting to ED with weakness, slurred speech, and trouble swallowing after sustaining fall at home. MRI revealed acute infarcts in the left pons and high left frontal white matter. CXR concerning for RLL PNA. CTA showed asymmetric fullness of the R vocal cord. 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      SLP Plan  Continue with current plan of care;MBS      Recommendations for follow up therapy are one component of a multi-disciplinary discharge planning process, led by the attending physician.  Recommendations may be updated based on patient status, additional functional criteria and  insurance authorization.    Recommendations  Diet recommendations: Dysphagia 1 (puree);Honey-thick liquid Liquids provided via: Cup;No straw Medication Administration: Crushed with puree Supervision: Patient able to self feed;Full supervision/cueing for compensatory strategies Compensations: Minimize environmental distractions;Slow rate;Small sips/bites Postural Changes and/or Swallow Maneuvers: Seated upright 90 degrees                  Oral care QID   Frequent or constant Supervision/Assistance Dysphagia, oropharyngeal phase (R13.12)     Continue with current plan of care;MBS     Mahala Menghini., M.A. CCC-SLP Acute Rehabilitation Services Office [phone]  Secure chat preferred   12/22/2023, 2:49 PM

## 2023-12-22 NOTE — Plan of Care (Signed)

## 2023-12-22 NOTE — Plan of Care (Incomplete)
  Problem: Education: Goal: Knowledge of General Education information will improve Description Including pain rating scale, medication(s)/side effects and non-pharmacologic comfort measures Outcome: Progressing   Problem: Health Behavior/Discharge Planning: Goal: Ability to manage health-related needs will improve Outcome: Progressing   Problem: Clinical Measurements: Goal: Will remain free from infection Outcome: Progressing   Problem: Coping: Goal: Level of anxiety will decrease Outcome: Progressing   Problem: Safety: Goal: Ability to remain free from injury will improve Outcome: Progressing   Problem: Skin Integrity: Goal: Risk for impaired skin integrity will decrease Outcome: Progressing   

## 2023-12-22 NOTE — Progress Notes (Signed)
Inpatient Rehab Admissions Coordinator:    Pt. Without 24/7 support at home, [facility] pursuing SNF. I withdrew case for insurance for CIR.   Megan Salon, MS, CCC-SLP Rehab Admissions Coordinator  [phone] (celll) [phone] (office)

## 2023-12-22 NOTE — Progress Notes (Signed)
Physical Therapy Treatment Patient Details Name: Hector Booker MRN: [id number] DOB: May 09, 1961 Today's Date: 12/22/2023   History of Present Illness Pt is a 63 y/o M presenting to ED with weakness and slurred speech after sustaining fall at home. MRI revealed acute infarcts and found to have pneumonia. PMH includes CVA with residual left sided weakness, essential hypertension and hyperlipidemia.    PT Comments  Patient demonstrated decr safety awareness related to use of rollator (which is what he owns at home). Needs cues for setting brakes prior to transfers, staying between back wheels during turns, and not "parking" rollator off to the side 4 ft before arriving at his destination. Reports he has brace for LLE but that it is at home. Pt dragging/sliding LLE during gait (despite attempts at steppage gait). Patient will benefit from continued inpatient follow up therapy, <3 hours/day     If plan is discharge home, recommend the following: A little help with walking and/or transfers;A little help with bathing/dressing/bathroom;Assistance with cooking/housework;Direct supervision/assist for financial management;Supervision due to cognitive status;Assist for transportation;Direct supervision/assist for medications management;Help with stairs or ramp for entrance   Can travel by private vehicle     Yes  Equipment Recommendations  None recommended by PT    Recommendations for Other Services       Precautions / Restrictions Precautions Precautions: Fall Precaution/Restrictions Comments: L hemiparesis Restrictions Weight Bearing Restrictions Per Provider Order: No Other Position/Activity Restrictions: LUE sling for comfort per orders     Mobility  Bed Mobility Overal bed mobility: Modified Independent Bed Mobility: Supine to Sit     Supine to sit: Modified independent (Device/Increase time), HOB elevated (exit L side of bed) Sit to supine: Modified independent (Device/Increase time)         Transfers Overall transfer level: Needs assistance Equipment used: Rollator (4 wheels) Transfers: Sit to/from Stand Sit to Stand: Contact guard assist           General transfer comment: required cues for locking rollator prior to come to stand; cues to not "park" rollator 4 ft before reaching the bed; vc for locking brakes prior to uncontrolled descent to sit EOB    Ambulation/Gait Ambulation/Gait assistance: Contact guard assist Gait Distance (Feet): 80 Feet Assistive device: Rollator (4 wheels) Gait Pattern/deviations: Decreased step length - left, Decreased step length - right, Decreased stride length, Decreased dorsiflexion - right, Decreased dorsiflexion - left, Narrow base of support, Step-to pattern, Knee flexed in stance - left, Steppage Gait velocity: decreased Gait velocity interpretation: <1.8 ft/sec, indicate of risk for recurrent falls   General Gait Details: +foot drop LLE with pt demonstrating steppage gait (but does not fully clear left foot during advancing--drags/slides LLE); reports he has a brace for LLE but not here; vc during turning to stay within back wheels   Stairs             Wheelchair Mobility     Tilt Bed    Modified Rankin (Stroke Patients Only)       Balance Overall balance assessment: Needs assistance Sitting-balance support: Feet supported, Bilateral upper extremity supported Sitting balance-Leahy Scale: Fair Sitting balance - Comments: supervision static sitting EOB   Standing balance support: Bilateral upper extremity supported, Reliant on assistive device for balance, During functional activity Standing balance-Leahy Scale: Poor Standing balance comment: Pt needs UE support for standing balance and mobility.  Communication Communication Communication: Impaired Factors Affecting Communication: Reduced clarity of speech  Cognition Arousal: Alert Behavior During Therapy: WFL for  tasks assessed/performed   PT - Cognitive impairments: No apparent impairments, Memory, Sequencing, Safety/Judgement                       PT - Cognition Comments: required cues for safe use of rollator during transfers; tried to park rollator off to the side and turn, walk final few steps without device (despite dragging LLE without brace) Following commands: Intact      Cueing Cueing Techniques: Verbal cues  Exercises      General Comments        Pertinent Vitals/Pain Pain Assessment Pain Assessment: Faces Faces Pain Scale: No hurt Breathing: normal Negative Vocalization: none Facial Expression: smiling or inexpressive Body Language: relaxed Consolability: no need to console PAINAD Score: 0    Home Living                          Prior Function            PT Goals (current goals can now be found in the care plan section) Acute Rehab PT Goals Patient Stated Goal: to go to rehab Time For Goal Achievement: 01/01/24 Potential to Achieve Goals: Good Progress towards PT goals: Progressing toward goals    Frequency    Min 1X/week      PT Plan      Co-evaluation              AM-PAC PT "6 Clicks" Mobility   Outcome Measure  Help needed turning from your back to your side while in a flat bed without using bedrails?: None Help needed moving from lying on your back to sitting on the side of a flat bed without using bedrails?: A Little Help needed moving to and from a bed to a chair (including a wheelchair)?: A Little Help needed standing up from a chair using your arms (e.g., wheelchair or bedside chair)?: A Little Help needed to walk in hospital room?: A Little Help needed climbing 3-5 steps with a railing? : A Lot 6 Click Score: 18    End of Session Equipment Utilized During Treatment: Gait belt Activity Tolerance: Patient limited by pain Patient left: with call bell/phone within reach;in bed;with bed alarm set (reports just  recently back to bed from sitting up all morning)   PT Visit Diagnosis: Unsteadiness on feet (R26.81);Muscle weakness (generalized) (M62.81);Hemiplegia and hemiparesis;Other abnormalities of gait and mobility (R26.89);Pain Hemiplegia - Right/Left: Left Hemiplegia - dominant/non-dominant: Non-dominant Hemiplegia - caused by: Cerebral infarction Pain - Right/Left: Left Pain - part of body: Knee     Time: 6045-4098 PT Time Calculation (min) (ACUTE ONLY): 11 min  Charges:    $Gait Training: 8-22 mins PT General Charges $$ ACUTE PT VISIT: 1 Visit                      Hector Booker, PT Acute Rehabilitation Services  Office [phone]    Zena Amos 12/22/2023, 1:11 PM

## 2023-12-23 DIAGNOSIS — I6930 Unspecified sequelae of cerebral infarction: Secondary | ICD-10-CM | POA: Diagnosis not present

## 2023-12-23 DIAGNOSIS — I10 Essential (primary) hypertension: Secondary | ICD-10-CM | POA: Diagnosis not present

## 2023-12-23 DIAGNOSIS — W19XXXA Unspecified fall, initial encounter: Secondary | ICD-10-CM | POA: Diagnosis not present

## 2023-12-23 DIAGNOSIS — J690 Pneumonitis due to inhalation of food and vomit: Secondary | ICD-10-CM | POA: Diagnosis not present

## 2023-12-23 LAB — CULTURE, BLOOD (ROUTINE X 2)
Culture: NO GROWTH
Culture: NO GROWTH

## 2023-12-23 MED ORDER — LISINOPRIL 30 MG PO TABS: 30.0000 mg | ORAL_TABLET | Freq: Every day | ORAL | Status: AC

## 2023-12-23 MED ORDER — ADULT MULTIVITAMIN W/MINERALS CH: 1.0000 | ORAL_TABLET | Freq: Every day | ORAL | Status: AC

## 2023-12-23 MED ORDER — AMLODIPINE BESYLATE 10 MG PO TABS: 10.0000 mg | ORAL_TABLET | Freq: Every day | ORAL | Status: AC

## 2023-12-23 MED ORDER — CARVEDILOL 6.25 MG PO TABS: 6.2500 mg | ORAL_TABLET | Freq: Two times a day (BID) | ORAL | Status: AC

## 2023-12-23 MED ORDER — ATORVASTATIN CALCIUM 80 MG PO TABS: 80.0000 mg | ORAL_TABLET | Freq: Every day | ORAL | Status: AC

## 2023-12-23 MED ORDER — CARVEDILOL 6.25 MG PO TABS
6.2500 mg | ORAL_TABLET | Freq: Two times a day (BID) | ORAL | Status: DC
  Administered 2023-12-23 – 2023-12-24 (×3): 6.25 mg via ORAL
  Filled 2023-12-23 (×4): qty 1

## 2023-12-23 MED ORDER — ASPIRIN 81 MG PO TBEC: 81.0000 mg | DELAYED_RELEASE_TABLET | Freq: Every day | ORAL | Status: AC

## 2023-12-23 MED ORDER — ACETAMINOPHEN 325 MG PO TABS: 650.0000 mg | ORAL_TABLET | Freq: Four times a day (QID) | ORAL | Status: AC | PRN

## 2023-12-23 MED ORDER — GABAPENTIN 300 MG PO CAPS: 300.0000 mg | ORAL_CAPSULE | Freq: Three times a day (TID) | ORAL | Status: AC

## 2023-12-23 MED ORDER — HYDROCODONE-ACETAMINOPHEN 10-325 MG PO TABS: 1.0000 | ORAL_TABLET | Freq: Four times a day (QID) | ORAL | 0 refills | Status: AC | PRN

## 2023-12-23 NOTE — Progress Notes (Signed)
Upon entering room to assist patient, patient found sitting beside walker. Initially patient said he didn't fall, but patient later stated that he slipped on his pant leg while trying to leave bathroom. Per patient, he does not have any new pain(patient has chronic hand pain). Patient is alert and oriented x4 and able to move all extremities. Patient assisted back to bed. Vital signs are as follows: BP 137/82, pulse 73, temperature 98.0, and oxygen 95% on room air. Provider and charge nurse made aware of patient's fall-confirmed understanding. Per patient, he does not want family to be notified of fall. Patient educated on how and when to call for assistance. Call bell within reach and bed lowered with bed alarm initiated.

## 2023-12-23 NOTE — Progress Notes (Signed)
PROGRESS NOTE  Hector Booker:[id number] DOB: [birth year]   PCP: [name], MD  Patient is from: Home.  DOA: 12/17/2023 LOS: 6  Chief complaints Chief Complaint  Patient presents with   Slurred Speech   Weakness     Brief Narrative / Interim history: 63 year old M with PMH of left pontine CVA s/p tPA with residual left-sided weakness, right VA occlusion, vocal cord anomaly, HTN, HLD, prior alcohol, tobacco and cocaine use presenting with generalized weakness, slurred speech, dysphagia and fall at home, and admitted with working diagnosis of aspiration pneumonia.  CXR with patchy RLL opacity/infiltrate.  X-ray of his left elbow showed irregularity at radial head concerning for nondisplaced fracture.  Patient was started on antibiotics for aspiration pneumonia.   Further workup with MRI brain showed acute left pontine and left frontal CVA.  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  TTE without significant finding.  A1c 5.1%.  LDL 45.  Patient was on Plavix at home.  Neurology recommended Plavix and aspirin.  Therapy recommended CIR.  However, CIR recommended SNF since patient does not have 24/7 support at home.  TOC following.  Medically stable for discharge.   Subjective: Seen and examined earlier this morning.  No major events overnight of this morning.  No complaints other than his frustration with his dysphagia diet and long waiting on SNF.  Objective: Vitals:   12/22/23 1234 12/22/23 1543 12/22/23 2024 12/23/23 0810  BP: (!) 148/94 (!) 140/83 (!) 157/78 (!) 147/97  Pulse: 78 79 86 87  Resp: 17 18 18 18   Temp:  (!) 97.4 F (36.3 C) 98 F (36.7 C) 97.9 F (36.6 C)  TempSrc:  Oral Oral Oral  SpO2: 98% 95% 91% 96%  Weight:      Height:        Examination:  GENERAL: No apparent distress.  Nontoxic. HEENT: MMM.  Vision and hearing grossly intact.  NECK:  Supple.  No apparent JVD.  RESP:  No IWOB.  Fair aeration bilaterally. CVS:  RRR. Heart sounds normal.  ABD/GI/GU: BS+. Abd soft, NTND.  MSK/EXT:  Moves extremities.  Significant muscle mass and subcu fat loss. SKIN: no apparent skin lesion or wound NEURO: Awake, alert and oriented appropriately but limited insight.  Dysarthria.  Motor 4/5 in LUE and LLE, 5/5 in RUE and RLE. PSYCH: Calm. Normal affect.   Procedures:  None  Microbiology summarized: COVID-19, influenza and RSV PCR nonreactive Blood cultures NGTD  Assessment and plan: Aspiration pneumonia: Patient with history of vocal cord anomaly and CVA.  Presents with generalized weakness.   CXR raise concern for RLL infiltrate. No report of respiratory symptoms.  On room air. -Levaquin 2/13 >> CTX and Flagyl 2/13-2/18.  -Appreciate help by SLP-dysphagia 1 diet and barium swallow test -Aspiration precaution   Mechanical fall at home: Imaging showed nondisplaced left radial head fracture.  CT head and hip x-ray without significant finding.  Ortho recommended sling for comfort and outpatient follow-up.  -Fall precaution -Continue PT/OT.   Acute ischemic CVA-likely embolic source History of CVA with residual left-sided deficit -MRI brain showed acute left pontine and left frontal CVA.  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  TTE without significant finding.  A1c 5.1%.  LDL 45.  -Appreciate neurorecommendations -Continue Plavix and aspirin for 3 weeks, then aspirin  alone -Consider 30-day cardiac monitor at discharge.  Will consult cardiology upon discharge -BP goal normotensive.  Adjust meds as below. -Continue SLP for dysphagia  Dysarthria and dysphagia-patient with underlying vocal cord anomaly. S/p SLP evaluation and MBS. -Continue dysphagia 1 diet per SLP.   Left radial head nondisplaced fracture -Supportive care with  sling as needed per [facility]. -Outpatient follow-up   Hypokalemia: Improved. -Monitor replenish as appropriate   Essential hypertension: BP slightly elevated -Increased lisinopril to 30 mg daily on 2/18 -Continue amlodipine 10 mg daily -Add Coreg 6.25 mg twice daily -P.o. hydralazine as needed   Hyperlipidemia LDL 45. -Continue Lipitor   Chronic knee osteoarthritis: No fractures -Continue pain control with Voltaren gel -PT/OT  Tobacco use disorder: Smokes about 1/4 pack a day. -Encourage cessation -Nicotine patch  Prior history of polysubstance use including cocaine: UDS positive for opiates.  Moderate malnutrition Body mass index is 22.81 kg/m. Nutrition Problem: Moderate Malnutrition Etiology: acute illness Signs/Symptoms: moderate fat depletion, mild muscle depletion, energy intake < or equal to 50% for > or equal to 5 days Interventions: Ensure Enlive (each supplement provides 350kcal and 20 grams of protein), MVI   DVT prophylaxis:  enoxaparin (LOVENOX) injection 40 mg Start: 12/18/23 1000 SCDs Start: 12/17/23 1932 Place TED hose Start: 12/17/23 1932  Code Status: Full code Family Communication: None at bedside today. Level of care: Telemetry Medical Status is: Inpatient Remains inpatient appropriate because: Aspiration pneumonia, acute CVA   Final disposition: CIR Consultants:  Neurology-signed off.  35 minutes with more than 50% spent in reviewing records, counseling patient/family and coordinating care.   Sch Meds:  Scheduled Meds:  amLODipine  10 mg Oral Daily   aspirin EC  81 mg Oral Daily   atorvastatin  80 mg Oral q1800   carvedilol  6.25 mg Oral BID WC   clopidogrel  75 mg Oral Daily   DULoxetine  60 mg Oral Daily   enoxaparin (LOVENOX) injection  40 mg Subcutaneous Q24H   famotidine  20 mg Oral BID   fluticasone  1 spray Each Nare Daily   lisinopril  30 mg Oral Daily   multivitamin with minerals  1 tablet Oral Daily   sodium chloride  flush  3 mL Intravenous Q12H   sodium chloride flush  3 mL Intravenous Q12H   Continuous Infusions:   PRN Meds:.acetaminophen **OR** acetaminophen, diclofenac Sodium, hydrALAZINE, HYDROcodone-acetaminophen, ondansetron **OR** ondansetron (ZOFRAN) IV, sodium chloride flush  Antimicrobials: Anti-infectives (From admission, onward)    Start     Dose/Rate Route Frequency Ordered Stop   12/21/23 2100  cefTRIAXone (ROCEPHIN) 2 g in sodium chloride 0.9 % 100 mL IVPB        2 g 200 mL/hr over 30 Minutes Intravenous Every 24 hours 12/21/23 0918 12/21/23 2059   12/21/23 1000  metroNIDAZOLE (FLAGYL) IVPB 500 mg        500 mg 100 mL/hr over 60 Minutes Intravenous Every 12 hours 12/21/23 0918 12/21/23 2248   12/17/23 2100  cefTRIAXone (ROCEPHIN) 2 g in sodium chloride 0.9 % 100 mL IVPB  Status:  Discontinued        2 g 200 mL/hr over 30 Minutes Intravenous Every 24 hours 12/17/23 2005 12/21/23 0918   12/17/23 2000  metroNIDAZOLE (FLAGYL) IVPB 500 mg  Status:  Discontinued        500 mg 100 mL/hr over 60 Minutes Intravenous Every 12 hours 12/17/23 1940 12/21/23 0918   12/17/23 1945  levofloxacin (LEVAQUIN) IVPB 750 mg  Status:  Discontinued  750 mg 100 mL/hr over 90 Minutes Intravenous Every 48 hours 12/17/23 1940 12/17/23 1941   12/17/23 1945  levofloxacin (LEVAQUIN) IVPB 750 mg  Status:  Discontinued        750 mg 100 mL/hr over 90 Minutes Intravenous Every 24 hours 12/17/23 1941 12/17/23 2005   12/17/23 1915  levofloxacin (LEVAQUIN) IVPB 750 mg        750 mg 100 mL/hr over 90 Minutes Intravenous  Once 12/17/23 1909 12/18/23 0056        I have personally reviewed the following labs and images: CBC: Recent Labs  Lab 12/17/23 1741 12/18/23 1133 12/19/23 1044 12/20/23 0716  WBC 14.3* 7.8 6.7 6.5  NEUTROABS 10.8*  --   --   --   HGB 14.1 12.4* 13.2 13.1  HCT 41.1 35.7* 37.3* 37.5*  MCV 87.1 86.7 85.4 85.4  PLT 248 209 258 242   BMP &GFR Recent Labs  Lab 12/17/23 1741  12/18/23 1133 12/19/23 1044 12/20/23 0716 12/21/23 0654  NA 138 138 136 136 136  K 3.0* 2.8* 2.9* 3.0* 3.5  CL 96* 98 100 99 101  CO2 30 27 27 26 25   GLUCOSE 81 122* 97 87 83  BUN 8 6* <5* <5* 6*  CREATININE 0.65 0.65 0.68 0.57* 0.52*  CALCIUM 8.9 8.5* 8.5* 8.4* 8.5*  MG  --   --  1.8 2.2  --   PHOS  --   --   --  3.5 3.4   Estimated Creatinine Clearance: 97.6 mL/min (A) (by C-G formula based on SCr of 0.52 mg/dL (L)). Liver & Pancreas: Recent Labs  Lab 12/17/23 1741 12/18/23 1133 12/20/23 0716 12/21/23 0654  AST 20 20  --   --   ALT 12 12  --   --   ALKPHOS 84 67  --   --   BILITOT 1.3* 1.1  --   --   PROT 6.5 5.7*  --   --   ALBUMIN 2.9* 2.5* 2.7* 2.7*   No results for input(s): "LIPASE", "AMYLASE" in the last 168 hours. No results for input(s): "AMMONIA" in the last 168 hours. Diabetic: No results for input(s): "HGBA1C" in the last 72 hours.  No results for input(s): "GLUCAP" in the last 168 hours. Cardiac Enzymes: No results for input(s): "CKTOTAL", "CKMB", "CKMBINDEX", "TROPONINI" in the last 168 hours. No results for input(s): "PROBNP" in the last 8760 hours. Coagulation Profile: Recent Labs  Lab 12/17/23 1741  INR 1.2   Thyroid Function Tests: No results for input(s): "TSH", "T4TOTAL", "FREET4", "T3FREE", "THYROIDAB" in the last 72 hours. Lipid Profile: No results for input(s): "CHOL", "HDL", "LDLCALC", "TRIG", "CHOLHDL", "LDLDIRECT" in the last 72 hours.  Anemia Panel: No results for input(s): "VITAMINB12", "FOLATE", "FERRITIN", "TIBC", "IRON", "RETICCTPCT" in the last 72 hours. Urine analysis:    Component Value Date/Time   COLORURINE YELLOW 12/17/2023 1144   APPEARANCEUR CLEAR 12/17/2023 1144   LABSPEC 1.023 12/17/2023 1144   PHURINE 7.0 12/17/2023 1144   GLUCOSEU NEGATIVE 12/17/2023 1144   HGBUR NEGATIVE 12/17/2023 1144   BILIRUBINUR NEGATIVE 12/17/2023 1144   KETONESUR NEGATIVE 12/17/2023 1144   PROTEINUR NEGATIVE 12/17/2023 1144   NITRITE  NEGATIVE 12/17/2023 1144   LEUKOCYTESUR NEGATIVE 12/17/2023 1144   Sepsis Labs: Invalid input(s): "PROCALCITONIN", "LACTICIDVEN"  Microbiology: Recent Results (from the past 240 hours)  Resp panel by RT-PCR (RSV, Flu A&B, Covid) Anterior Nasal Swab     Status: None   Collection Time: 12/17/23  4:04 PM   Specimen: Anterior  Nasal Swab  Result Value Ref Range Status   SARS Coronavirus 2 by RT PCR NEGATIVE NEGATIVE Final   Influenza A by PCR NEGATIVE NEGATIVE Final   Influenza B by PCR NEGATIVE NEGATIVE Final    Comment: (NOTE) The Xpert Xpress SARS-CoV-2/FLU/RSV plus assay is intended as an aid in the diagnosis of influenza from Nasopharyngeal swab specimens and should not be used as a sole basis for treatment. Nasal washings and aspirates are unacceptable for Xpert Xpress SARS-CoV-2/FLU/RSV testing.  Fact Sheet for Patients: BloggerCourse.com  Fact Sheet for Healthcare Providers: SeriousBroker.it  This test is not yet approved or cleared by the [place] FDA and has been authorized for detection and/or diagnosis of SARS-CoV-2 by FDA under an Emergency Use Authorization (EUA). This EUA will remain in effect (meaning this test can be used) for the duration of the COVID-19 declaration under Section 564(b)(1) of the Act, 21 U.S.C. section 360bbb-3(b)(1), unless the authorization is terminated or revoked.     Resp Syncytial Virus by PCR NEGATIVE NEGATIVE Final    Comment: (NOTE) Fact Sheet for Patients: BloggerCourse.com  Fact Sheet for Healthcare Providers: SeriousBroker.it  This test is not yet approved or cleared by the [place] FDA and has been authorized for detection and/or diagnosis of SARS-CoV-2 by FDA under an Emergency Use Authorization (EUA). This EUA will remain in effect (meaning this test can be used) for the duration of the COVID-19 declaration under  Section 564(b)(1) of the Act, 21 U.S.C. section 360bbb-3(b)(1), unless the authorization is terminated or revoked.  Performed at [facility], [address], [place], [place] [address]   Culture, blood (Routine X 2) w Reflex to ID Panel     Status: None   Collection Time: 12/18/23 11:24 AM   Specimen: BLOOD LEFT ARM  Result Value Ref Range Status   Specimen Description BLOOD LEFT ARM  Final   Special Requests   Final    AEROBIC BOTTLE ONLY Blood Culture results may not be optimal due to an inadequate volume of blood received in culture bottles   Culture   Final    NO GROWTH 5 DAYS Performed at [facility], [address] [place], [place] [address]    Report Status 12/23/2023 FINAL  Final  Culture, blood (Routine X 2) w Reflex to ID Panel     Status: None   Collection Time: 12/18/23 11:33 AM   Specimen: BLOOD LEFT ARM  Result Value Ref Range Status   Specimen Description BLOOD LEFT ARM  Final   Special Requests   Final    BOTTLES DRAWN AEROBIC AND ANAEROBIC Blood Culture results may not be optimal due to an inadequate volume of blood received in culture bottles   Culture   Final    NO GROWTH 5 DAYS Performed at [facility], [address], [place], [place] [address]    Report Status 12/23/2023 FINAL  Final    Radiology Studies: No results found.     Adelfa Lozito T. Bryelle Spiewak [facility]  If 7PM-7AM, please contact night-coverage [email or web address] 12/23/2023, 11:07 AM

## 2023-12-23 NOTE — TOC Progression Note (Signed)
Transition of Care [facility]) - Progression Note    Patient Details  Name: Hector Booker MRN: [id number] Date of Birth: Dec 29, 1960  Transition of Care [facility]) CM/SW Contact  Jeyda Siebel A [place], LCSW Phone Number: 12/23/2023, 4:30 PM  Clinical Narrative:      CSW was informed by Crystal at [place] that pt's authorization was approved to facility.   Pt has bed available tomorrow per [name],    TOC will continue to follow.       Expected Discharge Plan and Services                                               Social Determinants of Health (SDOH) Interventions SDOH Screenings   Food Insecurity: No Food Insecurity (12/22/2023)  Housing: High Risk (12/22/2023)  Transportation Needs: No Transportation Needs (12/22/2023)  Utilities: Not At Risk (12/22/2023)  Tobacco Use: High Risk (12/17/2023)    Readmission Risk Interventions     No data to display

## 2023-12-23 NOTE — Plan of Care (Signed)

## 2023-12-23 NOTE — Progress Notes (Signed)
Mobility Specialist: Progress Note   12/23/23 1536  Mobility  Activity Ambulated with assistance in room  Level of Assistance Contact guard assist, steadying assist  Assistive Device Front wheel walker  Distance Ambulated (ft) 30 ft  Activity Response Tolerated well  Mobility Referral Yes  Mobility visit 1 Mobility  Mobility Specialist Start Time (ACUTE ONLY) 1415  Mobility Specialist Stop Time (ACUTE ONLY) 1423  Mobility Specialist Time Calculation (min) (ACUTE ONLY) 8 min    Pt was agreeable to mobility session - received in bed. CG throughout ambulation. Very slow rise up from EOB but no physical assistance needed. No complaints. Left in bed with all needs met, call bell in reach.   Maurene Capes Mobility Specialist Please contact via SecureChat or Rehab office at [phone]

## 2023-12-23 NOTE — Progress Notes (Signed)
Occupational Therapy Treatment Patient Details Name: Hector Booker MRN: [id number] DOB: Mar 26, 1961 Today's Date: 12/23/2023   History of present illness Pt is a 63 y/o M presenting to ED with weakness and slurred speech after sustaining fall at home. MRI revealed acute infarcts and found to have pneumonia. PMH includes CVA with residual left sided weakness, essential hypertension and hyperlipidemia.   OT comments  Pt presented in bed and agreeable to get to the chair and change of position. Pt was able to completed donning sock long sitting to sitting at EOB with min to mod assist. He then completed UE dressing at EOB with min assist. Then completed sit to stand transfer with min guard and ambulated to chair with min assist and 2WW. Patient will benefit from continued inpatient follow up therapy, <3 hours/day.      If plan is discharge home, recommend the following:  Assistance with cooking/housework;Assist for transportation;Help with stairs or ramp for entrance;A little help with bathing/dressing/bathroom   Equipment Recommendations  BSC/3in1    Recommendations for Other Services      Precautions / Restrictions Precautions Precautions: Fall Precaution/Restrictions Comments: L hemiparesis Restrictions Weight Bearing Restrictions Per Provider Order: No Other Position/Activity Restrictions: LUE sling for comfort per orders       Mobility Bed Mobility Overal bed mobility: Modified Independent Bed Mobility: Supine to Sit           General bed mobility comments: Pt needs increase in time, with slight elevation at [facility]    Transfers Overall transfer level: Needs assistance Equipment used: Rolling walker (2 wheels) Transfers: Sit to/from Stand Sit to Stand: Contact guard assist                 Balance Overall balance assessment: Needs assistance Sitting-balance support: Feet supported Sitting balance-Leahy Scale: Fair     Standing balance support: Bilateral upper  extremity supported Standing balance-Leahy Scale: Fair Standing balance comment: needs UE support and easily distracted looking out the window                           ADL either performed or assessed with clinical judgement   ADL Overall ADL's : Needs assistance/impaired     Grooming: Wash/dry hands;Wash/dry face;Contact guard assist;Sitting   Upper Body Bathing: Minimal assistance;Sitting   Lower Body Bathing: Minimal assistance;Sit to/from stand;Sitting/lateral leans;Cueing for safety;Cueing for sequencing   Upper Body Dressing : Minimal assistance;Sitting   Lower Body Dressing: Minimal assistance;Moderate assistance Lower Body Dressing Details (indicate cue type and reason): donned sock while part long sitting and then sitting at EOB Toilet Transfer: Minimal assistance;Ambulation;Rolling walker (2 wheels);Comfort height toilet;Grab bars   Toileting- Clothing Manipulation and Hygiene: Minimal assistance;Sit to/from stand       Functional mobility during ADLs: Minimal assistance;Rolling walker (2 wheels)      Extremity/Trunk Assessment Upper Extremity Assessment Upper Extremity Assessment: LUE deficits/detail LUE Deficits / Details: Pt completed some AROM in LUE and did not report about wanting to use sling for comfort at this time. Pt was able to complete some flexion with abduction when donning new gown on LUE Sensation: WNL LUE Coordination: decreased fine motor;decreased gross motor            Vision   Vision Assessment?: No apparent visual deficits   Perception Perception Perception: Within Functional Limits   Praxis Praxis Praxis: WFL   Communication Communication Communication: Impaired Factors Affecting Communication: Reduced clarity of speech   Cognition Arousal: Alert  Behavior During Therapy: Flat affect Cognition: No family/caregiver present to determine baseline                               Following commands: Intact         Cueing   Cueing Techniques: Verbal cues  Exercises      Shoulder Instructions       General Comments      Pertinent Vitals/ Pain       Pain Assessment Pain Assessment: Faces Faces Pain Scale: Hurts little more Breathing: normal Negative Vocalization: occasional moan/groan, low speech, negative/disapproving quality Facial Expression: smiling or inexpressive Body Language: relaxed Consolability: no need to console PAINAD Score: 1 Pain Location: L knee pain-chronic had knee present but did not want to don at this time Pain Descriptors / Indicators: Discomfort Pain Intervention(s): Limited activity within patient's tolerance, Monitored during session, Repositioned  Home Living                                          Prior Functioning/Environment              Frequency  Min 1X/week        Progress Toward Goals  OT Goals(current goals can now be found in the care plan section)  Progress towards OT goals: Progressing toward goals  Acute Rehab OT Goals Patient Stated Goal: none reported OT Goal Formulation: With patient Time For Goal Achievement: 01/01/24 Potential to Achieve Goals: Good ADL Goals Pt Will Perform Lower Body Bathing: with supervision;sit to/from stand Pt Will Perform Lower Body Dressing: with min assist;sit to/from stand Pt Will Transfer to Toilet: with supervision;ambulating;bedside commode Pt Will Perform Toileting - Clothing Manipulation and hygiene: with supervision;sit to/from stand  Plan      Co-evaluation                 AM-PAC OT "6 Clicks" Daily Activity     Outcome Measure   Help from another person eating meals?: None Help from another person taking care of personal grooming?: A Little Help from another person toileting, which includes using toliet, bedpan, or urinal?: A Little Help from another person bathing (including washing, rinsing, drying)?: A Little Help from another person to put on and  taking off regular upper body clothing?: A Little Help from another person to put on and taking off regular lower body clothing?: A Lot 6 Click Score: 18    End of Session Equipment Utilized During Treatment: Gait belt;Rolling walker (2 wheels)  OT Visit Diagnosis: Unsteadiness on feet (R26.81);Other abnormalities of gait and mobility (R26.89);Repeated falls (R29.6);Muscle weakness (generalized) (M62.81);Hemiplegia and hemiparesis Hemiplegia - Right/Left: Left Hemiplegia - dominant/non-dominant: Dominant Hemiplegia - caused by: Cerebral infarction   Activity Tolerance Patient tolerated treatment well   Patient Left in chair;with call bell/phone within reach;with chair alarm set   Nurse Communication Mobility status        Time: 7829-5621 OT Time Calculation (min): 12 min  Charges: OT General Charges $OT Visit: 1 Visit OT Treatments $Self Care/Home Management : 8-22 mins  Presley Raddle OTR/L  Acute Rehab Services  [phone] office number   Alphia Moh 12/23/2023, 9:45 AM

## 2023-12-24 ENCOUNTER — Telehealth: Payer: Self-pay | Admitting: *Deleted

## 2023-12-24 ENCOUNTER — Encounter: Payer: Self-pay | Admitting: *Deleted

## 2023-12-24 ENCOUNTER — Other Ambulatory Visit: Payer: Self-pay | Admitting: Cardiology

## 2023-12-24 DIAGNOSIS — I639 Cerebral infarction, unspecified: Secondary | ICD-10-CM

## 2023-12-24 DIAGNOSIS — R471 Dysarthria and anarthria: Secondary | ICD-10-CM | POA: Diagnosis not present

## 2023-12-24 DIAGNOSIS — E440 Moderate protein-calorie malnutrition: Secondary | ICD-10-CM | POA: Diagnosis not present

## 2023-12-24 DIAGNOSIS — J690 Pneumonitis due to inhalation of food and vomit: Secondary | ICD-10-CM | POA: Diagnosis not present

## 2023-12-24 DIAGNOSIS — R531 Weakness: Secondary | ICD-10-CM | POA: Diagnosis not present

## 2023-12-24 MED ORDER — CLOPIDOGREL BISULFATE 75 MG PO TABS
75.0000 mg | ORAL_TABLET | Freq: Every day | ORAL | Status: AC
Start: 2023-12-24 — End: 2024-01-08

## 2023-12-24 NOTE — Progress Notes (Unsigned)
Patient ID: Hector Booker, male   DOB: 1961/03/31, 63 y.o.   MRN: [id number] Patient enrolled for [employer] / [employer] to ship a 30 day cardiac event monitor to :  [facility] [address] [place] [address]  Attn: Director of Nursing  Letter with instructions mailed to above address.  Dr. Odis Hollingshead to read.

## 2023-12-24 NOTE — TOC Transition Note (Signed)
Transition of Care [facility]) - Discharge Note   Patient Details  Name: Hector Booker MRN: [id number] Date of Birth: [birth year]  Transition of Care [facility]) CM/SW Contact:  Kennady Zimmerle A [place], LCSW Phone Number: 12/24/2023, 12:27 PM   Clinical Narrative:     [facility] to: [name]  Anticipated DC date: 12/24/23  Family notified: Orville Govern  Transport by: Sharin Mons      Per MD patient ready for DC to [facility] . RN, patient, patient's family, and facility notified of DC. Discharge Summary and FL2 sent to facility. RN to call report prior to discharge [phone]). DC packet on chart. Ambulance transport requested for patient.     CSW will sign off for now as social work intervention is no longer needed. Please consult [place] again if new needs arise.   Final next level of care: Skilled Nursing Facility Barriers to Discharge: Barriers Resolved   Patient Goals and CMS Choice            Discharge Placement              Patient chooses bed at: [facility] Patient to be transferred to facility by: [facility] Name of family member notified: Beverly Suriano Patient and family notified of of transfer: 12/24/23  Discharge Plan and Services Additional resources added to the After Visit Summary for                                       Social Drivers of Health (SDOH) Interventions SDOH Screenings   Food Insecurity: No Food Insecurity (12/22/2023)  Housing: High Risk (12/22/2023)  Transportation Needs: No Transportation Needs (12/22/2023)  Utilities: Not At Risk (12/22/2023)  Tobacco Use: High Risk (12/17/2023)     Readmission Risk Interventions     No data to display

## 2023-12-24 NOTE — Plan of Care (Signed)

## 2023-12-24 NOTE — Telephone Encounter (Signed)
[name] has been discharged to your facility.We have been asked to set the patient up with a 30 day cardiac event monitor. Please call Jayjay Littles in monitors at [phone] with the patients room number.  We will also need to know who to send the instructions to.  The patient will be enrolled for his monitor to be shipped directly to your facility.

## 2023-12-24 NOTE — Discharge Summary (Signed)
Physician Discharge Summary  Hector Booker EAV:[id number] DOB: 1961-03-16 DOA: 12/17/2023  PCP: [name], MD  Admit date: 12/17/2023 Discharge date: 12/24/23  Admitted From: Home Disposition: SNF Recommendations for Outpatient Follow-up:  Outpatient follow-up with neurology as below Cardiology to arrange 30-day heart monitor and outpatient follow-up Check CMP and CBC in 1 week Reassess blood pressure and adjust meds as appropriate Please follow up on the following pending results: None   Discharge Condition: Stable CODE STATUS: Full code  Follow-up Information     [facility]. Schedule an appointment as soon as possible for a visit in 6 week(s).   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  CXR with patchy RLL opacity/infiltrate.  X-ray of his left elbow showed irregularity at radial head concerning for nondisplaced fracture.  Initially started on Levaquin in ED and transition to ceftriaxone and Flagyl for aspiration pneumonia.   Further workup with MRI brain showed acute left pontine and left frontal CVA.  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  TTE without significant finding.  A1c 5.1%.  LDL 45.  Patient was on Plavix at home.  Neurology recommended Plavix and aspirin for 3 weeks followed by aspirin alone.  Therapy recommended CIR.  However, CIR recommended SNF since patient does not have 24/7 support at home.   Patient is discharged to SNF.  Patient has completed 5 days of ceftriaxone and Flagyl for aspiration pneumonia.  Respiratory symptoms resolved.  No oxygen requirement.  See individual problem list below for more.   Problems addressed during this hospitalization Aspiration pneumonia: Patient with history of vocal cord anomaly and CVA.  Presents with generalized weakness.   CXR raise concern for RLL infiltrate. No report of respiratory symptoms.  On room air. -Levaquin 2/13 >> CTX and Flagyl 2/13-2/18/2025.  -Appreciate help by SLP-dysphagia 1 diet and barium swallow test -Aspiration precaution   Mechanical fall at home: Imaging showed nondisplaced left radial head fracture.  CT head and hip x-ray without significant finding.  Ortho recommended sling for comfort and outpatient follow-up.  -Fall precaution -Continue PT/OT.   Acute ischemic CVA-likely embolic source History of CVA with residual left-sided deficit -MRI brain showed acute left pontine and left frontal CVA.  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  TTE without significant finding.  A1c 5.1%.  LDL 45.  -Appreciate neurorecommendations -Continue Plavix and aspirin for 3 weeks, then aspirin alone -Consider 30-day cardiac monitor at discharge.  Cardiology to arrange this. -BP goal normotensive.  Adjust meds as below. -Continue SLP for dysphagia   Dysarthria and dysphagia-patient with underlying vocal cord anomaly. S/p SLP evaluation and MBS. -Continue dysphagia 1 diet per SLP. -Continue speech and swallow eval at SNF.   Left radial head nondisplaced fracture -Supportive care with sling as needed per [facility]. -Outpatient follow-up   Hypokalemia: Resolved.   Essential hypertension: Normotensive. -Increased lisinopril to 30 mg  daily on 2/18 -Continue amlodipine 10 mg daily -Added Coreg 6.25 mg twice daily on  2/19 -Reassess and adjust meds as appropriate   Hyperlipidemia LDL 45. -Continue Lipitor   Chronic knee osteoarthritis: No fractures -Tylenol, Norco and Voltaren gel -PT/OT   Tobacco use disorder: Smokes about 1/4 pack a day. -Encourage cessation   Prior history of polysubstance use including cocaine: UDS positive for opiates.  Unwitnessed fall in the hospital: Found sitting on the floor the evening of 12/23/2023.  Reportedly he slipped on his pants while trying to bathroom.  No trauma or injury.  No new focal neurodeficit throughout the night. -Continue fall precaution   Moderate malnutrition Nutrition Problem: Moderate Malnutrition Etiology: acute illness Signs/Symptoms: moderate fat depletion, mild muscle depletion, energy intake < or equal to 50% for > or equal to 5 days Interventions: Ensure Enlive (each supplement provides 350kcal and 20 grams of protein), MVI     Time spent 35 minutes  Vital signs Vitals:   12/23/23 1748 12/23/23 2025 12/24/23 0019 12/24/23 0441  BP: 137/82 121/78 133/76 135/78  Pulse: 73 66 67 80  Temp: 98 F (36.7 C) 97.9 F (36.6 C) 97.7 F (36.5 C) 97.6 F (36.4 C)  Resp:  17 17 18   Height:      Weight:      SpO2: 95% 96% 91% 94%  TempSrc: Oral     BMI (Calculated):         Discharge exam  GENERAL: No apparent distress.  Nontoxic. HEENT: MMM.  Vision and hearing grossly intact.  NECK: Supple.  No apparent JVD.  RESP:  No IWOB.  Fair aeration bilaterally. CVS:  RRR. Heart sounds normal.  ABD/GI/GU: BS+. Abd soft, NTND.  MSK/EXT:  Moves extremities. No apparent deformity. No edema.  SKIN: no apparent skin lesion or wound NEURO: Awake and alert. Oriented appropriately.  Dysarthria.  Motor 4/5 in LUE and LLE and 5/5 on the right. PSYCH: Calm. Normal affect.   Discharge Instructions Discharge Instructions     Diet - low sodium heart healthy   Complete by: As directed    Increase activity slowly   Complete by: As directed        Allergies as of 12/24/2023       Reactions   Penicillins Anaphylaxis   Has patient had a PCN reaction causing immediate rash, facial/tongue/throat swelling, SOB or lightheadedness with hypotension: Yes Has patient had a PCN reaction causing severe rash involving mucus membranes or skin necrosis: No Has patient had a PCN reaction that required hospitalization Yes Has patient had a PCN reaction occurring within the last 10 years: No If all of the above answers are "NO", then may proceed with Cephalosporin use.        Medication List     STOP taking these medications    acetaminophen 650 MG CR tablet Commonly known as: TYLENOL Replaced by: acetaminophen 325 MG tablet       TAKE these medications    acetaminophen 325 MG tablet Commonly known as: TYLENOL Take 2 tablets (650 mg total) by mouth every 6 (six) hours as needed for mild pain (pain score 1-3) (or Fever >/= 101). Replaces: acetaminophen 650 MG CR tablet   amLODipine 10 MG tablet Commonly known as: NORVASC Take 1 tablet (10 mg total) by mouth daily.   aspirin EC 81 MG tablet Take 1 tablet (81 mg total) by mouth daily. Swallow whole.   atorvastatin 80 MG tablet Commonly known as: LIPITOR Take 1 tablet (80  mg total) by mouth daily at 6 PM. What changed:  medication strength how much to take   carvedilol 6.25 MG tablet Commonly known as: COREG Take 1 tablet (6.25 mg total) by mouth 2 (two) times daily with a meal.   clopidogrel 75 MG tablet Commonly known as: PLAVIX Take 1 tablet (75 mg total) by mouth daily for 15 days. Stop after 15 days What changed: additional instructions   diclofenac sodium 1 % Gel Commonly known as: VOLTAREN Apply 2-4 g topically 4 (four) times daily as needed (for hand or knee pain).   DULoxetine 60 MG capsule Commonly known as: CYMBALTA Take 60 mg by mouth daily.   famotidine 20 MG tablet Commonly known as: PEPCID Take 1 tablet (20 mg total) by mouth 2 (two) times daily.    gabapentin 300 MG capsule Commonly known as: NEURONTIN Take 1 capsule (300 mg total) by mouth 3 (three) times daily. Take 300 mg by mouth in the morning, 300 mg in the afternoon, and 600 mg at bedtime What changed:  how much to take when to take this   HYDROcodone-acetaminophen 10-325 MG tablet Commonly known as: NORCO Take 1 tablet by mouth every 6 (six) hours as needed for up to 7 days for moderate pain (pain score 4-6) or severe pain (pain score 7-10). Take 1 tablet by mouth every 4-5 hours What changed: reasons to take this   lisinopril 30 MG tablet Commonly known as: ZESTRIL Take 1 tablet (30 mg total) by mouth daily. What changed:  medication strength how much to take   multivitamin with minerals Tabs tablet Take 1 tablet by mouth daily.        Consultations: Neurology for stroke Cardiology for 30-day heart monitor  Procedures/Studies:   DG Swallowing Func-Speech Pathology Result Date: 12/21/2023 Table formatting from the original result was not included. Modified Barium Swallow Study Patient Details Name: Hector Booker MRN: [id number] Date of Birth: 1960-12-25 Today's Date: 12/21/2023 HPI/PMH: HPI: Pt is a 63 y/o M presenting to ED with weakness, slurred speech, and trouble swallowing after sustaining fall at home. MRI revealed acute infarcts in the left pons and high left frontal white matter. CXR concerning for RLL PNA. CTA showed asymmetric fullness of the R vocal cord. 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 Clinical Impression: Pt has an oropharyngeal dysphagia with incomplete mastication, slow posterior transit, but minimal oral or pharyngeal residue. He also has incomplete laryngeal vestibule closure, and when combined with mistiming of the bolus, this leads to aspiration with thin and nectar thick liquids that at times is transient, but inconsistently elicits a cough (PAS 6-8). Boluses also tend to  spill over his epiglottis before the swallow, and a chin tuck helps to allow more of the bolus to enter the valleculae and stay out of the airway with nectar thick liquids. Aspiration is not prevented with thin liquids despite attempts at strategies. He has no aspiration with thicker consistencies, but there is some penetration with purees that does clear on subsequent swallows. Given pt's current mentation, recommend starting with Dys 1 diet and honey thick liquids; however, if he can consistently remember to use a chin tuck, could advance clinically to nectar thick. DIGEST Swallow Severity Rating*  Safety: 3  Efficiency: 0  Overall Pharyngeal Swallow Severity: 3 1: mild; 2: moderate; 3: severe; 4: profound *The Dynamic Imaging Grade of Swallowing Toxicity is standardized for the head and neck cancer population, however, demonstrates  promising clinical applications across populations to standardize the clinical rating of pharyngeal swallow safety and severity. Factors that may increase risk of adverse event in presence of aspiration Rubye Oaks & Clearance Coots 2021): Factors that may increase risk of adverse event in presence of aspiration Rubye Oaks & Clearance Coots 2021): Respiratory or GI disease; Reduced cognitive function; Limited mobility; Weak cough; Aspiration of thick, dense, and/or acidic materials Recommendations/Plan: Swallowing Evaluation Recommendations Swallowing Evaluation Recommendations Recommendations: PO diet PO Diet Recommendation: Dysphagia 1 (Pureed); Moderately thick liquids (Level 3, honey thick) Liquid Administration via: Cup; No straw Medication Administration: Whole meds with puree (crush larger pills) Supervision: Patient able to self-feed; Full supervision/cueing for swallowing strategies Swallowing strategies  : Minimize environmental distractions; Slow rate; Small bites/sips Postural changes: Position pt fully upright for meals; Stay upright 30-60 min after meals Oral care recommendations: Oral care BID  (2x/day) Caregiver Recommendations: Have oral suction available; Avoid jello, ice cream, thin soups, popsicles; Remove water pitcher Treatment Plan Treatment Plan Treatment recommendations: Therapy as outlined in treatment plan below Follow-up recommendations: Skilled nursing-short term rehab (<3 hours/day) Functional status assessment: Patient has had a recent decline in their functional status and demonstrates the ability to make significant improvements in function in a reasonable and predictable amount of time. Treatment frequency: Min 2x/week Treatment duration: 2 weeks Interventions: Aspiration precaution training; Compensatory techniques; Patient/family education; Trials of upgraded texture/liquids; Diet toleration management by SLP; Respiratory muscle strength training Recommendations Recommendations for follow up therapy are one component of a multi-disciplinary discharge planning process, led by the attending physician.  Recommendations may be updated based on patient status, additional functional criteria and insurance authorization. Assessment: Orofacial Exam: Orofacial Exam Oral Cavity - Dentition: Edentulous; Dentures, not available (pt says he doesn't always eat with his dentures) Anatomy: Anatomy: Suspected cervical osteophytes; Other (Comment) (asymmetry of R vocal fold per other imaging) Boluses Administered: Boluses Administered Boluses Administered: Thin liquids (Level 0); Mildly thick liquids (Level 2, nectar thick); Moderately thick liquids (Level 3, honey thick); Puree; Solid  Oral Impairment Domain: Oral Impairment Domain Lip Closure: No labial escape Tongue control during bolus hold: Posterior escape of less than half of bolus Bolus preparation/mastication: Disorganized chewing/mashing with solid pieces of bolus unchewed Bolus transport/lingual motion: Slow tongue motion Oral residue: Trace residue lining oral structures Location of oral residue : Floor of mouth; Lateral sulci Initiation of  pharyngeal swallow : Pyriform sinuses  Pharyngeal Impairment Domain: Pharyngeal Impairment Domain Soft palate elevation: No bolus between soft palate (SP)/pharyngeal wall (PW) Laryngeal elevation: Complete superior movement of thyroid cartilage with complete approximation of arytenoids to epiglottic petiole Anterior hyoid excursion: Complete anterior movement Epiglottic movement: Complete inversion Laryngeal vestibule closure: Incomplete, narrow column air/contrast in laryngeal vestibule Pharyngeal stripping wave : Present - complete Pharyngeal contraction (A/P view only): N/A Pharyngoesophageal segment opening: Complete distension and complete duration, no obstruction of flow Tongue base retraction: Trace column of contrast or air between tongue base and PPW Pharyngeal residue: Trace residue within or on pharyngeal structures Location of pharyngeal residue: Valleculae  Esophageal Impairment Domain: Esophageal Impairment Domain Esophageal clearance upright position: Esophageal retention with retrograde flow through the PES Pill: Pill Consistency administered: Puree Puree: Impaired (see clinical impressions) Penetration/Aspiration Scale Score: Penetration/Aspiration Scale Score 1.  Material does not enter airway: Moderately thick liquids (Level 3, honey thick) 2.  Material enters airway, remains ABOVE vocal cords then ejected out: Puree 7.  Material enters airway, passes BELOW cords and not ejected out despite cough attempt by patient: Mildly thick liquids (Level 2, nectar  thick) 8.  Material enters airway, passes BELOW cords without attempt by patient to eject out (silent aspiration) : Thin liquids (Level 0) Compensatory Strategies: Compensatory Strategies Compensatory strategies: Yes Straw: Ineffective Ineffective Straw: Thin liquid (Level 0); Mildly thick liquid (Level 2, nectar thick) Chin tuck: Ineffective Ineffective Chin Tuck: Thin liquid (Level 0) Right head turn: Ineffective Ineffective Right Head Turn: Thin  liquid (Level 0)   General Information: Caregiver present: No  Diet Prior to this Study: Dysphagia 1 (pureed); Thin liquids (Level 0)   Temperature : Normal   Respiratory Status: WFL   Supplemental O2: None (Room air)   History of Recent Intubation: No  Behavior/Cognition: Alert; Requires cueing Self-Feeding Abilities: Able to self-feed Baseline vocal quality/speech: Normal Volitional Cough: Able to elicit Volitional Swallow: Able to elicit Exam Limitations: No limitations Goal Planning: Prognosis for improved oropharyngeal function: Good Barriers to Reach Goals: Cognitive deficits No data recorded Patient/Family Stated Goal: wants to eat good food Consulted and agree with results and recommendations: Patient Pain: Pain Assessment Pain Assessment: Faces Pain Score: 9 Faces Pain Scale: 0 Pain Location: L knee - chronic pain Pain Descriptors / Indicators: Discomfort Pain Intervention(s): Monitored during session; Limited activity within patient's tolerance; Repositioned End of Session: Start Time:SLP Start Time (ACUTE ONLY): 1610 Stop Time: SLP Stop Time (ACUTE ONLY): 1000 Time Calculation:SLP Time Calculation (min) (ACUTE ONLY): 22 min Charges: SLP Evaluations $ SLP Speech Visit: 1 Visit SLP Evaluations $MBS Swallow: 1 Procedure SLP visit diagnosis: SLP Visit Diagnosis: Dysphagia, oropharyngeal phase (R13.12) Past Medical History: Past Medical History: Diagnosis Date  Alcohol abuse 12/04/2016  Arthritis   Cocaine abuse (HCC) 12/02/2016  Hyperlipemia   Stroke [facility])  Past Surgical History: Past Surgical History: Procedure Laterality Date  IR GENERIC HISTORICAL  12/02/2016  IR ANGIO VERTEBRAL SEL VERTEBRAL BILAT MOD SED 12/02/2016 Julieanne Cotton, MD [facility]INTER[facility]  IR GENERIC HISTORICAL  12/02/2016  IR ANGIO INTRA EXTRACRAN SEL COM CAROTID INNOMINATE BILAT MOD SED 12/02/2016 Julieanne Cotton, MD [facility]  LOWER EXTREMITY ANGIOGRAPHY N/A 07/29/2019  Procedure: LOWER EXTREMITY ANGIOGRAPHY;  Surgeon: Chuck Hint, MD;  Location: [facility] INVASIVE CV LAB;  Service: Cardiovascular;  Laterality: N/A; Mahala Menghini., M.A. CCC-SLP Acute Rehabilitation Services Office [phone] Secure chat preferred 12/21/2023, 12:36 PM  CT ANGIO HEAD NECK W WO CM Result Date: 12/19/2023 CLINICAL DATA:  Stroke/TIA, determine embolic source MRI showed subacute ischemic stroke. EXAM: CT ANGIOGRAPHY HEAD AND NECK WITH AND WITHOUT CONTRAST TECHNIQUE: Multidetector CT imaging of the head and neck was performed using the standard protocol during bolus administration of intravenous contrast. Multiplanar CT image reconstructions and MIPs were obtained to evaluate the vascular anatomy. Carotid stenosis measurements (when applicable) are obtained utilizing NASCET criteria, using the distal internal carotid diameter as the denominator. RADIATION DOSE REDUCTION: This exam was performed according to the departmental dose-optimization program which includes automated exposure control, adjustment of the mA and/or kV according to patient size and/or use of iterative reconstruction technique. CONTRAST:  75mL OMNIPAQUE IOHEXOL 350 MG/ML SOLN COMPARISON:  MRI head December 17, 2023. CTA head/neck October 31, 2021. FINDINGS: CT HEAD FINDINGS Brain: Known acute infarcts better seen on recent MRI. Remote right posterior MCA territory infarct. No evidence of acute hemorrhage, mass lesion, midline shift or hydrocephalus. Chronic microvascular ischemic change. Vascular: See below. Skull: No acute fracture. Sinuses/Orbits: Clear sinuses.  No acute orbital findings. Other: No mastoid effusions. Review of the MIP images confirms the above findings CTA NECK FINDINGS Aortic arch: Great vessel origins are patent without  significant stenosis. Aortic atherosclerosis. Right carotid system: No evidence of dissection, stenosis (50% or greater), or occlusion. Left carotid system: No evidence of dissection, stenosis (50% or greater), or occlusion. Vertebral arteries: Multifocal severe  stenosis of the right V2 vertebral artery. Left vertebral artery is patent without greater than 50% stenosis. Skeleton: No acute abnormality on limited assessment. Other neck: With no acute abnormality on limited assessment. Upper chest: Emphysema. Review of the MIP images confirms the above findings CTA HEAD FINDINGS Anterior circulation: Bilateral intracranial ICAs, MCAs, and ACAs are patent without proximal high-grade stenosis. Posterior circulation: Unchanged appearance of occluded right intradural vertebral artery distal to PICA. Left vertebral artery remains patent. Unchanged moderate basilar artery stenosis. Bilateral posterior cerebral arteries are patent without proximal high-grade stenosis. Venous sinuses: As permitted by contrast timing, patent. Review of the MIP images confirms the above findings IMPRESSION: 1. No emergent large vessel occlusion. 2. Occluded non dominant right intradural vertebral artery appears similar to the 2022 prior. 3. 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 4. Aortic Atherosclerosis (ICD10-I70.0) and Emphysema (ICD10-J43.9). Electronically Signed   By: Feliberto Harts M.D.   On: 12/19/2023 01:03   ECHOCARDIOGRAM COMPLETE Result Date: 12/18/2023    ECHOCARDIOGRAM REPORT   Patient Name:   Hector Booker Date of Exam: 12/18/2023 Medical Rec #:  [id number]      Height:       70.0 in Accession #:    [id number]     Weight:       159.0 lb Date of Birth:  [birth year]       BSA:          1.893 m Patient Age:    62 years       BP:           159/77 mmHg Patient Gender: M              HR:           66 bpm. Exam Location:  Inpatient Procedure: 2D Echo, Cardiac Doppler and Color Doppler (Both Spectral and Color            Flow Doppler were utilized during procedure). Indications:    Stroke  History:        Patient has prior history of Echocardiogram examinations, most                 recent 11/01/2021. Stroke.  Sonographer:    Darlys Gales Referring Phys: [id number] SUBRINA SUNDIL  IMPRESSIONS  1. Left ventricular ejection fraction, by estimation, is 55 to 60%. The left ventricle has normal function. The left ventricle has no regional wall motion abnormalities. Left ventricular diastolic parameters are consistent with Grade I diastolic dysfunction (impaired relaxation).  2. Right ventricular systolic function is normal. The right ventricular size is normal.  3. The mitral valve is normal in structure. No evidence of mitral valve regurgitation. No evidence of mitral stenosis.  4. The aortic valve was not well visualized. Aortic valve regurgitation is mild. No aortic stenosis is present. Aortic regurgitation PHT measures 960 msec. Aortic valve area, by VTI measures 2.41 cm. Aortic valve mean gradient measures 4.0 mmHg. Aortic  valve Vmax measures 1.37 m/s.  5. The inferior vena cava is normal in size with greater than 50% respiratory variability, suggesting right atrial pressure of 3 mmHg. FINDINGS  Left Ventricle: Left ventricular ejection fraction, by estimation, is 55 to 60%. The left ventricle has normal function. The left ventricle has no regional wall motion abnormalities. Strain  imaging was not performed. The left ventricular internal cavity  size was normal in size. There is no left ventricular hypertrophy. Left ventricular diastolic parameters are consistent with Grade I diastolic dysfunction (impaired relaxation). Normal left ventricular filling pressure. Right Ventricle: The right ventricular size is normal. No increase in right ventricular wall thickness. Right ventricular systolic function is normal. Left Atrium: Left atrial size was normal in size. Right Atrium: Right atrial size was normal in size. Pericardium: There is no evidence of pericardial effusion. Mitral Valve: The mitral valve is normal in structure. No evidence of mitral valve regurgitation. No evidence of mitral valve stenosis. Tricuspid Valve: The tricuspid valve is normal in structure. Tricuspid valve regurgitation  is not demonstrated. No evidence of tricuspid stenosis. Aortic Valve: The aortic valve was not well visualized. Aortic valve regurgitation is mild. Aortic regurgitation PHT measures 960 msec. No aortic stenosis is present. Aortic valve mean gradient measures 4.0 mmHg. Aortic valve peak gradient measures 7.5 mmHg. Aortic valve area, by VTI measures 2.41 cm. Pulmonic Valve: The pulmonic valve was normal in structure. Pulmonic valve regurgitation is not visualized. No evidence of pulmonic stenosis. Aorta: The aortic root is normal in size and structure. Venous: The inferior vena cava is normal in size with greater than 50% respiratory variability, suggesting right atrial pressure of 3 mmHg. IAS/Shunts: No atrial level shunt detected by color flow Doppler. Additional Comments: 3D imaging was not performed.  LEFT VENTRICLE PLAX 2D LVOT diam:     2.00 cm   Diastology LV SV:         66        LV e' medial:    6.31 cm/s LV SV Index:   35        LV E/e' medial:  9.6 LVOT Area:     3.14 cm  LV e' lateral:   6.31 cm/s                          LV E/e' lateral: 9.6  RIGHT VENTRICLE RV S prime:     12.70 cm/s TAPSE (M-mode): 2.1 cm LEFT ATRIUM             Index        RIGHT ATRIUM           Index LA Vol (A2C):   32.9 ml 17.38 ml/m  RA Area:     13.60 cm LA Vol (A4C):   26.2 ml 13.84 ml/m  RA Volume:   34.20 ml  18.06 ml/m LA Biplane Vol: 30.0 ml 15.85 ml/m  AORTIC VALVE AV Area (Vmax):    2.50 cm AV Area (Vmean):   2.43 cm AV Area (VTI):     2.41 cm AV Vmax:           137.00 cm/s AV Vmean:          93.000 cm/s AV VTI:            0.273 m AV Peak Grad:      7.5 mmHg AV Mean Grad:      4.0 mmHg LVOT Vmax:         109.00 cm/s LVOT Vmean:        71.900 cm/s LVOT VTI:          0.209 m LVOT/AV VTI ratio: 0.77 AI PHT:            960 msec  AORTA Ao Root diam: 3.60 cm MITRAL VALVE MV Area (PHT):  2.62 cm    SHUNTS MV Decel Time: 289 msec    Systemic VTI:  0.21 m MV E velocity: 60.80 cm/s  Systemic Diam: 2.00 cm MV A velocity:  72.30 cm/s MV E/A ratio:  0.84 Chilton Si MD Electronically signed by Chilton Si MD Signature Date/Time: 12/18/2023/12:17:57 PM    Final    MR BRAIN WO CONTRAST Result Date: 12/17/2023 CLINICAL DATA:  Neuro deficit, acute, stroke suspected EXAM: MRI HEAD WITHOUT CONTRAST TECHNIQUE: Multiplanar, multiecho pulse sequences of the brain and surrounding structures were obtained without intravenous contrast. COMPARISON:  MRI 11/01/2021. FINDINGS: Brain: Acute left pontine infarct. Small acute infarcts in the high left frontal white matter. Mild edema without mass effect. Additional scattered T2/FLAIR hyperintensity in the white matter are compatible with chronic microvascular ischemic disease. Remote right posterior MCA territory infarct. Remote right pontine infarct. No evidence of acute hemorrhage, mass lesion, midline shift or hydrocephalus. Evidence of prior hemorrhage associated with the prior right MCA territory infarct. Vascular: Major arterial flow voids are maintained at the skull base. Skull and upper cervical spine: Normal marrow signal. Sinuses/Orbits: Clear sinuses.  No acute orbital findings. Other: No mastoid effusions. IMPRESSION: 1. Acute infarcts in the left pons and high left frontal white matter. 2. Remote right posterior MCA territory and right pontine infarcts. Electronically Signed   By: Feliberto Harts M.D.   On: 12/17/2023 21:52   CT HEAD WO CONTRAST ( ) Result Date: 12/17/2023 CLINICAL DATA:  Neuro deficit, acute, stroke suspected EXAM: CT HEAD WITHOUT CONTRAST TECHNIQUE: Contiguous axial images were obtained from the base of the skull through the vertex without intravenous contrast. RADIATION DOSE REDUCTION: This exam was performed according to the departmental dose-optimization program which includes automated exposure control, adjustment of the mA and/or kV according to patient size and/or use of iterative reconstruction technique. COMPARISON:  Head CT 11/07/2021 FINDINGS:  Brain: No hemorrhage. No hydrocephalus. No extra-axial fluid collection. No mass effect. No mass lesion. There is chronic posterior right MCA territory infarct and a chronic right pontine infarct. No hemorrhage. No hydrocephalus. No extra-axial fluid collection. No mass effect. No mass lesion. Vascular: No hyperdense vessel or unexpected calcification. Skull: Normal. Negative for fracture or focal lesion. Sinuses/Orbits: N mild mucosal thickening in the right maxillary sinus. Orbits are unremarkable. O middle ear or mastoid effusion Other: None. IMPRESSION: 1. No hemorrhage or CT evidence of an acute cortical infarct. 2. Chronic posterior right MCA territory infarct and chronic right pontine infarct. Electronically Signed   By: Lorenza Cambridge M.D.   On: 12/17/2023 17:44   DG Pelvis 1-2 Views Result Date: 12/17/2023 CLINICAL DATA:  Fall, weakness. EXAM: PELVIS - 1-2 VIEW COMPARISON:  None Available. FINDINGS: The cortical margins of the bony pelvis are intact. No fracture. Pubic symphysis and sacroiliac joints are congruent. Bilateral hip degenerative change. Both femoral heads are well-seated in the respective acetabula. Vascular and seminal vesicle calcifications. IMPRESSION: No pelvic fracture. Electronically Signed   By: Narda Rutherford M.D.   On: 12/17/2023 17:35   DG Elbow Complete Left Result Date: 12/17/2023 CLINICAL DATA:  Fall, weakness. EXAM: LEFT ELBOW - COMPLETE 3+ VIEW COMPARISON:  None Available. FINDINGS: There is mild irregularity of the radial head that may represent a nondisplaced fracture. No additional fracture. Normal alignment, no dislocation. There is a prominent anterior fat pad without definite joint effusion. IMPRESSION: Irregularity of the radial head may represent a nondisplaced fracture. Consider follow-up exam in 7-10 days. Electronically Signed   By: Ivette Loyal.D.  On: 12/17/2023 17:34   DG Knee 2 Views Left Result Date: 12/17/2023 CLINICAL DATA:  Fall, weakness.  EXAM: LEFT KNEE - 1-2 VIEW COMPARISON:  None Available. FINDINGS: No acute fracture or dislocation. Lateral tibiofemoral joint space narrowing. Moderate tricompartmental peripheral spurring. 15 mm ossified body in Hoffa's fat pad. Minimal knee joint effusion. No erosions or focal bone abnormality. IMPRESSION: 1. No acute fracture or dislocation. 2. Moderate tricompartmental osteoarthritis.  Small joint effusion. 3. Ossified body within Hoffa's fat pad. Electronically Signed   By: Narda Rutherford M.D.   On: 12/17/2023 17:32   DG Chest 2 View Result Date: 12/17/2023 CLINICAL DATA:  Weakness, fall. EXAM: CHEST - 2 VIEW COMPARISON:  Remote radiograph 09/28/2011 FINDINGS: Mild patchy opacity at the right lung base. Normal heart size and mediastinal contours. No pulmonary edema, pleural effusion or pneumothorax. No displaced rib fracture or acute osseous findings. IMPRESSION: Patchy right lower lobe opacity is nonspecific in the setting of fall. This may represent pneumonia in the appropriate clinical setting. Pulmonary contusion could appear similar. Electronically Signed   By: Narda Rutherford M.D.   On: 12/17/2023 17:31       The results of significant diagnostics from this hospitalization (including imaging, microbiology, ancillary and laboratory) are listed below for reference.     Microbiology: Recent Results (from the past 240 hours)  Resp panel by RT-PCR (RSV, Flu A&B, Covid) Anterior Nasal Swab     Status: None   Collection Time: 12/17/23  4:04 PM   Specimen: Anterior Nasal Swab  Result Value Ref Range Status   SARS Coronavirus 2 by RT PCR NEGATIVE NEGATIVE Final   Influenza A by PCR NEGATIVE NEGATIVE Final   Influenza B by PCR NEGATIVE NEGATIVE Final    Comment: (NOTE) The Xpert Xpress SARS-CoV-2/FLU/RSV plus assay is intended as an aid in the diagnosis of influenza from Nasopharyngeal swab specimens and should not be used as a sole basis for treatment. Nasal washings and aspirates are  unacceptable for Xpert Xpress SARS-CoV-2/FLU/RSV testing.  Fact Sheet for Patients: BloggerCourse.com  Fact Sheet for Healthcare Providers: SeriousBroker.it  This test is not yet approved or cleared by the [place] FDA and has been authorized for detection and/or diagnosis of SARS-CoV-2 by FDA under an Emergency Use Authorization (EUA). This EUA will remain in effect (meaning this test can be used) for the duration of the COVID-19 declaration under Section 564(b)(1) of the Act, 21 U.S.C. section 360bbb-3(b)(1), unless the authorization is terminated or revoked.     Resp Syncytial Virus by PCR NEGATIVE NEGATIVE Final    Comment: (NOTE) Fact Sheet for Patients: BloggerCourse.com  Fact Sheet for Healthcare Providers: SeriousBroker.it  This test is not yet approved or cleared by the [place] FDA and has been authorized for detection and/or diagnosis of SARS-CoV-2 by FDA under an Emergency Use Authorization (EUA). This EUA will remain in effect (meaning this test can be used) for the duration of the COVID-19 declaration under Section 564(b)(1) of the Act, 21 U.S.C. section 360bbb-3(b)(1), unless the authorization is terminated or revoked.  Performed at [facility], [address] [place], [place] [address]   Culture, blood (Routine X 2) w Reflex to ID Panel     Status: None   Collection Time: 12/18/23 11:24 AM   Specimen: BLOOD LEFT ARM  Result Value Ref Range Status   Specimen Description BLOOD LEFT ARM  Final   Special Requests   Final    AEROBIC BOTTLE ONLY Blood Culture results may  not be optimal due to an inadequate volume of blood received in culture bottles   Culture   Final    NO GROWTH 5 DAYS Performed at [facility], [address] [place], [place] [address]    Report Status 12/23/2023 FINAL  Final  Culture, blood (Routine X 2) w Reflex to  ID Panel     Status: None   Collection Time: 12/18/23 11:33 AM   Specimen: BLOOD LEFT ARM  Result Value Ref Range Status   Specimen Description BLOOD LEFT ARM  Final   Special Requests   Final    BOTTLES DRAWN AEROBIC AND ANAEROBIC Blood Culture results may not be optimal due to an inadequate volume of blood received in culture bottles   Culture   Final    NO GROWTH 5 DAYS Performed at [facility], [address], [place], [place] [address]    Report Status 12/23/2023 FINAL  Final     Labs:  CBC: Recent Labs  Lab 12/17/23 1741 12/18/23 1133 12/19/23 1044 12/20/23 0716  WBC 14.3* 7.8 6.7 6.5  NEUTROABS 10.8*  --   --   --   HGB 14.1 12.4* 13.2 13.1  HCT 41.1 35.7* 37.3* 37.5*  MCV 87.1 86.7 85.4 85.4  PLT 248 209 258 242   BMP &GFR Recent Labs  Lab 12/17/23 1741 12/18/23 1133 12/19/23 1044 12/20/23 0716 12/21/23 0654  NA 138 138 136 136 136  K 3.0* 2.8* 2.9* 3.0* 3.5  CL 96* 98 100 99 101  CO2 30 27 27 26 25   GLUCOSE 81 122* 97 87 83  BUN 8 6* <5* <5* 6*  CREATININE 0.65 0.65 0.68 0.57* 0.52*  CALCIUM 8.9 8.5* 8.5* 8.4* 8.5*  MG  --   --  1.8 2.2  --   PHOS  --   --   --  3.5 3.4   Estimated Creatinine Clearance: 97.6 mL/min (A) (by C-G formula based on SCr of 0.52 mg/dL (L)). Liver & Pancreas: Recent Labs  Lab 12/17/23 1741 12/18/23 1133 12/20/23 0716 12/21/23 0654  AST 20 20  --   --   ALT 12 12  --   --   ALKPHOS 84 67  --   --   BILITOT 1.3* 1.1  --   --   PROT 6.5 5.7*  --   --   ALBUMIN 2.9* 2.5* 2.7* 2.7*   No results for input(s): "LIPASE", "AMYLASE" in the last 168 hours. No results for input(s): "AMMONIA" in the last 168 hours. Diabetic: No results for input(s): "HGBA1C" in the last 72 hours. No results for input(s): "GLUCAP" in the last 168 hours. Cardiac Enzymes: No results for input(s): "CKTOTAL", "CKMB", "CKMBINDEX", "TROPONINI" in the last 168 hours. No results for input(s): "PROBNP" in the last 8760 hours. Coagulation  Profile: Recent Labs  Lab 12/17/23 1741  INR 1.2   Thyroid Function Tests: No results for input(s): "TSH", "T4TOTAL", "FREET4", "T3FREE", "THYROIDAB" in the last 72 hours. Lipid Profile: No results for input(s): "CHOL", "HDL", "LDLCALC", "TRIG", "CHOLHDL", "LDLDIRECT" in the last 72 hours. Anemia Panel: No results for input(s): "VITAMINB12", "FOLATE", "FERRITIN", "TIBC", "IRON", "RETICCTPCT" in the last 72 hours. Urine analysis:    Component Value Date/Time   COLORURINE YELLOW 12/17/2023 1144   APPEARANCEUR CLEAR 12/17/2023 1144   LABSPEC 1.023 12/17/2023 1144   PHURINE 7.0 12/17/2023 1144   GLUCOSEU NEGATIVE 12/17/2023 1144   HGBUR NEGATIVE 12/17/2023 1144   BILIRUBINUR NEGATIVE 12/17/2023 1144   KETONESUR  NEGATIVE 12/17/2023 1144   PROTEINUR NEGATIVE 12/17/2023 1144   NITRITE NEGATIVE 12/17/2023 1144   LEUKOCYTESUR NEGATIVE 12/17/2023 1144   Sepsis Labs: Invalid input(s): "PROCALCITONIN", "LACTICIDVEN"   SIGNED:  Almon Hercules, MD  [facility] 12/24/2023, 7:37 AM

## 2023-12-24 NOTE — Progress Notes (Signed)
Speech Language Pathology Treatment: Dysphagia  Patient Details Name: Hector Booker MRN: [id number] DOB: 03/24/1961 Today's Date: 12/24/2023 Time: 0820-0829 SLP Time Calculation (min) (ACUTE ONLY): 9 min  Assessment / Plan / Recommendation Clinical Impression  SLP conducted skilled therapy session targeting dysphagia management goals. Upon SLP entry, patient consuming thin liquids from straw while partially reclined in bed. Unclear who provided thin liquids, but patient reports that he refuses any thickened liquids, as they are "disgusting" and they "will kill him." Patient endorses that he does not understand why he was placed on thickened liquids. SLP provided education via pulling up MBS imaging, alerting patient to the presence of aspiration, however patient reports that since he "coughs up phlegm", he is fine. Patient not receptive to information provided re: the risks of frequent recurrent aspiration events. Throughout session, patient consumed medications crushed and whole in puree without difficulty, one sip of honey thick liquids, and several straw sips of thin liquids despite discouragement from SLP with no overt s/sx of difficulty. Safest recommendation continues to be Dys1/HTL diet, however suspect patient will continue to be noncompliant with recommendations, especially at next venue of care. SLP will continue to follow for education to encourage adherence to recommendations.    HPI HPI: Pt is a 63 y/o M presenting to ED with weakness, slurred speech, and trouble swallowing after sustaining fall at home. MRI revealed acute infarcts in the left pons and high left frontal white matter. CXR concerning for RLL PNA. CTA showed asymmetric fullness of the R vocal cord. 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      SLP Plan  Continue with current plan of care      Recommendations for follow up therapy are one component of a  multi-disciplinary discharge planning process, led by the attending physician.  Recommendations may be updated based on patient status, additional functional criteria and insurance authorization.    Recommendations  Diet recommendations: Dysphagia 1 (puree);Honey-thick liquid Liquids provided via: Cup;No straw Medication Administration: Crushed with puree Supervision: Patient able to self feed;Full supervision/cueing for compensatory strategies Compensations: Minimize environmental distractions;Slow rate;Small sips/bites Postural Changes and/or Swallow Maneuvers: Seated upright 90 degrees                  Oral care QID   Frequent or constant Supervision/Assistance Dysphagia, oropharyngeal phase (R13.12)     Continue with current plan of care     Yetta Barre  12/24/2023, 9:27 AM

## 2023-12-24 NOTE — Progress Notes (Signed)
Patient recently admitted with an acute ischemic CVA, felt to be embolic. Neurology requested a 30 day cardiac monitor to rule out atrial fibrillation. I have placed the order and messaged our office to arrange. Study will be read by Dr. Odis Hollingshead (DOD), as he is not an established patient with our practice.   Arranged new patient appointment for 02/16/24  Jonita Albee, PA-C 12/24/2023 8:32 AM

## 2023-12-24 NOTE — TOC Transition Note (Signed)
Transition of Care [facility]) - Discharge Note   Patient Details  Name: Hector Booker MRN: [id number] Date of Birth: 03-Jul-1961  Transition of Care [facility]) CM/SW Contact:  Janae Bridgeman, RN Phone Number: 12/24/2023, 10:53 AM   Clinical Narrative:    Sharin Mons was called for transport to  Hospital SNF.  Bedside nursing is aware.  Patient is not eligible for discharge lounge.  Bedside nursing was asked to call nursing report to [facility].         Patient Goals and CMS Choice            Discharge Placement                       Discharge Plan and Services Additional resources added to the After Visit Summary for                                       Social Drivers of Health (SDOH) Interventions SDOH Screenings   Food Insecurity: No Food Insecurity (12/22/2023)  Housing: High Risk (12/22/2023)  Transportation Needs: No Transportation Needs (12/22/2023)  Utilities: Not At Risk (12/22/2023)  Tobacco Use: High Risk (12/17/2023)     Readmission Risk Interventions     No data to display

## 2023-12-24 NOTE — Evaluation (Signed)
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) 12/18/2023   Aspiration pneumonia (HCC) 12/17/2023   History of CVA with residual deficit of the left side 12/17/2023   Fall at home, initial encounter 12/17/2023   Dysphagia 12/17/2023   Nondisplaced fracture of left radial head fracture 12/17/2023   Knee osteoarthritis 12/17/2023   Dysarthria 12/17/2023   Hypokalemia 12/17/2023   Ischemic stroke (HCC) 10/31/2021   Essential hypertension 08/31/2017   History of cocaine use 08/31/2017   History of alcohol abuse 08/31/2017   Neuropathy 02/18/2017   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) 12/25/2016   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   Vertebral artery occlusion, 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   Hyperlipidemia 12/02/2016   Smoker 12/02/2016   Left pontine stroke (HCC) s/p tPA 12/01/2016   Past Medical History:  Past Medical History:  Diagnosis Date   Alcohol abuse 12/04/2016   Arthritis    Cocaine abuse (HCC) 12/02/2016   Hyperlipemia    Stroke [facility])    Past Surgical History:  Past Surgical History:  Procedure Laterality Date   IR GENERIC HISTORICAL  12/02/2016   IR ANGIO VERTEBRAL SEL VERTEBRAL BILAT MOD SED 12/02/2016 Julieanne Cotton, MD [facility]   IR GENERIC HISTORICAL  12/02/2016   IR ANGIO INTRA EXTRACRAN SEL COM CAROTID INNOMINATE BILAT MOD SED 12/02/2016 Julieanne Cotton, MD [facility]   LOWER EXTREMITY ANGIOGRAPHY N/A 07/29/2019   Procedure: LOWER EXTREMITY ANGIOGRAPHY;  Surgeon: Chuck Hint, MD;  Location: [facility] INVASIVE CV LAB;  Service: Cardiovascular;  Laterality: N/A;   HPI:  Pt is a 63 y/o M  presenting to ED with weakness, slurred speech, and trouble swallowing after sustaining fall at home. MRI revealed acute infarcts in the left pons and high left frontal white matter. CXR concerning for RLL PNA. CTA showed asymmetric fullness of the R vocal cord. 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   Assessment / Plan / Recommendation Clinical Impression  Patient presents with deficits in the areas of processing speed, delayed recall, selective attention, and working memory resulting in score of 21/30 on SLUMS examination and as evidenced by deficits noted during functional patient interview. Receptive/expressive language and motor speech appear to be [facility] and at patient's baseline. Patient reports that he is at baseline level of functioning, however family not available for corroboration. SLP and patient discussed safe swallowing recommendations during previous treatment session, during which patient demonstrated reduced safety awareness and vehemently denied any education re: safe swallowing precautions. Patient benefited from reduction of distractions throughout session to improve attention to task. Patient would benefit from ongoing SLP services at next venue of care to target cognitive deficits.    SLP Assessment  SLP Recommendation/Assessment: Patient needs continued Speech Lanaguage Pathology Services SLP Visit Diagnosis: Cognitive communication deficit (R41.841)    Recommendations for follow up therapy are one component of a multi-disciplinary discharge planning process, led by the attending physician.  Recommendations may be updated based on patient status, additional functional criteria and insurance authorization.    Follow Up Recommendations  Skilled nursing-short term rehab (<3 hours/day)    Assistance Recommended at Discharge  Frequent or  constant Supervision/Assistance  Functional Status Assessment Patient has  had a recent decline in their functional status and demonstrates the ability to make significant improvements in function in a reasonable and predictable amount of time.  Frequency and Duration min 2x/week  2 weeks      SLP Evaluation Cognition  Overall Cognitive Status: Impaired/Different from baseline Arousal/Alertness: Awake/alert Orientation Level: Oriented X4 Year: 2025 Month: February Day of Week: Correct Attention: Selective Selective Attention: Impaired Selective Attention Impairment: Functional basic;Verbal basic Memory: Impaired Memory Impairment: Retrieval deficit;Storage deficit Awareness: Impaired Awareness Impairment: Intellectual impairment;Emergent impairment;Anticipatory impairment Problem Solving: Impaired Problem Solving Impairment: Verbal basic;Functional basic Executive Function: Self Monitoring;Self Correcting Self Monitoring: Impaired Self Monitoring Impairment: Functional basic Self Correcting: Impaired Self Correcting Impairment: Functional basic Safety/Judgment: Impaired       Comprehension  Auditory Comprehension Overall Auditory Comprehension: Appears within functional limits for tasks assessed    Expression Expression Primary Mode of Expression: Verbal Verbal Expression Overall Verbal Expression: Appears within functional limits for tasks assessed   Oral / Motor  Oral Motor/Sensory Function Overall Oral Motor/Sensory Function: Within functional limits Motor Speech Overall Motor Speech: Appears within functional limits for tasks assessed           Jeannie Done, M.A., CCC-SLP  Yetta Barre 12/24/2023, 9:41 AM

## 2023-12-29 DIAGNOSIS — I639 Cerebral infarction, unspecified: Secondary | ICD-10-CM

## 2024-01-11 ENCOUNTER — Ambulatory Visit: Attending: Cardiology

## 2024-01-11 DIAGNOSIS — I639 Cerebral infarction, unspecified: Secondary | ICD-10-CM

## 2024-02-16 ENCOUNTER — Ambulatory Visit: Payer: Medicaid Other | Attending: Cardiology | Admitting: Cardiology

## 2024-02-23 ENCOUNTER — Other Ambulatory Visit: Payer: Self-pay

## 2024-02-23 ENCOUNTER — Ambulatory Visit: Attending: Family Medicine

## 2024-02-23 ENCOUNTER — Telehealth: Payer: Self-pay

## 2024-02-23 DIAGNOSIS — R2689 Other abnormalities of gait and mobility: Secondary | ICD-10-CM | POA: Diagnosis present

## 2024-02-23 DIAGNOSIS — R2681 Unsteadiness on feet: Secondary | ICD-10-CM | POA: Diagnosis present

## 2024-02-23 DIAGNOSIS — R262 Difficulty in walking, not elsewhere classified: Secondary | ICD-10-CM | POA: Diagnosis present

## 2024-02-23 DIAGNOSIS — M6281 Muscle weakness (generalized): Secondary | ICD-10-CM | POA: Insufficient documentation

## 2024-02-23 NOTE — Telephone Encounter (Signed)
 Dr. Lazoff, Hector Booker was evaluated by Physical Therapy on 02/23/24.  The patient would benefit from Occupational and Speech evaluation for deficits following recent hx of CVA.   If you agree, please place an order in [facility] in [facility] or fax the order to [phone]. Thank you, 4:34 PM, 02/23/24 M. Kelly [name], PT, DPT Physical Therapist- [place] Office Number: [phone]   [facility], [facility] [address] [place], [place]  [address] Phone:  [phone] Fax:  [phone]

## 2024-02-23 NOTE — Therapy (Signed)
 OUTPATIENT PHYSICAL THERAPY NEURO EVALUATION   Patient Name: Hector Booker MRN: [id number] DOB:[birth year], 63 y.o., male Today's Date: 02/23/2024   PCP: Tura Gaines, Booker (Retired)//Hector Booker REFERRING PROVIDER: Lazoff, Shawn P, Booker  END OF SESSION:  PT End of Session - 02/23/24 1144     Visit Number 1    Number of Visits 12    Date for PT Re-Evaluation 04/05/24    Authorization Type [place] [employer] UHC    Authorization Time Period 27 visit limit    Authorization - Visit Number 0    Authorization - Number of Visits 27    PT Start Time 1145    PT Stop Time 1230    PT Time Calculation (min) 45 min             Past Medical History:  Diagnosis Date   Alcohol abuse 12/04/2016   Arthritis    Cocaine abuse (HCC) 12/02/2016   Hyperlipemia    Stroke [facility])    Past Surgical History:  Procedure Laterality Date   IR GENERIC HISTORICAL  12/02/2016   IR ANGIO VERTEBRAL SEL VERTEBRAL BILAT MOD SED 12/02/2016 Hector [facility]   IR GENERIC HISTORICAL  12/02/2016   IR ANGIO INTRA EXTRACRAN SEL COM CAROTID INNOMINATE BILAT MOD SED 12/02/2016 [facility]ector [facility]   LOWER EXTREMITY ANGIOGRAPHY N/A 07/29/2019   Procedure: LOWER EXTREMITY ANGIOGRAPHY;  Surgeon: Hector Booker;  Location: [facility] INVASIVE CV LAB;  Service: Cardiovascular;  Laterality: N/A;   Patient Active Problem List   Diagnosis Date Noted   Malnutrition of moderate degree 12/21/2023   Weakness 12/19/2023   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) 12/18/2023   Aspiration pneumonia (HCC) 12/17/2023   History of CVA with residual deficit of the left side 12/17/2023   Fall at home, initial encounter 12/17/2023   Dysphagia 12/17/2023   Nondisplaced fracture of left radial head fracture 12/17/2023   Knee osteoarthritis 12/17/2023   Dysarthria 12/17/2023   Hypokalemia 12/17/2023   Ischemic stroke (HCC) 10/31/2021   Essential hypertension 08/31/2017   History  of cocaine use 08/31/2017   History of alcohol abuse 08/31/2017   Neuropathy 02/18/2017   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) 12/25/2016   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   Vertebral artery occlusion, 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  Had additional CVA in Feb. 2025 which additionally affected LLE and now his LUE.  Reports after hospital he went to SNF x 4 wks for rehab.  Had a AFO for left foot, but this was lost when he was transferred to SNF.  Has been home since beginning of April and has been ambulatory using 4WW. He did fracture his left radial head at some point in Feb but has not f/u with Ortho regarding this.  Ongoing mobility and ADL deficits/limitations Pt accompanied by: self  PERTINENT HISTORY: 63 year old male with history of hypertension, dyslipidemia, GERD, chronic pain, depression, prior CVA with left-sided weakness and footdrop, tobacco abuse, peripheral arterial disease, elevated blood sugars where pt went to the emergency room on 12/17/2023 with weakness, slurred speech, dysphagia, and a fall at home.  PAIN:  Are you having pain? No, left knee OA painful/unstable with transfers and gait.    PRECAUTIONS:  Fall  RED FLAGS: None   WEIGHT BEARING RESTRICTIONS:  unsure regarding his left UE  FALLS: Has patient fallen in last 6 months? Yes. Number of falls 2  LIVING ENVIRONMENT: Lives with: lives with their spouse Lives in: Mobile home Stairs:  3 stairs to enter w/ BHR Has following equipment at home: Otho Blitz - 4 wheeled  PLOF: Independent with basic ADLs and Independent with household mobility with device  PATIENT GOALS: improve walking/balance/mobility  OBJECTIVE:  Note: Objective measures were completed at Evaluation unless otherwise noted.  DIAGNOSTIC FINDINGS: MRI of the brain that showed an acute left punctate and left frontal CVA but patient has CT angio of the head and neck that showed chronically occluded right vertebral artery and severe right P2 vertebral artery stenosis. Patient had a transthoracic echo without significant findings.  FINDINGS: There is mild irregularity of the radial head that may represent a nondisplaced fracture. No additional fracture. Normal alignment, no dislocation. There is a prominent anterior fat pad without definite joint effusion.   IMPRESSION: Irregularity of the radial head may represent a nondisplaced fracture. Consider follow-up exam in 7-10 days  COGNITION: Overall cognitive status: Within functional limits for tasks assessed   SENSATION: Not tested  COORDINATION: Grossly impaired left side.  Significant ROM and motor control deficits LUE  EDEMA:    MUSCLE TONE: LLE: plantarflexor clonus , extensor tone synergy pattern present    DTRs:  Achilles 5+ = Sustained clonus LLE  POSTURE: rounded shoulders, forward head, and flexed trunk   LOWER EXTREMITY ROM:     Active  Right Eval Left Eval  Hip flexion    Hip extension    Hip abduction    Hip adduction    Hip internal rotation    Hip external rotation    Knee flexion    Knee extension    Ankle dorsiflexion 15 4  Ankle plantarflexion    Ankle inversion    Ankle  eversion     (Blank rows = not tested)  LOWER EXTREMITY MMT:    MMT Right Eval Left Eval  Hip flexion 5 3-  Hip extension    Hip abduction 4 2+  Hip adduction 5 3+  Hip internal rotation    Hip external rotation    Knee flexion 5 3  Knee extension 5 3+  Ankle dorsiflexion 5 2  Ankle plantarflexion    Ankle inversion    Ankle eversion    (Blank rows = not tested)  BED MOBILITY:  Not  tested  TRANSFERS: Sit to stand: Modified independence  Assistive device utilized: None     Stand to sit: Modified independence  Assistive device utilized: None     Chair to chair: Modified independence and SBA  Assistive device utilized: UE support        CURB:  Findings: CGA-min A  STAIRS: Findings: Comments: CGA-min A GAIT: Findings: Gait Characteristics: Left steppage, scissoring, and poor foot clearance- Left, Distance walked: 100 ft, and Comments: 4WW LLE dropping across mid line.  SBA ambulation level surfaces  FUNCTIONAL TESTS:  5 times sit to stand: 42 sec for 3 reps Timed up and go (TUG): 50.25 sec w/ 4QI (no AFO); 45 sec w/ AFO Berg Balance Scale: 36/56                                                                                                                                TREATMENT DATE: 02/23/24    PATIENT EDUCATION: Education details: assessment details, HEP initiation. Benefit of OT and ST services Person educated: Patient Education method: Explanation and Handouts Education comprehension: verbalized understanding  HOME EXERCISE PROGRAM: Access Code: [email or web address] URL: [email or web address] Date: 02/23/2024 Prepared by: Kelly Mahoganie Basher  Exercises - Seated Hip Abduction with Resistance  - 1 x daily - 7 x weekly - 3 sets - 10 reps - 2 sec hold hold  GOALS: Goals reviewed with patient? Yes  SHORT TERM GOALS: Target date: 03/15/2024    Patient will be independent in HEP to improve functional outcomes Baseline: Goal status: INITIAL  2.  Demo  improved BLE strength and reduced risk for falls per time 30 sec 5xSTS test Baseline: 3 reps at 40+ sec Goal status: INITIAL  3.  Demo improved independence and safety with ambulation modified independence level surfaces x 150 ft Baseline: SBA Goal status: INITIAL  4.  Demo improved safety and mobility navigating stairs with set-up assist for improved safety entering/exiting home Baseline: CGA-min A Goal status: INITIAL    LONG TERM GOALS: Target date: 04/05/2024    Demo improved left hip abduction strength to 3+/5 to improve single limb support for safer turning Baseline: 2+/5 Goal status: INITIAL  2.  Demo lower risk for falls per time 25 sec TUG test using least restrictive AD Baseline: 50 sec w/ 3KV Goal status: INITIAL  3.  Demo low risk for falls per score 45/56 Berg Balance Test to improve safety with ADL Baseline: 36/56 Goal status: INITIAL  4.  Demo modified independence with ambulation uneven surfaces to improve safety in community Baseline: CGA Goal status: INITIAL  5.  Increase gait speed to 2.8 ft/sec using least restrictive AD to improve efficiency of community mobility Baseline: TBD Goal status: INITIAL    ASSESSMENT:  CLINICAL IMPRESSION: Patient is a 63 y.o. male who was seen today for physical therapy evaluation and treatment for deficits and limitations following CVA.  Exhibits profound left-sided deficits affecting his LUE and LLE with general weakness  to LLE and extensor tone synergy present with left foot drop and hip weakness exhibiting ipsilateral scissoring gait pattern with steppage due to foot drop. High risk for falls on all outcome measures assessed and requiring SBA-CGA for ambulation and CGA-min A for stair negotiation.  Patient would benefit from PT services to develop and instruct in relevant restorative activities/measures as well as provide needed compensatory and adaptive strategies to improve  functional independence and mobility as well as  reducing risk for falls.    OBJECTIVE IMPAIRMENTS: Abnormal gait, decreased activity tolerance, decreased balance, decreased coordination, decreased endurance, decreased knowledge of use of DME, decreased mobility, decreased ROM, decreased strength, impaired tone, improper body mechanics, postural dysfunction, and pain.   ACTIVITY LIMITATIONS: carrying, lifting, bending, standing, squatting, stairs, transfers, reach over head, and locomotion level  PARTICIPATION LIMITATIONS: meal prep, cleaning, laundry, interpersonal relationship, driving, shopping, community activity, occupation, and yard work  PERSONAL FACTORS: Age, Time since onset of injury/illness/exacerbation, and 1-2 comorbidities: hx of CVA, HTN  are also affecting patient's functional outcome.   REHAB POTENTIAL: Good  CLINICAL DECISION MAKING: Evolving/moderate complexity  EVALUATION COMPLEXITY: Moderate  PLAN:  PT FREQUENCY: 1-2x/week  PT DURATION: 6 weeks  PLANNED INTERVENTIONS: 97750- Physical Performance Testing, 97110-Therapeutic exercises, 97530- Therapeutic activity, [id number]- Neuromuscular re-education, 97535- Self Care, 16109- Manual therapy, [id number]- Gait training, [phone]- Orthotic Initial, [id number]- Orthotic/Prosthetic subsequent, [phone]- Canalith repositioning, [id number]- Aquatic Therapy, B1478- Electrical stimulation (unattended), and DME instructions  PLAN FOR NEXT SESSION: gait speed and goal, HEP review and progression, sent request to Booker for OT/ST   12:55 PM, 02/23/24 M. Kelly Siriah Treat, PT, DPT Physical Therapist- [place] Office Number: [phone]   For all possible CPT codes, reference the Planned Interventions line above.     Check all conditions that are expected to impact treatment: {Conditions expected to impact treatment:Musculoskeletal disorders and Social determinants of health   If treatment provided at initial evaluation, no treatment charged due to lack of authorization.

## 2024-02-29 ENCOUNTER — Ambulatory Visit

## 2024-02-29 DIAGNOSIS — R262 Difficulty in walking, not elsewhere classified: Secondary | ICD-10-CM

## 2024-02-29 DIAGNOSIS — M6281 Muscle weakness (generalized): Secondary | ICD-10-CM

## 2024-02-29 DIAGNOSIS — R2689 Other abnormalities of gait and mobility: Secondary | ICD-10-CM

## 2024-02-29 DIAGNOSIS — R2681 Unsteadiness on feet: Secondary | ICD-10-CM

## 2024-02-29 NOTE — Therapy (Signed)
 OUTPATIENT PHYSICAL THERAPY NEURO TREATMENT   Patient Name: Hector Booker MRN: [id number] DOB:02-24-1961, 63 y.o., male Today's Date: 02/29/2024   PCP: Tura Gaines, MD (Retired)//Lazoff, Arsenio Larger, DO REFERRING PROVIDER: Orlena Bitters, DO  END OF SESSION:  PT End of Session - 02/29/24 1022     Visit Number 2    Number of Visits 12    Date for PT Re-Evaluation 04/05/24    Authorization Type [place] Medicaid UHC    Authorization Time Period 27 visit limit    Authorization - Visit Number 1    Authorization - Number of Visits 27    PT Start Time 1015    PT Stop Time 1100    PT Time Calculation (min) 45 min             Past Medical History:  Diagnosis Date   Alcohol abuse 12/04/2016   Arthritis    Cocaine abuse (HCC) 12/02/2016   Hyperlipemia    Stroke [facility])    Past Surgical History:  Procedure Laterality Date   IR GENERIC HISTORICAL  12/02/2016   IR ANGIO VERTEBRAL SEL VERTEBRAL BILAT MOD SED 12/02/2016 Luellen Sages, MD [facility]   IR GENERIC HISTORICAL  12/02/2016   IR ANGIO INTRA EXTRACRAN SEL COM CAROTID INNOMINATE BILAT MOD SED 12/02/2016 Luellen Sages, MD [facility]   LOWER EXTREMITY ANGIOGRAPHY N/A 07/29/2019   Procedure: LOWER EXTREMITY ANGIOGRAPHY;  Surgeon: [name], MD;  Location: [facility] INVASIVE CV LAB;  Service: Cardiovascular;  Laterality: N/A;   Patient Active Problem List   Diagnosis Date Noted   Malnutrition of moderate degree 12/21/2023   Weakness 12/19/2023   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) 12/18/2023   Aspiration pneumonia (HCC) 12/17/2023   History of CVA with residual deficit of the left side 12/17/2023   Fall at home, initial encounter 12/17/2023   Dysphagia 12/17/2023   Nondisplaced fracture of left radial head fracture 12/17/2023   Knee osteoarthritis 12/17/2023   Dysarthria 12/17/2023   Hypokalemia 12/17/2023   Ischemic stroke (HCC) 10/31/2021   Essential hypertension 08/31/2017   History of  cocaine use 08/31/2017   History of alcohol abuse 08/31/2017   Neuropathy 02/18/2017   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) 12/25/2016   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  I need a new AFO ASAP. Left knee is a little painful Pt accompanied by: self  PERTINENT HISTORY: 63 year old male with history of hypertension, dyslipidemia, GERD, chronic pain, depression, prior CVA with left-sided weakness and footdrop, tobacco abuse, peripheral arterial disease, elevated blood sugars where pt went to the emergency room on 12/17/2023 with weakness, slurred speech, dysphagia, and a fall at home.  PAIN:  Are you having pain? Yes: NPRS scale: 7/10 Pain location: left knee-lateral compartment and calf Pain description: sore, tight Aggravating factors: weight bearing, stairs Relieving factors: rest, inactivity ,   PRECAUTIONS: Fall  RED FLAGS: None   WEIGHT BEARING RESTRICTIONS:  unsure regarding his left UE  FALLS: Has patient fallen in last 6 months? Yes. Number of falls 2  LIVING ENVIRONMENT: Lives with: lives with their spouse Lives in: Mobile home Stairs:  3 stairs to enter w/ BHR Has following  equipment at home: Otho Blitz - 4 wheeled  PLOF: Independent with basic ADLs and Independent with household mobility with device  PATIENT GOALS: improve walking/balance/mobility  OBJECTIVE:  TODAY'S TREATMENT: 02/29/24 Activity Comments  Vitals: 165/91 mmHg, 71 bpm   NU-step resistance intervals x 8 min 2 min warm-up 30 sec heavy; 30 sec light--descending resistance  Seated hamstring curls 2x10 Green loop, sliding foot on washcloth and self-palpation of hamstring tendons for feedback  Seated hip abd w/ resist 2x10 Green loop  Standing at counter RLE elevated to cabinet 2x15 sec For LLE weightbearing, cues in extension for back, hip, knee  Seated hamstring stretch 3x30 sec       PATIENT EDUCATION: Education details: assessment details, HEP initiation. Benefit of OT and ST services Person educated: Patient Education method: Explanation and Handouts Education comprehension: verbalized understanding  HOME EXERCISE PROGRAM: Access Code: [email or web address] URL: [email or web address] Date: 02/23/2024 Prepared by: Kelly Jaquae Rieves  Exercises - Seated Hip Abduction with Resistance  - 1 x daily - 7 x weekly - 3 sets - 10 reps - 2 sec hold hold   - Seated Hamstring Curls with Resistance  - 1 x daily - 7 x weekly - 3 sets - 10 reps - Seated Hamstring Stretch  - 1 x daily - 7 x weekly - 3 sets - 30 sec hold - Standing Forward Step Taps with Counter Support  - 1 x daily - 7 x weekly - 3 sets - 15 sec hold  Note: Objective measures were completed at Evaluation unless otherwise noted.  DIAGNOSTIC FINDINGS: MRI of the brain that showed an acute left punctate and left frontal CVA but patient has CT angio of the head and neck that showed chronically occluded right vertebral artery and severe right P2 vertebral artery stenosis. Patient had a transthoracic echo without significant findings.  FINDINGS: There is mild irregularity of the radial head that may represent a nondisplaced fracture. No  additional fracture. Normal alignment, no dislocation. There is a prominent anterior fat pad without definite joint effusion.   IMPRESSION: Irregularity of the radial head may represent a nondisplaced fracture. Consider follow-up exam in 7-10 days  COGNITION: Overall cognitive status: Within functional limits for tasks assessed   SENSATION: Not tested  COORDINATION: Grossly impaired left side.  Significant ROM and motor control deficits LUE  EDEMA:    MUSCLE TONE: LLE: plantarflexor clonus , extensor tone synergy pattern present    DTRs:  Achilles 5+ = Sustained clonus LLE  POSTURE: rounded shoulders, forward head, and flexed trunk   LOWER EXTREMITY ROM:  Active  Right Eval Left Eval  Hip flexion    Hip extension    Hip abduction    Hip adduction    Hip internal rotation    Hip external rotation    Knee flexion    Knee extension    Ankle dorsiflexion 15 4  Ankle plantarflexion    Ankle inversion    Ankle eversion     (Blank rows = not tested)  LOWER EXTREMITY MMT:    MMT Right Eval Left Eval  Hip flexion 5 3-  Hip extension    Hip abduction 4 2+  Hip adduction 5 3+  Hip internal rotation    Hip external rotation    Knee flexion 5 3  Knee extension 5 3+  Ankle dorsiflexion 5 2  Ankle plantarflexion    Ankle inversion    Ankle eversion    (Blank rows = not tested)  BED MOBILITY:  Not tested  TRANSFERS: Sit to stand: Modified independence  Assistive device utilized: None     Stand to sit: Modified independence  Assistive device utilized: None     Chair to chair: Modified independence and SBA  Assistive device utilized: UE support        CURB:  Findings: CGA-min A  STAIRS: Findings: Comments: CGA-min A GAIT: Findings: Gait Characteristics: Left steppage, scissoring, and poor foot clearance- Left, Distance walked: 100 ft, and Comments: 4WW LLE dropping across mid line.  SBA ambulation level surfaces  FUNCTIONAL TESTS:  5 times sit to  stand: 42 sec for 3 reps Timed up and go (TUG): 50.25 sec w/ 1OX (no AFO); 45 sec w/ AFO Berg Balance Scale: 36/56                                                                                                                                TREATMENT DATE: 02/23/24      GOALS: Goals reviewed with patient? Yes  SHORT TERM GOALS: Target date: 03/15/2024    Patient will be independent in HEP to improve functional outcomes Baseline: Goal status: INITIAL  2.  Demo improved BLE strength and reduced risk for falls per time 30 sec 5xSTS test Baseline: 3 reps at 40+ sec Goal status: INITIAL  3.  Demo improved independence and safety with ambulation modified independence level surfaces x 150 ft Baseline: SBA Goal status: INITIAL  4.  Demo improved safety and mobility navigating stairs with set-up assist for improved safety entering/exiting home Baseline: CGA-min A Goal status: INITIAL    LONG TERM GOALS: Target date: 04/05/2024    Demo improved left hip abduction strength to 3+/5 to improve single limb support for safer turning Baseline: 2+/5 Goal status: INITIAL  2.  Demo lower risk for falls per time 25 sec TUG test using least restrictive AD Baseline: 50 sec w/ 0RU Goal status: INITIAL  3.  Demo low risk for falls per score 45/56 Berg Balance Test to improve safety with  ADL Baseline: 36/56 Goal status: INITIAL  4.  Demo modified independence with ambulation uneven surfaces to improve safety in community Baseline: CGA Goal status: INITIAL  5.  Increase gait speed to 2.8 ft/sec using least restrictive AD to improve efficiency of community mobility Baseline: TBD Goal status: INITIAL    ASSESSMENT:  CLINICAL IMPRESSION: Initiated with NU-step resistance intervals to improve LE gross motor coordination and power development. HEP progression for LE strength and instructed in forced weight bearing to LLE for improved trunk, hip, knee extension for single limb support.   Attempted standing gastroc but unable to support stabilize and used seated hamstring stretch instead for safely improving left knee extension/flexibility. Continued sessions to progress POC details.  Pt needs order for AFO for left foot drop to improve safety with gait and transfers (his was lost when transferring from hospital to SNF)   OBJECTIVE IMPAIRMENTS: Abnormal gait, decreased activity tolerance, decreased balance, decreased coordination, decreased endurance, decreased knowledge of use of DME, decreased mobility, decreased ROM, decreased strength, impaired tone, improper body mechanics, postural dysfunction, and pain.   ACTIVITY LIMITATIONS: carrying, lifting, bending, standing, squatting, stairs, transfers, reach over head, and locomotion level  PARTICIPATION LIMITATIONS: meal prep, cleaning, laundry, interpersonal relationship, driving, shopping, community activity, occupation, and yard work  PERSONAL FACTORS: Age, Time since onset of injury/illness/exacerbation, and 1-2 comorbidities: hx of CVA, HTN  are also affecting patient's functional outcome.   REHAB POTENTIAL: Good  CLINICAL DECISION MAKING: Evolving/moderate complexity  EVALUATION COMPLEXITY: Moderate  PLAN:  PT FREQUENCY: 1-2x/week  PT DURATION: 6 weeks  PLANNED INTERVENTIONS: 97750- Physical Performance Testing, 97110-Therapeutic exercises, 97530- Therapeutic activity, [id number]- Neuromuscular re-education, 97535- Self Care, [id number]- Manual therapy, [id number]- Gait training, [id number]- Orthotic Initial, [id number]- Orthotic/Prosthetic subsequent, [phone]- Canalith repositioning, [id number]- Aquatic Therapy, [id number]- Electrical stimulation (unattended), and DME instructions  PLAN FOR NEXT SESSION: gait speed and goal, HEP review and progression, sent request to MD for OT/ST   10:23 AM, 02/29/24 M. Kelly Harvel Meskill, PT, DPT Physical Therapist- [place] Office Number: [phone]

## 2024-03-04 ENCOUNTER — Telehealth: Payer: Self-pay

## 2024-03-04 ENCOUNTER — Ambulatory Visit: Attending: Family Medicine

## 2024-03-04 DIAGNOSIS — M6281 Muscle weakness (generalized): Secondary | ICD-10-CM | POA: Diagnosis present

## 2024-03-04 DIAGNOSIS — R2689 Other abnormalities of gait and mobility: Secondary | ICD-10-CM | POA: Diagnosis present

## 2024-03-04 DIAGNOSIS — R2681 Unsteadiness on feet: Secondary | ICD-10-CM | POA: Diagnosis present

## 2024-03-04 DIAGNOSIS — R262 Difficulty in walking, not elsewhere classified: Secondary | ICD-10-CM | POA: Diagnosis present

## 2024-03-04 NOTE — Telephone Encounter (Signed)
 Dr. Lazoff, Proctor C. Jay was evaluated by Physical Therapy on 02/23/24.  The patient would benefit from Occupational and Speech evaluation for deficits following recent hx of CVA.   If you agree, please place an order in [email or web address] in [facility] or fax the order to [phone]. Thank you, 4:34 PM, 02/23/24 M. Kelly [name], PT, DPT Physical Therapist- [place] Office Number: [phone]   [facility], [facility] [address] [place], [place]  [address] Phone:  [phone] Fax:  [phone]

## 2024-03-04 NOTE — Therapy (Signed)
 OUTPATIENT PHYSICAL THERAPY NEURO TREATMENT   Patient Name: Hector Booker MRN: [id number] DOB:October 06, 1961, 63 y.o., male Today's Date: 03/04/2024   PCP: Tura Gaines, MD (Retired)//Lazoff, Arsenio Larger, DO REFERRING PROVIDER: Orlena Bitters, DO  END OF SESSION:  PT End of Session - 03/04/24 1027     Visit Number 3    Number of Visits 12    Date for PT Re-Evaluation 04/05/24    Authorization Type [place] Medicaid UHC    Authorization Time Period 27 visit limit    Authorization - Visit Number 3    Authorization - Number of Visits 27    PT Start Time 1020    PT Stop Time 1110    PT Time Calculation (min) 50 min             Past Medical History:  Diagnosis Date   Alcohol abuse 12/04/2016   Arthritis    Cocaine abuse (HCC) 12/02/2016   Hyperlipemia    Stroke [facility])    Past Surgical History:  Procedure Laterality Date   IR GENERIC HISTORICAL  12/02/2016   IR ANGIO VERTEBRAL SEL VERTEBRAL BILAT MOD SED 12/02/2016 Luellen Sages, MD [facility]INTER[facility]   IR GENERIC HISTORICAL  12/02/2016   IR ANGIO INTRA EXTRACRAN SEL COM CAROTID INNOMINATE BILAT MOD SED 12/02/2016 Luellen Sages, MD [facility]   LOWER EXTREMITY ANGIOGRAPHY N/A 07/29/2019   Procedure: LOWER EXTREMITY ANGIOGRAPHY;  Surgeon: [name], MD;  Location: [facility] INVASIVE CV LAB;  Service: Cardiovascular;  Laterality: N/A;   Patient Active Problem List   Diagnosis Date Noted   Malnutrition of moderate degree 12/21/2023   Weakness 12/19/2023   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) 12/18/2023   Aspiration pneumonia (HCC) 12/17/2023   History of CVA with residual deficit of the left side 12/17/2023   Fall at home, initial encounter 12/17/2023   Dysphagia 12/17/2023   Nondisplaced fracture of left radial head fracture 12/17/2023   Knee osteoarthritis 12/17/2023   Dysarthria 12/17/2023   Hypokalemia 12/17/2023   Ischemic stroke (HCC) 10/31/2021   Essential hypertension 08/31/2017   History of  cocaine use 08/31/2017   History of alcohol abuse 08/31/2017   Neuropathy 02/18/2017   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) 12/25/2016   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   Vertebral artery occlusion, 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   Hyperlipidemia 12/02/2016   Smoker 12/02/2016   Left pontine stroke (HCC) s/p tPA 12/01/2016    ONSET DATE: Feb 2025  REFERRING DIAG: R27.0 (ICD-10-CM) - Ataxia, unspecified R53.1 (ICD-10-CM) - Left-sided weakness M21.372 (ICD-10-CM) - Foot drop, left foot  THERAPY DIAG:  Difficulty in walking, not elsewhere classified  Muscle weakness (generalized)  Other abnormalities of gait and mobility  Unsteadiness on feet  Rationale for Evaluation and Treatment: Rehabilitation  SUBJECTIVE:  SUBJECTIVE STATEMENT: Want to try lofstrand crutch Pt accompanied by: self  PERTINENT HISTORY: 63 year old male with history of hypertension, dyslipidemia, GERD, chronic pain, depression, prior CVA with left-sided weakness and footdrop, tobacco abuse, peripheral arterial disease, elevated blood sugars where pt went to the emergency room on 12/17/2023 with weakness, slurred speech, dysphagia, and a fall at home.  PAIN:  Are you having pain? Yes: NPRS scale: 7/10 Pain location: left knee-lateral compartment and calf Pain description: sore, tight Aggravating factors: weight bearing, stairs Relieving factors: rest, inactivity ,   PRECAUTIONS: Fall  RED FLAGS: None   WEIGHT BEARING RESTRICTIONS:  unsure regarding his left UE  FALLS: Has patient fallen in last 6 months? Yes. Number of falls 2  LIVING ENVIRONMENT: Lives with: lives with their spouse Lives in: Mobile home Stairs:  3 stairs to enter w/ BHR Has following equipment at home: Otho Blitz - 4 wheeled  PLOF:  Independent with basic ADLs and Independent with household mobility with device  PATIENT GOALS: improve walking/balance/mobility  OBJECTIVE:  TODAY'S TREATMENT: 03/04/24 Activity Comments  178/101 mmHg, 71 bpm   10 Meter Walk Test 30.38 sec = 1.07 ft/sec w/ 1OX  Gait trials with lofstrand crutch CGA--difficulty with stability due to left knee pain/weakness  Multisensory balance   Quad set 3x10   Hooklying clam shells 1x10 Clamshell+bridge 1x10 Therapist resist  Ankle/foot plantarflexion stretch 2x60 sec Therapist stretching for increased ROM and tone inhibition to improve toe/ankle dorsiflexion  Static hamstring stretch 2x60 sec LLE To increase knee ext ROM     TODAY'S TREATMENT: 02/29/24 Activity Comments  Vitals: 165/91 mmHg, 71 bpm   NU-step resistance intervals x 8 min 2 min warm-up 30 sec heavy; 30 sec light--descending resistance  Seated hamstring curls 2x10 Green loop, sliding foot on washcloth and self-palpation of hamstring tendons for feedback  Seated hip abd w/ resist 2x10 Green loop  Standing at counter RLE elevated to cabinet 2x15 sec For LLE weightbearing, cues in extension for back, hip, knee  Seated hamstring stretch 3x30 sec       PATIENT EDUCATION: Education details: assessment details, HEP initiation. Benefit of OT and ST services Person educated: Patient Education method: Explanation and Handouts Education comprehension: verbalized understanding  HOME EXERCISE PROGRAM: Access Code: [email or web address] URL: [email or web address] Date: 02/23/2024 Prepared by: Kelly Leny Morozov  Exercises - Seated Hip Abduction with Resistance  - 1 x daily - 7 x weekly - 3 sets - 10 reps - 2 sec hold hold   - Seated Hamstring Curls with Resistance  - 1 x daily - 7 x weekly - 3 sets - 10 reps - Seated Hamstring Stretch  - 1 x daily - 7 x weekly - 3 sets - 30 sec hold - Standing Forward Step Taps with Counter Support  - 1 x daily - 7 x weekly - 3 sets - 15 sec hold  Note:  Objective measures were completed at Evaluation unless otherwise noted.  DIAGNOSTIC FINDINGS: MRI of the brain that showed an acute left punctate and left frontal CVA but patient has CT angio of the head and neck that showed chronically occluded right vertebral artery and severe right P2 vertebral artery stenosis. Patient had a transthoracic echo without significant findings.  FINDINGS: There is mild irregularity of the radial head that may represent a nondisplaced fracture. No additional fracture. Normal alignment, no dislocation. There is a prominent anterior fat pad without definite joint effusion.   IMPRESSION: Irregularity of the radial head may represent a nondisplaced fracture. Consider follow-up exam  in 7-10 days  COGNITION: Overall cognitive status: Within functional limits for tasks assessed   SENSATION: Not tested  COORDINATION: Grossly impaired left side.  Significant ROM and motor control deficits LUE  EDEMA:    MUSCLE TONE: LLE: plantarflexor clonus , extensor tone synergy pattern present    DTRs:  Achilles 5+ = Sustained clonus LLE  POSTURE: rounded shoulders, forward head, and flexed trunk   LOWER EXTREMITY ROM:     Active  Right Eval Left Eval  Hip flexion    Hip extension    Hip abduction    Hip adduction    Hip internal rotation    Hip external rotation    Knee flexion    Knee extension    Ankle dorsiflexion 15 4  Ankle plantarflexion    Ankle inversion    Ankle eversion     (Blank rows = not tested)  LOWER EXTREMITY MMT:    MMT Right Eval Left Eval  Hip flexion 5 3-  Hip extension    Hip abduction 4 2+  Hip adduction 5 3+  Hip internal rotation    Hip external rotation    Knee flexion 5 3  Knee extension 5 3+  Ankle dorsiflexion 5 2  Ankle plantarflexion    Ankle inversion    Ankle eversion    (Blank rows = not tested)  BED MOBILITY:  Not tested  TRANSFERS: Sit to stand: Modified independence  Assistive device utilized:  None     Stand to sit: Modified independence  Assistive device utilized: None     Chair to chair: Modified independence and SBA  Assistive device utilized: UE support        CURB:  Findings: CGA-min A  STAIRS: Findings: Comments: CGA-min A GAIT: Findings: Gait Characteristics: Left steppage, scissoring, and poor foot clearance- Left, Distance walked: 100 ft, and Comments: 4WW LLE dropping across mid line.  SBA ambulation level surfaces  FUNCTIONAL TESTS:  5 times sit to stand: 42 sec for 3 reps Timed up and go (TUG): 50.25 sec w/ 1OX (no AFO); 45 sec w/ AFO Berg Balance Scale: 36/56                                                                                                                                TREATMENT DATE: 02/23/24      GOALS: Goals reviewed with patient? Yes  SHORT TERM GOALS: Target date: 03/15/2024    Patient will be independent in HEP to improve functional outcomes Baseline: Goal status: INITIAL  2.  Demo improved BLE strength and reduced risk for falls per time 30 sec 5xSTS test Baseline: 3 reps at 40+ sec Goal status: INITIAL  3.  Demo improved independence and safety with ambulation modified independence level surfaces x 150 ft Baseline: SBA Goal status: INITIAL  4.  Demo improved safety and mobility navigating stairs with set-up assist for improved safety entering/exiting home Baseline: CGA-min A Goal status:  INITIAL    LONG TERM GOALS: Target date: 04/05/2024    Demo improved left hip abduction strength to 3+/5 to improve single limb support for safer turning Baseline: 2+/5 Goal status: INITIAL  2.  Demo lower risk for falls per time 25 sec TUG test using least restrictive AD Baseline: 50 sec w/ 1OX Goal status: INITIAL  3.  Demo low risk for falls per score 45/56 Berg Balance Test to improve safety with ADL Baseline: 36/56 Goal status: INITIAL  4.  Demo modified independence with ambulation uneven surfaces to improve safety in  community Baseline: CGA Goal status: INITIAL  5.  Increase gait speed to 2.8 ft/sec using least restrictive AD to improve efficiency of community mobility Baseline: TBD Goal status: INITIAL    ASSESSMENT:  CLINICAL IMPRESSION: Ongoing issues of LLE weakness and knee pain from OA with flexed knee posturing from spasticity/knee pain/quad weakness with reduced gait speed of 1 ft/sec w/ 0RU. Trials of gait w/ lofstrand crutch per request but unable to tolerate due to left knee dysfunction.  Multisensory balance activities to improve postural stability/spatial awareness requiring light UE support for maintaining balance. Performed mat table exercises for improved LLE strength/ROM/facilitation with therapist tactile cues for recruitment with improved carryover. Static stretching to LLE for tone inhibition and improved ROM. Continued sessions to advance POC details to improve mobility and increase functional independence.   OBJECTIVE IMPAIRMENTS: Abnormal gait, decreased activity tolerance, decreased balance, decreased coordination, decreased endurance, decreased knowledge of use of DME, decreased mobility, decreased ROM, decreased strength, impaired tone, improper body mechanics, postural dysfunction, and pain.   ACTIVITY LIMITATIONS: carrying, lifting, bending, standing, squatting, stairs, transfers, reach over head, and locomotion level  PARTICIPATION LIMITATIONS: meal prep, cleaning, laundry, interpersonal relationship, driving, shopping, community activity, occupation, and yard work  PERSONAL FACTORS: Age, Time since onset of injury/illness/exacerbation, and 1-2 comorbidities: hx of CVA, HTN  are also affecting patient's functional outcome.   REHAB POTENTIAL: Good  CLINICAL DECISION MAKING: Evolving/moderate complexity  EVALUATION COMPLEXITY: Moderate  PLAN:  PT FREQUENCY: 1-2x/week  PT DURATION: 6 weeks  PLANNED INTERVENTIONS: 97750- Physical Performance Testing, 97110-Therapeutic  exercises, 97530- Therapeutic activity, [id number]- Neuromuscular re-education, 97535- Self Care, [id number]- Manual therapy, [id number]- Gait training, [phone]- Orthotic Initial, [id number]- Orthotic/Prosthetic subsequent, [phone]- Canalith repositioning, [id number]- Aquatic Therapy, N5621- Electrical stimulation (unattended), and DME instructions  PLAN FOR NEXT SESSION: gait speed and goal, HEP review and progression, sent request to MD for OT/ST   11:17 AM, 03/04/24 M. Kelly Trecia Maring, PT, DPT Physical Therapist- [place] Office Number: [phone]

## 2024-03-04 NOTE — Telephone Encounter (Signed)
 Hi Dr. Lazoff,  Hector Booker ([birth year]) is being treated by physical therapy for LLE deficits following CVA.  Hector Booker will benefit from use of LEFT AFO in order to improve safety with functional mobility.    Due to insurance regulations, patients must have a face-to-face visit with a physician within the last 6 months wherein the benefit of bracing has been discussed and documented in the patient file. You may want to have the patient schedule a new appointment with you, or if you feel comfortable you may addend your previous note with the patient, stating that orthotics were discussed.   If you agree, please submit request in EPIC under MD Order, Other Orders (list LEFT AFO in comments) and please include the ICD-10 code, MD signature, and NPI. You may also fax to [facility] at [phone].   Thank you, 11:26 AM, 03/04/24 Hector Booker, PT, DPT Physical Therapist- [place] Office Number: [phone]    [facility] [address] [place], [place]  [address] Phone:  [phone] Fax:  [phone]

## 2024-03-07 ENCOUNTER — Ambulatory Visit

## 2024-03-10 NOTE — Therapy (Signed)
 OUTPATIENT PHYSICAL THERAPY NEURO TREATMENT   Patient Name: OMARRI NGAN MRN: [id number] DOB:1961/05/30, 63 y.o., male Today's Date: 03/11/2024   PCP: Tura Gaines, MD (Retired)//Lazoff, Arsenio Larger, DO REFERRING PROVIDER: Orlena Bitters, DO  END OF SESSION:  PT End of Session - 03/11/24 1048     Visit Number 4    Number of Visits 12    Date for PT Re-Evaluation 04/05/24    Authorization Type [place] Medicaid UHC    Authorization Time Period 27 visit limit    Authorization - Visit Number 4    Authorization - Number of Visits 27    PT Start Time 1018    PT Stop Time 1103    PT Time Calculation (min) 45 min    Equipment Utilized During Treatment Gait belt    Activity Tolerance Patient tolerated treatment well;Patient limited by pain    Behavior During Therapy WFL for tasks assessed/performed              Past Medical History:  Diagnosis Date   Alcohol abuse 12/04/2016   Arthritis    Cocaine abuse (HCC) 12/02/2016   Hyperlipemia    Stroke [facility])    Past Surgical History:  Procedure Laterality Date   IR GENERIC HISTORICAL  12/02/2016   IR ANGIO VERTEBRAL SEL VERTEBRAL BILAT MOD SED 12/02/2016 Luellen Sages, MD [facility]   IR GENERIC HISTORICAL  12/02/2016   IR ANGIO INTRA EXTRACRAN SEL COM CAROTID INNOMINATE BILAT MOD SED 12/02/2016 Luellen Sages, MD [facility]   LOWER EXTREMITY ANGIOGRAPHY N/A 07/29/2019   Procedure: LOWER EXTREMITY ANGIOGRAPHY;  Surgeon: [name], MD;  Location: [facility] INVASIVE CV LAB;  Service: Cardiovascular;  Laterality: N/A;   Patient Active Problem List   Diagnosis Date Noted   Malnutrition of moderate degree 12/21/2023   Weakness 12/19/2023   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) 12/18/2023   Aspiration pneumonia (HCC) 12/17/2023   History of CVA with residual deficit of the left side 12/17/2023   Fall at home, initial encounter 12/17/2023   Dysphagia 12/17/2023   Nondisplaced fracture of left radial head  fracture 12/17/2023   Knee osteoarthritis 12/17/2023   Dysarthria 12/17/2023   Hypokalemia 12/17/2023   Ischemic stroke (HCC) 10/31/2021   Essential hypertension 08/31/2017   History of cocaine use 08/31/2017   History of alcohol abuse 08/31/2017   Neuropathy 02/18/2017   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) 12/25/2016   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   Vertebral artery occlusion, 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   Hyperlipidemia 12/02/2016   Smoker 12/02/2016   Left pontine stroke (HCC) s/p tPA 12/01/2016    ONSET DATE: Feb 2025  REFERRING DIAG: R27.0 (ICD-10-CM) - Ataxia, unspecified R53.1 (ICD-10-CM) - Left-sided weakness M21.372 (ICD-10-CM) - Foot drop, left foot  THERAPY DIAG:  Difficulty in walking, not elsewhere classified  Muscle weakness (generalized)  Other abnormalities of gait and mobility  Unsteadiness on feet  Rationale for Evaluation and Treatment: Rehabilitation  SUBJECTIVE:  SUBJECTIVE STATEMENT: "Okay" When inquiring on pt's L radius fx, he reports that not precautions/recommendations were given and he denies pain.   Pt accompanied by: self  PERTINENT HISTORY: 63 year old male with history of hypertension, dyslipidemia, GERD, chronic pain, depression, prior CVA with left-sided weakness and footdrop, tobacco abuse, peripheral arterial disease, elevated blood sugars where pt went to the emergency room on 12/17/2023 with weakness, slurred speech, dysphagia, and a fall at home.  PAIN:  Are you having pain? Yes: NPRS scale: "good"/10 Pain location: left knee-lateral compartment and calf Pain description: sore, tight Aggravating factors: weight bearing, stairs Relieving factors: rest, inactivity,   PRECAUTIONS: Fall  RED FLAGS: None   WEIGHT BEARING  RESTRICTIONS: unsure regarding his left UE  FALLS: Has patient fallen in last 6 months? Yes. Number of falls 2  LIVING ENVIRONMENT: Lives with: lives with their spouse Lives in: Mobile home Stairs: 3 stairs to enter w/ BHR Has following equipment at home: Otho Blitz - 4 wheeled  PLOF: Independent with basic ADLs and Independent with household mobility with device  PATIENT GOALS: improve walking/balance/mobility  OBJECTIVE:     TODAY'S TREATMENT: 03/11/24 Activity Comments  Vitals at start of session  Pt reports that he has not taken his meds this AM  146/93 mmHg, 69bpm   26M walk With 4WW 36.07 sec (0.91 ft/sec)  in II bars:  Sidestepping 2 trials 3 reps out and back Very shallow mini squat 3x Standing lateral wt shift Pt reported some L knee pain, thus short sitting breaks were required. Discontinued squats d/t L knee pain. Pt required significant verbal and manual cues to shift hips rather than shoulders     Sitting LAQ 2# L, 4# R 2x10 Sitting on airex d/t limited TKE  Green TB clam, 10x  Cues for symmetrical effort  Squat pivot from walker seat>nustep  Mod A; and verbal cues for safe hand placement pt got LEs tangled up and unable to pivot  Nustep L4 x 6 min LEs and R UE Slow; for LE strengthening and ROM     HOME EXERCISE PROGRAM: Access Code: [email or web address] URL: [email or web address] Date: 03/11/2024 Prepared by: [facility]  Exercises - Seated Hip Abduction with Resistance  - 1 x daily - 7 x weekly - 3 sets - 10 reps - 2 sec hold hold - Seated Hamstring Curls with Resistance  - 1 x daily - 7 x weekly - 3 sets - 10 reps - Seated Hamstring Stretch  - 1 x daily - 7 x weekly - 3 sets - 30 sec hold - Standing Forward Step Taps with Counter Support  - 1 x daily - 7 x weekly - 3 sets - 15 sec hold - Side Stepping with Counter Support  - 1 x daily - 5 x weekly - 2 sets - 3 reps - Seated Long Arc Quad with Ankle Weight  - 1 x daily  - 5 x weekly - 2 sets - 10 reps   PATIENT EDUCATION: Education details: HEP update with edu on safety; let pt know of note in his chart that Dr. Lazoff was trying to get in touch with him concerning OT/ST referrals  Person educated: Patient Education method: Explanation, Demonstration, Tactile cues, Verbal cues, and Handouts Education comprehension: verbalized understanding and returned demonstration   Note: Objective measures were completed at Evaluation unless otherwise noted.  DIAGNOSTIC FINDINGS: MRI of the brain that showed an acute left punctate and left frontal CVA but patient has  CT angio of the head and neck that showed chronically occluded right vertebral artery and severe right P2 vertebral artery stenosis. Patient had a transthoracic echo without significant findings.  FINDINGS: There is mild irregularity of the radial head that may represent a nondisplaced fracture. No additional fracture. Normal alignment, no dislocation. There is a prominent anterior fat pad without definite joint effusion.   IMPRESSION: Irregularity of the radial head may represent a nondisplaced fracture. Consider follow-up exam in 7-10 days  COGNITION: Overall cognitive status: Within functional limits for tasks assessed   SENSATION: Not tested  COORDINATION: Grossly impaired left side.  Significant ROM and motor control deficits LUE  EDEMA:    MUSCLE TONE: LLE: plantarflexor clonus, extensor tone synergy pattern present    DTRs:  Achilles 5+ = Sustained clonus LLE  POSTURE: rounded shoulders, forward head, and flexed trunk   LOWER EXTREMITY ROM:     Active  Right Eval Left Eval  Hip flexion    Hip extension    Hip abduction    Hip adduction    Hip internal rotation    Hip external rotation    Knee flexion    Knee extension    Ankle dorsiflexion 15 4  Ankle plantarflexion    Ankle inversion    Ankle eversion     (Blank rows = not tested)  LOWER EXTREMITY MMT:    MMT  Right Eval Left Eval  Hip flexion 5 3-  Hip extension    Hip abduction 4 2+  Hip adduction 5 3+  Hip internal rotation    Hip external rotation    Knee flexion 5 3  Knee extension 5 3+  Ankle dorsiflexion 5 2  Ankle plantarflexion    Ankle inversion    Ankle eversion    (Blank rows = not tested)  BED MOBILITY:  Not tested  TRANSFERS: Sit to stand: Modified independence  Assistive device utilized: None     Stand to sit: Modified independence  Assistive device utilized: None     Chair to chair: Modified independence and SBA  Assistive device utilized: UE support        CURB:  Findings: CGA-min A  STAIRS: Findings: Comments: CGA-min A GAIT: Findings: Gait Characteristics: Left steppage, scissoring, and poor foot clearance- Left, Distance walked: 100 ft, and Comments: 4WW LLE dropping across mid line.  SBA ambulation level surfaces  FUNCTIONAL TESTS:  5 times sit to stand: 42 sec for 3 reps Timed up and go (TUG): 50.25 sec w/ 1OX (no AFO); 45 sec w/ AFO Berg Balance Scale: 36/56                                                                                                                                TREATMENT DATE: 02/23/24      GOALS: Goals reviewed with patient? Yes  SHORT TERM GOALS: Target date: 03/15/2024    Patient will be independent in HEP to improve  functional outcomes Baseline: Goal status: INITIAL  2.  Demo improved BLE strength and reduced risk for falls per time 30 sec 5xSTS test Baseline: 3 reps at 40+ sec Goal status: INITIAL  3.  Demo improved independence and safety with ambulation modified independence level surfaces x 150 ft Baseline: SBA Goal status: INITIAL  4.  Demo improved safety and mobility navigating stairs with set-up assist for improved safety entering/exiting home Baseline: CGA-min A Goal status: INITIAL    LONG TERM GOALS: Target date: 04/05/2024    Demo improved left hip abduction strength to 3+/5 to improve single  limb support for safer turning Baseline: 2+/5 Goal status: INITIAL  2.  Demo lower risk for falls per time 25 sec TUG test using least restrictive AD Baseline: 50 sec w/ 9FA Goal status: INITIAL  3.  Demo low risk for falls per score 45/56 Berg Balance Test to improve safety with ADL Baseline: 36/56 Goal status: INITIAL  4.  Demo modified independence with ambulation uneven surfaces to improve safety in community Baseline: CGA Goal status: INITIAL  5.  Increase gait speed to 2.8 ft/sec using least restrictive AD to improve efficiency of community mobility Baseline: 0.91 ft/sec 03/11/24 Goal status: IN PROGRESS 03/11/24    ASSESSMENT:  CLINICAL IMPRESSION: Patient arrived to session without complaints. Denies being given precautions/recommendations for the L radial fx and he denies pain. Patient demonstrated a gait speed of 0.62ft/sec with 4WW today. Reported L knee pain with standing tasks, thus worked on sitting resisted ther-ex in sitting instead. Patient is unsafe with transfers and limited stability with gait with 4WW as well. Will need continued edu on safety and work on balance as tolerated.   OBJECTIVE IMPAIRMENTS: Abnormal gait, decreased activity tolerance, decreased balance, decreased coordination, decreased endurance, decreased knowledge of use of DME, decreased mobility, decreased ROM, decreased strength, impaired tone, improper body mechanics, postural dysfunction, and pain.   ACTIVITY LIMITATIONS: carrying, lifting, bending, standing, squatting, stairs, transfers, reach over head, and locomotion level  PARTICIPATION LIMITATIONS: meal prep, cleaning, laundry, interpersonal relationship, driving, shopping, community activity, occupation, and yard work  PERSONAL FACTORS: Age, Time since onset of injury/illness/exacerbation, and 1-2 comorbidities: hx of CVA, HTN are also affecting patient's functional outcome.   REHAB POTENTIAL: Good  CLINICAL DECISION MAKING:  Evolving/moderate complexity  EVALUATION COMPLEXITY: Moderate  PLAN:  PT FREQUENCY: 1-2x/week  PT DURATION: 6 weeks  PLANNED INTERVENTIONS: 97750- Physical Performance Testing, 97110-Therapeutic exercises, 97530- Therapeutic activity, [id number]- Neuromuscular re-education, 97535- Self Care, 21308- Manual therapy, [phone]- Gait training, [phone]- Orthotic Initial, [phone]- Orthotic/Prosthetic subsequent, [phone]- Canalith repositioning, [id number]- Aquatic Therapy, [id number]- Electrical stimulation (unattended), and DME instructions  PLAN FOR NEXT SESSION:  HEP review and progression, sent request to MD for OT/ST    Thaddeus Filippo, PT, DPT 03/11/24 12:04 PM  [facility] [address] [place], [place] [address] Phone # [phone] Fax # [phone]

## 2024-03-11 ENCOUNTER — Encounter: Payer: Self-pay | Admitting: Physical Therapy

## 2024-03-11 ENCOUNTER — Ambulatory Visit: Admitting: Physical Therapy

## 2024-03-11 DIAGNOSIS — M6281 Muscle weakness (generalized): Secondary | ICD-10-CM

## 2024-03-11 DIAGNOSIS — R262 Difficulty in walking, not elsewhere classified: Secondary | ICD-10-CM | POA: Diagnosis not present

## 2024-03-11 DIAGNOSIS — R2681 Unsteadiness on feet: Secondary | ICD-10-CM

## 2024-03-11 DIAGNOSIS — R2689 Other abnormalities of gait and mobility: Secondary | ICD-10-CM

## 2024-03-15 ENCOUNTER — Ambulatory Visit

## 2024-03-15 DIAGNOSIS — R262 Difficulty in walking, not elsewhere classified: Secondary | ICD-10-CM

## 2024-03-15 DIAGNOSIS — M6281 Muscle weakness (generalized): Secondary | ICD-10-CM

## 2024-03-15 DIAGNOSIS — R2689 Other abnormalities of gait and mobility: Secondary | ICD-10-CM

## 2024-03-15 DIAGNOSIS — R2681 Unsteadiness on feet: Secondary | ICD-10-CM

## 2024-03-15 NOTE — Therapy (Signed)
 OUTPATIENT PHYSICAL THERAPY NEURO TREATMENT   Patient Name: Hector Booker MRN: [id number] DOB:June 28, 1961, 63 y.o., male Today's Date: 03/15/2024   PCP: Tura Gaines, MD (Retired)//Lazoff, Arsenio Larger, DO REFERRING PROVIDER: Orlena Bitters, DO  END OF SESSION:  PT End of Session - 03/15/24 1106     Visit Number 5    Number of Visits 12    Date for PT Re-Evaluation 04/05/24    Authorization Type [place] Medicaid UHC    Authorization Time Period 27 visit limit    Authorization - Visit Number 5    Authorization - Number of Visits 27    PT Start Time 1100    PT Stop Time 1145    PT Time Calculation (min) 45 min    Equipment Utilized During Treatment Gait belt    Activity Tolerance Patient tolerated treatment well;Patient limited by pain    Behavior During Therapy WFL for tasks assessed/performed              Past Medical History:  Diagnosis Date   Alcohol abuse 12/04/2016   Arthritis    Cocaine abuse (HCC) 12/02/2016   Hyperlipemia    Stroke [facility])    Past Surgical History:  Procedure Laterality Date   IR GENERIC HISTORICAL  12/02/2016   IR ANGIO VERTEBRAL SEL VERTEBRAL BILAT MOD SED 12/02/2016 Luellen Sages, MD [facility]   IR GENERIC HISTORICAL  12/02/2016   IR ANGIO INTRA EXTRACRAN SEL COM CAROTID INNOMINATE BILAT MOD SED 12/02/2016 Luellen Sages, MD [facility]   LOWER EXTREMITY ANGIOGRAPHY N/A 07/29/2019   Procedure: LOWER EXTREMITY ANGIOGRAPHY;  Surgeon: [name], MD;  Location: [facility] INVASIVE CV LAB;  Service: Cardiovascular;  Laterality: N/A;   Patient Active Problem List   Diagnosis Date Noted   Malnutrition of moderate degree 12/21/2023   Weakness 12/19/2023   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) 12/18/2023   Aspiration pneumonia (HCC) 12/17/2023   History of CVA with residual deficit of the left side 12/17/2023   Fall at home, initial encounter 12/17/2023   Dysphagia 12/17/2023   Nondisplaced fracture of left radial  head fracture 12/17/2023   Knee osteoarthritis 12/17/2023   Dysarthria 12/17/2023   Hypokalemia 12/17/2023   Ischemic stroke (HCC) 10/31/2021   Essential hypertension 08/31/2017   History of cocaine use 08/31/2017   History of alcohol abuse 08/31/2017   Neuropathy 02/18/2017   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) 12/25/2016   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   Vertebral artery occlusion, 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   Hyperlipidemia 12/02/2016   Smoker 12/02/2016   Left pontine stroke (HCC) s/p tPA 12/01/2016    ONSET DATE: Feb 2025  REFERRING DIAG: R27.0 (ICD-10-CM) - Ataxia, unspecified R53.1 (ICD-10-CM) - Left-sided weakness M21.372 (ICD-10-CM) - Foot drop, left foot  THERAPY DIAG:  Difficulty in walking, not elsewhere classified  Muscle weakness (generalized)  Other abnormalities of gait and mobility  Unsteadiness on feet  Rationale for Evaluation and Treatment: Rehabilitation  SUBJECTIVE:  SUBJECTIVE STATEMENT: Doctor office reached out to inquire about OT/ST.   Pt accompanied by: self  PERTINENT HISTORY: 63 year old male with history of hypertension, dyslipidemia, GERD, chronic pain, depression, prior CVA with left-sided weakness and footdrop, tobacco abuse, peripheral arterial disease, elevated blood sugars where pt went to the emergency room on 12/17/2023 with weakness, slurred speech, dysphagia, and a fall at home.  PAIN:  Are you having pain? Yes: NPRS scale: "good"/10 Pain location: left knee-lateral compartment and calf Pain description: sore, tight Aggravating factors: weight bearing, stairs Relieving factors: rest, inactivity,   PRECAUTIONS: Fall  RED FLAGS: None   WEIGHT BEARING RESTRICTIONS: unsure regarding his left UE  FALLS: Has patient fallen  in last 6 months? Yes. Number of falls 2  LIVING ENVIRONMENT: Lives with: lives with their spouse Lives in: Mobile home Stairs: 3 stairs to enter w/ BHR Has following equipment at home: Otho Blitz - 4 wheeled  PLOF: Independent with basic ADLs and Independent with household mobility with device  PATIENT GOALS: improve walking/balance/mobility  OBJECTIVE:  TODAY'S TREATMENT: 03/15/24 Activity Comments  Seated LE OKC PRE 3x10 -hip add iso -clamshells green loop -hamstring curls w/ green -LAQ 5# (tapping to L quad for facilitation)  Single limb support 5x10 sec RLE elevated on 6" step, therapist stabilizing LLE knee for extension--limited by crepitus and instability  Sidestepping x 2 min Along treadmill rail--good left knee stability maintained  Forwards/backwards x 2 min // bars  Gait -level surfaces w/ 1OX and supervision -stairs w/ BHR and CGA-min A           TODAY'S TREATMENT: 03/11/24 Activity Comments  Vitals at start of session  Pt reports that he has not taken his meds this AM  146/93 mmHg, 69bpm   74M walk With 4WW 36.07 sec (0.91 ft/sec)  in II bars:  Sidestepping 2 trials 3 reps out and back Very shallow mini squat 3x Standing lateral wt shift Pt reported some L knee pain, thus short sitting breaks were required. Discontinued squats d/t L knee pain. Pt required significant verbal and manual cues to shift hips rather than shoulders     Sitting LAQ 2# L, 4# R 2x10 Sitting on airex d/t limited TKE  Green TB clam, 10x  Cues for symmetrical effort  Squat pivot from walker seat>nustep  Mod A; and verbal cues for safe hand placement pt got LEs tangled up and unable to pivot  Nustep L4 x 6 min LEs and R UE Slow; for LE strengthening and ROM     HOME EXERCISE PROGRAM: Access Code: [email or web address] URL: [email or web address] Date: 03/11/2024 Prepared by: [facility] - Outpatient  Rehab - [facility]  Exercises - Seated Hip Abduction with Resistance  - 1 x daily  - 7 x weekly - 3 sets - 10 reps - 2 sec hold hold - Seated Hamstring Curls with Resistance  - 1 x daily - 7 x weekly - 3 sets - 10 reps - Seated Hamstring Stretch  - 1 x daily - 7 x weekly - 3 sets - 30 sec hold - Standing Forward Step Taps with Counter Support  - 1 x daily - 7 x weekly - 3 sets - 15 sec hold - Side Stepping with Counter Support  - 1 x daily - 5 x weekly - 2 sets - 3 reps - Seated Long Arc Quad with Ankle Weight  - 1 x daily - 5 x weekly - 2 sets - 10 reps   PATIENT EDUCATION: Education details: HEP update  with edu on safety; let pt know of note in his chart that Dr. Lazoff was trying to get in touch with him concerning OT/ST referrals  Person educated: Patient Education method: Explanation, Demonstration, Tactile cues, Verbal cues, and Handouts Education comprehension: verbalized understanding and returned demonstration   Note: Objective measures were completed at Evaluation unless otherwise noted.  DIAGNOSTIC FINDINGS: MRI of the brain that showed an acute left punctate and left frontal CVA but patient has CT angio of the head and neck that showed chronically occluded right vertebral artery and severe right P2 vertebral artery stenosis. Patient had a transthoracic echo without significant findings.  FINDINGS: There is mild irregularity of the radial head that may represent a nondisplaced fracture. No additional fracture. Normal alignment, no dislocation. There is a prominent anterior fat pad without definite joint effusion.   IMPRESSION: Irregularity of the radial head may represent a nondisplaced fracture. Consider follow-up exam in 7-10 days  COGNITION: Overall cognitive status: Within functional limits for tasks assessed   SENSATION: Not tested  COORDINATION: Grossly impaired left side.  Significant ROM and motor control deficits LUE  EDEMA:    MUSCLE TONE: LLE: plantarflexor clonus, extensor tone synergy pattern present    DTRs:  Achilles 5+ =  Sustained clonus LLE  POSTURE: rounded shoulders, forward head, and flexed trunk   LOWER EXTREMITY ROM:     Active  Right Eval Left Eval  Hip flexion    Hip extension    Hip abduction    Hip adduction    Hip internal rotation    Hip external rotation    Knee flexion    Knee extension    Ankle dorsiflexion 15 4  Ankle plantarflexion    Ankle inversion    Ankle eversion     (Blank rows = not tested)  LOWER EXTREMITY MMT:    MMT Right Eval Left Eval  Hip flexion 5 3-  Hip extension    Hip abduction 4 2+  Hip adduction 5 3+  Hip internal rotation    Hip external rotation    Knee flexion 5 3  Knee extension 5 3+  Ankle dorsiflexion 5 2  Ankle plantarflexion    Ankle inversion    Ankle eversion    (Blank rows = not tested)  BED MOBILITY:  Not tested  TRANSFERS: Sit to stand: Modified independence  Assistive device utilized: None     Stand to sit: Modified independence  Assistive device utilized: None     Chair to chair: Modified independence and SBA  Assistive device utilized: UE support        CURB:  Findings: CGA-min A  STAIRS: Findings: Comments: CGA-min A GAIT: Findings: Gait Characteristics: Left steppage, scissoring, and poor foot clearance- Left, Distance walked: 100 ft, and Comments: 4WW LLE dropping across mid line.  SBA ambulation level surfaces  FUNCTIONAL TESTS:  5 times sit to stand: 42 sec for 3 reps Timed up and go (TUG): 50.25 sec w/ 0QM (no AFO); 45 sec w/ AFO Berg Balance Scale: 36/56  TREATMENT DATE: 02/23/24      GOALS: Goals reviewed with patient? Yes  SHORT TERM GOALS: Target date: 03/15/2024    Patient will be independent in HEP to improve functional outcomes Baseline: Goal status: MET  2.  Demo improved BLE strength and reduced risk for falls per time 30 sec 5xSTS test Baseline: 3 reps at 40+  sec; 4 reps at 40 sec (03/15/24) Goal status: IN PROGRESS  3.  Demo improved independence and safety with ambulation modified independence level surfaces x 150 ft Baseline: SBA Goal status: IN PROGRESS  4.  Demo improved safety and mobility navigating stairs with set-up assist for improved safety entering/exiting home Baseline: CGA-min A Goal status: IN PROGRESS    LONG TERM GOALS: Target date: 04/05/2024    Demo improved left hip abduction strength to 3+/5 to improve single limb support for safer turning Baseline: 2+/5 Goal status: INITIAL  2.  Demo lower risk for falls per time 25 sec TUG test using least restrictive AD Baseline: 50 sec w/ 3YQ Goal status: INITIAL  3.  Demo low risk for falls per score 45/56 Berg Balance Test to improve safety with ADL Baseline: 36/56 Goal status: INITIAL  4.  Demo modified independence with ambulation uneven surfaces to improve safety in community Baseline: CGA Goal status: INITIAL  5.  Increase gait speed to 2.8 ft/sec using least restrictive AD to improve efficiency of community mobility Baseline: 0.91 ft/sec 03/11/24 Goal status: IN PROGRESS 03/11/24    ASSESSMENT:  CLINICAL IMPRESSION: LE strength training to improve LLE control with use of tapping and visual cues to improved engagement and range for movement. 5xSTS initiated but left knee pain/instability limiting performance but able to increase previous performance by 1-repetition in 40 sec time span.  Gait training activities to improve single limb support and LLE stance phase control with improved performance under dynamic demands such as side stepping vs when in static position w/ left knee buckling experienced.  CGA-min A needed for stair negotiation due to LLE weakness and instability with greater difficulty in descending. Continude sessions to progress POC details to improve safety with functional mobility.    OBJECTIVE IMPAIRMENTS: Abnormal gait, decreased activity tolerance,  decreased balance, decreased coordination, decreased endurance, decreased knowledge of use of DME, decreased mobility, decreased ROM, decreased strength, impaired tone, improper body mechanics, postural dysfunction, and pain.   ACTIVITY LIMITATIONS: carrying, lifting, bending, standing, squatting, stairs, transfers, reach over head, and locomotion level  PARTICIPATION LIMITATIONS: meal prep, cleaning, laundry, interpersonal relationship, driving, shopping, community activity, occupation, and yard work  PERSONAL FACTORS: Age, Time since onset of injury/illness/exacerbation, and 1-2 comorbidities: hx of CVA, HTN are also affecting patient's functional outcome.   REHAB POTENTIAL: Good  CLINICAL DECISION MAKING: Evolving/moderate complexity  EVALUATION COMPLEXITY: Moderate  PLAN:  PT FREQUENCY: 1-2x/week  PT DURATION: 6 weeks  PLANNED INTERVENTIONS: 97750- Physical Performance Testing, 97110-Therapeutic exercises, 97530- Therapeutic activity, [id number]- Neuromuscular re-education, 97535- Self Care, 65784- Manual therapy, [id number]- Gait training, [phone]- Orthotic Initial, [id number]- Orthotic/Prosthetic subsequent, [phone]- Canalith repositioning, [id number]- Aquatic Therapy, L2440- Electrical stimulation (unattended), and DME instructions  PLAN FOR NEXT SESSION:  HEP review and progression, sent request to MD for OT/ST    11:55 AM, 03/15/24 M. Kelly Linh Hedberg, PT, DPT Physical Therapist- [place] Office Number: [phone]

## 2024-03-16 NOTE — Therapy (Incomplete)
 OUTPATIENT PHYSICAL THERAPY NEURO TREATMENT   Patient Name: Hector Booker MRN: [id number] DOB:1961-04-17, 63 y.o., male Today's Date: 03/16/2024   PCP: Tura Gaines, MD (Retired)//Lazoff, Arsenio Larger, DO REFERRING PROVIDER: Orlena Bitters, DO  END OF SESSION:     Past Medical History:  Diagnosis Date   Alcohol abuse 12/04/2016   Arthritis    Cocaine abuse (HCC) 12/02/2016   Hyperlipemia    Stroke [facility])    Past Surgical History:  Procedure Laterality Date   IR GENERIC HISTORICAL  12/02/2016   IR ANGIO VERTEBRAL SEL VERTEBRAL BILAT MOD SED 12/02/2016 Luellen Sages, MD [facility]   IR GENERIC HISTORICAL  12/02/2016   IR ANGIO INTRA EXTRACRAN SEL COM CAROTID INNOMINATE BILAT MOD SED 12/02/2016 Luellen Sages, MD [facility]   LOWER EXTREMITY ANGIOGRAPHY N/A 07/29/2019   Procedure: LOWER EXTREMITY ANGIOGRAPHY;  Surgeon: [name], MD;  Location: [facility] INVASIVE CV LAB;  Service: Cardiovascular;  Laterality: N/A;   Patient Active Problem List   Diagnosis Date Noted   Malnutrition of moderate degree 12/21/2023   Weakness 12/19/2023   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) 12/18/2023   Aspiration pneumonia (HCC) 12/17/2023   History of CVA with residual deficit of the left side 12/17/2023   Fall at home, initial encounter 12/17/2023   Dysphagia 12/17/2023   Nondisplaced fracture of left radial head fracture 12/17/2023   Knee osteoarthritis 12/17/2023   Dysarthria 12/17/2023   Hypokalemia 12/17/2023   Ischemic stroke (HCC) 10/31/2021   Essential hypertension 08/31/2017   History of cocaine use 08/31/2017   History of alcohol abuse 08/31/2017   Neuropathy 02/18/2017   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  Rationale for Evaluation and Treatment: Rehabilitation  SUBJECTIVE:                                                                                                                                                                                             SUBJECTIVE STATEMENT: Doctor office reached out to inquire about OT/ST.   Pt accompanied by: self  PERTINENT HISTORY: 63 year old male with history of hypertension, dyslipidemia, GERD, chronic pain, depression, prior CVA with left-sided weakness and footdrop, tobacco abuse, peripheral arterial disease, elevated  blood sugars where pt went to the emergency room on 12/17/2023 with weakness, slurred speech, dysphagia, and a fall at home.  PAIN:  Are you having pain? Yes: NPRS scale: "good"/10 Pain location: left knee-lateral compartment and calf Pain description: sore, tight Aggravating factors: weight bearing, stairs Relieving factors: rest, inactivity,   PRECAUTIONS: Fall  RED FLAGS: None   WEIGHT BEARING RESTRICTIONS: unsure regarding his left UE  FALLS: Has patient fallen in last 6 months? Yes. Number of falls 2  LIVING ENVIRONMENT: Lives with: lives with their spouse Lives in: Mobile home Stairs: 3 stairs to enter w/ BHR Has following equipment at home: Otho Blitz - 4 wheeled  PLOF: Independent with basic ADLs and Independent with household mobility with device  PATIENT GOALS: improve walking/balance/mobility  OBJECTIVE:   TODAY'S TREATMENT: 03/18/24 Activity Comments                           HOME EXERCISE PROGRAM: Access Code: [email or web address] URL: [email or web address] Date: 03/11/2024 Prepared by: [facility] - Outpatient  Rehab - [facility]  Exercises - Seated Hip Abduction with Resistance  - 1 x daily - 7 x  weekly - 3 sets - 10 reps - 2 sec hold hold - Seated Hamstring Curls with Resistance  - 1 x daily - 7 x weekly - 3 sets - 10 reps - Seated Hamstring Stretch  - 1 x daily - 7 x weekly - 3 sets - 30 sec hold - Standing Forward Step Taps with Counter Support  - 1 x daily - 7 x weekly - 3 sets - 15 sec hold - Side Stepping with Counter Support  - 1 x daily - 5 x weekly - 2 sets - 3 reps - Seated Long Arc Quad with Ankle Weight  - 1 x daily - 5 x weekly - 2 sets - 10 reps   PATIENT EDUCATION: Education details: HEP update with edu on safety; let pt know of note in his chart that Dr. Lazoff was trying to get in touch with him concerning OT/ST referrals  Person educated: Patient Education method: Explanation, Demonstration, Tactile cues, Verbal cues, and Handouts Education comprehension: verbalized understanding and returned demonstration   Note: Objective measures were completed at Evaluation unless otherwise noted.  DIAGNOSTIC FINDINGS: MRI of the brain that showed an acute left punctate and left frontal CVA but patient has CT angio of the head and neck that showed chronically occluded right vertebral artery and severe right P2 vertebral artery stenosis. Patient had a transthoracic echo without significant findings.  FINDINGS: There is mild irregularity of the radial head that may represent a nondisplaced fracture. No additional fracture. Normal alignment, no dislocation. There is a prominent anterior fat pad without definite joint effusion.   IMPRESSION: Irregularity of the radial head may represent a nondisplaced fracture. Consider follow-up exam in 7-10 days  COGNITION: Overall cognitive status: Within functional limits for tasks assessed   SENSATION: Not tested  COORDINATION: Grossly impaired left side.  Significant ROM and motor control deficits LUE  EDEMA:    MUSCLE TONE: LLE: plantarflexor clonus, extensor tone synergy pattern present    DTRs:  Achilles 5+ = Sustained  clonus LLE  POSTURE: rounded shoulders, forward head, and flexed trunk   LOWER EXTREMITY ROM:     Active  Right Eval Left Eval  Hip flexion    Hip extension    Hip abduction    Hip adduction    Hip internal rotation  Hip external rotation    Knee flexion    Knee extension    Ankle dorsiflexion 15 4  Ankle plantarflexion    Ankle inversion    Ankle eversion     (Blank rows = not tested)  LOWER EXTREMITY MMT:    MMT Right Eval Left Eval  Hip flexion 5 3-  Hip extension    Hip abduction 4 2+  Hip adduction 5 3+  Hip internal rotation    Hip external rotation    Knee flexion 5 3  Knee extension 5 3+  Ankle dorsiflexion 5 2  Ankle plantarflexion    Ankle inversion    Ankle eversion    (Blank rows = not tested)  BED MOBILITY:  Not tested  TRANSFERS: Sit to stand: Modified independence  Assistive device utilized: None     Stand to sit: Modified independence  Assistive device utilized: None     Chair to chair: Modified independence and SBA  Assistive device utilized: UE support        CURB:  Findings: CGA-min A  STAIRS: Findings: Comments: CGA-min A GAIT: Findings: Gait Characteristics: Left steppage, scissoring, and poor foot clearance- Left, Distance walked: 100 ft, and Comments: 4WW LLE dropping across mid line.  SBA ambulation level surfaces  FUNCTIONAL TESTS:  5 times sit to stand: 42 sec for 3 reps Timed up and go (TUG): 50.25 sec w/ 4UJ (no AFO); 45 sec w/ AFO Berg Balance Scale: 36/56                                                                                                                                TREATMENT DATE: 02/23/24      GOALS: Goals reviewed with patient? Yes  SHORT TERM GOALS: Target date: 03/15/2024    Patient will be independent in HEP to improve functional outcomes Baseline: Goal status: MET  2.  Demo improved BLE strength and reduced risk for falls per time 30 sec 5xSTS test Baseline: 3 reps at 40+ sec; 4 reps  at 40 sec (03/15/24) Goal status: IN PROGRESS  3.  Demo improved independence and safety with ambulation modified independence level surfaces x 150 ft Baseline: SBA Goal status: IN PROGRESS  4.  Demo improved safety and mobility navigating stairs with set-up assist for improved safety entering/exiting home Baseline: CGA-min A Goal status: IN PROGRESS    LONG TERM GOALS: Target date: 04/05/2024    Demo improved left hip abduction strength to 3+/5 to improve single limb support for safer turning Baseline: 2+/5 Goal status: INITIAL  2.  Demo lower risk for falls per time 25 sec TUG test using least restrictive AD Baseline: 50 sec w/ 8JX Goal status: INITIAL  3.  Demo low risk for falls per score 45/56 Berg Balance Test to improve safety with ADL Baseline: 36/56 Goal status: INITIAL  4.  Demo modified independence with ambulation uneven surfaces to improve safety in community Baseline: CGA Goal  status: INITIAL  5.  Increase gait speed to 2.8 ft/sec using least restrictive AD to improve efficiency of community mobility Baseline: 0.91 ft/sec 03/11/24 Goal status: IN PROGRESS 03/11/24    ASSESSMENT:  CLINICAL IMPRESSION: LE strength training to improve LLE control with use of tapping and visual cues to improved engagement and range for movement. 5xSTS initiated but left knee pain/instability limiting performance but able to increase previous performance by 1-repetition in 40 sec time span.  Gait training activities to improve single limb support and LLE stance phase control with improved performance under dynamic demands such as side stepping vs when in static position w/ left knee buckling experienced.  CGA-min A needed for stair negotiation due to LLE weakness and instability with greater difficulty in descending. Continude sessions to progress POC details to improve safety with functional mobility.    OBJECTIVE IMPAIRMENTS: Abnormal gait, decreased activity tolerance, decreased  balance, decreased coordination, decreased endurance, decreased knowledge of use of DME, decreased mobility, decreased ROM, decreased strength, impaired tone, improper body mechanics, postural dysfunction, and pain.   ACTIVITY LIMITATIONS: carrying, lifting, bending, standing, squatting, stairs, transfers, reach over head, and locomotion level  PARTICIPATION LIMITATIONS: meal prep, cleaning, laundry, interpersonal relationship, driving, shopping, community activity, occupation, and yard work  PERSONAL FACTORS: Age, Time since onset of injury/illness/exacerbation, and 1-2 comorbidities: hx of CVA, HTN are also affecting patient's functional outcome.   REHAB POTENTIAL: Good  CLINICAL DECISION MAKING: Evolving/moderate complexity  EVALUATION COMPLEXITY: Moderate  PLAN:  PT FREQUENCY: 1-2x/week  PT DURATION: 6 weeks  PLANNED INTERVENTIONS: 97750- Physical Performance Testing, 97110-Therapeutic exercises, 97530- Therapeutic activity, [id number]- Neuromuscular re-education, 97535- Self Care, [id number]- Manual therapy, [id number]- Gait training, [id number]- Orthotic Initial, [id number]- Orthotic/Prosthetic subsequent, [id number]- Canalith repositioning, [id number]- Aquatic Therapy, O7564- Electrical stimulation (unattended), and DME instructions  PLAN FOR NEXT SESSION:  HEP review and progression, sent request to MD for OT/ST    1:51 PM, 03/16/24 M. Kelly Halpin, PT, DPT Physical Therapist- [place] Office Number: [phone]

## 2024-03-18 ENCOUNTER — Ambulatory Visit: Admitting: Physical Therapy

## 2024-03-22 ENCOUNTER — Ambulatory Visit

## 2024-03-25 ENCOUNTER — Ambulatory Visit

## 2024-03-29 ENCOUNTER — Ambulatory Visit

## 2024-04-04 ENCOUNTER — Other Ambulatory Visit: Payer: Self-pay | Admitting: *Deleted

## 2024-04-04 DIAGNOSIS — I739 Peripheral vascular disease, unspecified: Secondary | ICD-10-CM

## 2024-04-05 ENCOUNTER — Ambulatory Visit

## 2024-04-14 ENCOUNTER — Telehealth: Payer: Self-pay | Admitting: Family Medicine

## 2024-04-14 NOTE — Telephone Encounter (Signed)
 Called pt's wife back and let her know the NP Amy lomax didn't take pt off Plavix  and that Plavix  was discontinued on 01/08/2024 by Carry Clapper MD.  Gave wife the number and address to MD office  Theadore Finger, MD [address] [place], [place] [address] Phone: [phone] . Scheduled pt with Dr. Janett Medin on 06/08/2024 @  10:00am.

## 2024-04-14 NOTE — Telephone Encounter (Signed)
 Pt's wife called wanting to discuss with NP why the pt was taken off of the Plavix . Please advise.

## 2024-04-21 NOTE — Progress Notes (Deleted)
 HISTORY AND PHYSICAL     CC:  follow up. Requesting Provider:  Tanda Prentice DEL, MD  HPI: This is a 63 y.o. male who is here today for follow up for PAD and was seen by Dr. Eliza in 2020.  He did undergo angiogram on 07/29/2019 by Dr. Eliza. 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  He was lost to follow up and subsequently had a right brain stroke.    Pt was last seen 10/30/2022 and at that time, he was not having any claudication but his mobility was limited by OA in the knee.  He was smoking 2-3 cigars a day.  He did not have rest pain or ulcers. Dr. Eliza discussed with pt that if he was going to proceed with knee replacement, a right to left fem fem bypass would need to be considered. He was to stay on asa/plavix /statin.  The pt returns today for follow up.  ***  The pt is on a statin for cholesterol management.    The pt is on an aspirin .    Other AC:  none The pt is on CCB, BB, ACEI for hypertension.  The pt is not on diabetic medication. Tobacco hx:  current  Pt does *** have family hx of AAA.  Past Medical History:  Diagnosis Date   Alcohol abuse 12/04/2016   Arthritis    Cocaine abuse (HCC) 12/02/2016   Hyperlipemia    Stroke [facility])     Past Surgical History:  Procedure Laterality Date   IR GENERIC HISTORICAL  12/02/2016   IR ANGIO VERTEBRAL SEL VERTEBRAL BILAT MOD SED 12/02/2016 Thyra Nash, MD [facility]   IR GENERIC HISTORICAL  12/02/2016   IR ANGIO INTRA EXTRACRAN SEL COM CAROTID INNOMINATE BILAT MOD SED 12/02/2016 Thyra Nash, MD [facility]   LOWER EXTREMITY ANGIOGRAPHY N/A 07/29/2019   Procedure: LOWER EXTREMITY ANGIOGRAPHY;  Surgeon: Eliza Lonni RAMAN, MD;  Location: [facility] INVASIVE CV LAB;  Service: Cardiovascular;  Laterality: N/A;    Allergies   Allergen Reactions   Penicillins Anaphylaxis    Has patient had a PCN reaction causing immediate rash, facial/tongue/throat swelling, SOB or lightheadedness with hypotension: Yes Has patient had a PCN reaction causing severe rash involving mucus membranes or skin necrosis: No Has patient had a PCN reaction that required hospitalization Yes Has patient had a PCN reaction occurring within the last 10 years: No If all of the above answers are NO, then may proceed with Cephalosporin use.     Current Outpatient Medications  Medication Sig Dispense Refill   acetaminophen  (TYLENOL ) 325 MG tablet Take 2 tablets (650 mg total) by mouth every 6 (six) hours as needed for mild pain (pain score 1-3) (or Fever >/= 101).     amLODipine  (NORVASC ) 10 MG tablet Take 1 tablet (10 mg total) by mouth daily.     aspirin  EC 81 MG tablet Take 1 tablet (81 mg total) by mouth daily. Swallow whole.     atorvastatin  (LIPITOR) 80 MG tablet Take 1 tablet (80 mg total) by mouth daily at 6 PM.     carvedilol  (COREG ) 6.25 MG tablet Take 1 tablet (6.25 mg total) by mouth 2 (two) times daily with a meal.     diclofenac  sodium (VOLTAREN ) 1 % GEL Apply 2-4 g topically  4 (four) times daily as needed (for hand or knee pain).     DULoxetine  (CYMBALTA ) 60 MG capsule Take 60 mg by mouth daily.     famotidine  (PEPCID ) 20 MG tablet Take 1 tablet (20 mg total) by mouth 2 (two) times daily. 30 tablet 0   gabapentin  (NEURONTIN ) 300 MG capsule Take 1 capsule (300 mg total) by mouth 3 (three) times daily. Take 300 mg by mouth in the morning, 300 mg in the afternoon, and 600 mg at bedtime     lisinopril  (ZESTRIL ) 30 MG tablet Take 1 tablet (30 mg total) by mouth daily.     Multiple Vitamin (MULTIVITAMIN WITH MINERALS) TABS tablet Take 1 tablet by mouth daily.     No current facility-administered medications for this visit.    Family History  Problem Relation Age of Onset   Heart attack Father    Stroke Paternal Grandfather      Social History   Socioeconomic History   Marital status: Married    Spouse name: Not on file   Number of children: Not on file   Years of education: Not on file   Highest education level: Not on file  Occupational History   Not on file  Tobacco Use   Smoking status: Every Day    Current packs/day: 0.25    Types: Cigars, Cigarettes   Smokeless tobacco: Never   Tobacco comments:    black mild 2 and three day smoke 4 per day  Vaping Use   Vaping status: Never Used  Substance and Sexual Activity   Alcohol use: Yes    Alcohol/week: 1.0 standard drink of alcohol    Types: 1 Cans of beer per week    Comment:  1 to  2 beers  weekly   Drug use: Not Currently    Types: Codeine, Cocaine    Comment: last use January 2019   Sexual activity: Never  Other Topics Concern   Not on file  Social History Narrative   Not on file   Social Drivers of Health   Financial Resource Strain: Not on file  Food Insecurity: Low Risk  (01/28/2024)   Received from [facility]   Hunger Vital Sign    Within the past 12 months, you worried that your food would run out before you got money to buy more: Never true    Within the past 12 months, the food you bought just didn't last and you didn't have money to get more. : Never true  Transportation Needs: No Transportation Needs (01/28/2024)   Received from [employer]    In the past 12 months, has lack of reliable transportation kept you from medical appointments, meetings, work or from getting things needed for daily living? : No  Physical Activity: Not on file  Stress: Not on file  Social Connections: Not on file  Intimate Partner Violence: Not At Risk (12/22/2023)   Humiliation, Afraid, Rape, and Kick questionnaire    Fear of Current or Ex-Partner: No    Emotionally Abused: No    Physically Abused: No    Sexually Abused: No     REVIEW OF SYSTEMS:  *** [X]  denotes positive finding, [ ]  denotes negative finding Cardiac   Comments:  Chest pain or chest pressure:    Shortness of breath upon exertion:    Short of breath when lying flat:    Irregular heart rhythm:        Vascular    Pain in  calf, thigh, or hip brought on by ambulation:    Pain in feet at night that wakes you up from your sleep:     Blood clot in your veins:    Leg swelling:         Pulmonary    Oxygen at home:    Productive cough:     Wheezing:         Neurologic    Sudden weakness in arms or legs:     Sudden numbness in arms or legs:     Sudden onset of difficulty speaking or slurred speech:    Temporary loss of vision in one eye:     Problems with dizziness:         Gastrointestinal    Blood in stool:     Vomited blood:         Genitourinary    Burning when urinating:     Blood in urine:        Psychiatric    Major depression:         Hematologic    Bleeding problems:    Problems with blood clotting too easily:        Skin    Rashes or ulcers:        Constitutional    Fever or chills:      PHYSICAL EXAMINATION:  ***  General:  WDWN in NAD; vital signs documented above Gait: Not observed HENT: WNL, normocephalic Pulmonary: normal non-labored breathing , without wheezing Cardiac: {Desc; regular/irreg:14544} HR, {With/Without:20273} carotid bruit*** Abdomen: soft, NT; aortic pulse is *** palpable Skin: {With/Without:20273} rashes Vascular Exam/Pulses:  Right Left  Radial {Exam; arterial pulse strength 0-4:30167} {Exam; arterial pulse strength 0-4:30167}  Femoral {Exam; arterial pulse strength 0-4:30167} {Exam; arterial pulse strength 0-4:30167}  Popliteal {Exam; arterial pulse strength 0-4:30167} {Exam; arterial pulse strength 0-4:30167}  DP {Exam; arterial pulse strength 0-4:30167} {Exam; arterial pulse strength 0-4:30167}  PT {Exam; arterial pulse strength 0-4:30167} {Exam; arterial pulse strength 0-4:30167}  Peroneal *** ***   Extremities: {With/Without:20273} ischemic changes, {With/Without:20273}  Gangrene , {With/Without:20273} cellulitis; {With/Without:20273} open wounds Musculoskeletal: no muscle wasting or atrophy  Neurologic: A&O X 3 Psychiatric:  The pt has {Desc; normal/abnormal:11317::Normal} affect.   Non-Invasive Vascular Imaging:   ABI's/TBI's on 04/21/2024: Right:  *** - Great toe pressure: *** Left:  *** - Great toe pressure: ***   Previous ABI's/TBI's on 10/30/2022: Right:  1.01/0.83 - Great toe pressure: 167 Left:  0.73/0.85 - Great toe pressure:  172  Previous AAA arterial duplex on 10/30/2022: Abdominal Aorta Findings:  +-----------+-------+----------+----------+--------+--------+--------+  Location  AP (cm)Trans (cm)PSV (cm/s)WaveformThrombusComments  +-----------+-------+----------+----------+--------+--------+--------+  Proximal         2.85      79                                  +-----------+-------+----------+----------+--------+--------+--------+  Mid       1.80   1.90      75                                  +-----------+-------+----------+----------+--------+--------+--------+  Distal    1.90   1.73      50                                  +-----------+-------+----------+----------+--------+--------+--------+  LT CIA Prox1.1    1.1       69                                  +-----------+-------+----------+----------+--------+--------+--------+   Visualization of the Proximal Abdominal Aorta and Right CIA Proximal  artery was limited.   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 The largest aortic measurement is 2.8 cm.     ASSESSMENT/PLAN:: 63 y.o. male here for follow up for PAD and was seen by Dr. Eliza in 2020.  Pt has hx of known left EIA occlusion and had previously considered a right to left fem fem bypass.  He was lost to follow up and subsequently had a right brain stroke   -*** -continue asa/plavix /stain -discussed importance of  increased walking daily -pt will f/u in *** with ***.   Lucie Apt, [facility] [phone]  Clinic MD:   ***

## 2024-04-22 ENCOUNTER — Ambulatory Visit

## 2024-04-22 ENCOUNTER — Ambulatory Visit (HOSPITAL_COMMUNITY): Admission: RE | Admit: 2024-04-22 | Source: Ambulatory Visit

## 2024-04-28 ENCOUNTER — Ambulatory Visit

## 2024-04-28 ENCOUNTER — Ambulatory Visit: Admitting: Occupational Therapy

## 2024-05-13 ENCOUNTER — Encounter (HOSPITAL_COMMUNITY)

## 2024-05-13 ENCOUNTER — Ambulatory Visit

## 2024-06-08 ENCOUNTER — Ambulatory Visit: Admitting: Neurology

## 2024-07-29 ENCOUNTER — Ambulatory Visit

## 2024-07-29 ENCOUNTER — Ambulatory Visit (HOSPITAL_COMMUNITY)
Admission: RE | Admit: 2024-07-29 | Discharge: 2024-07-29 | Disposition: A | Discharge status: Home / Self Care | Source: Ambulatory Visit | Attending: Physician Assistant | Admitting: Physician Assistant

## 2024-07-29 VITALS — BP 105/74 | HR 96 | Temp 97.4°F | Resp 18 | Ht 70.0 in | Wt 158.0 lb

## 2024-07-29 DIAGNOSIS — I70213 Atherosclerosis of native arteries of extremities with intermittent claudication, bilateral legs: Secondary | ICD-10-CM | POA: Insufficient documentation

## 2024-07-29 DIAGNOSIS — F17200 Nicotine dependence, unspecified, uncomplicated: Secondary | ICD-10-CM | POA: Insufficient documentation

## 2024-07-29 DIAGNOSIS — I739 Peripheral vascular disease, unspecified: Secondary | ICD-10-CM | POA: Insufficient documentation

## 2024-07-29 LAB — VAS US ABI WITH/WO TBI
Left ABI: 0.68
Right ABI: 0.71

## 2024-07-29 NOTE — Progress Notes (Signed)
 Office Note     CC:  follow up Requesting Provider:  Tanda Prentice DEL, MD  HPI: Hector Booker is a 63 y.o. ([birth year]) male who presents surveillance of PAD.  He has a known left external iliac artery occlusion.  He has claudication symptoms of both calves however admittedly does not do much walking.  He walks with a walker at home.  He uses a wheelchair for longer distances.  He has also suffered from 3 strokes with most recent stroke being this past December.  Fortunately he is without rest pain or tissue loss of bilateral lower extremities.  He continues to smoke about a pack a day.  He takes an aspirin , Plavix , statin daily.  It should also be noted that previously he was being considered for right to left femorofemoral bypass in order to undergo left knee replacement.   Past Medical History:  Diagnosis Date   Alcohol abuse 12/04/2016   Arthritis    Cocaine abuse (HCC) 12/02/2016   Hyperlipemia    Peripheral vascular disease    Stroke [facility])     Past Surgical History:  Procedure Laterality Date   IR GENERIC HISTORICAL  12/02/2016   IR ANGIO VERTEBRAL SEL VERTEBRAL BILAT MOD SED 12/02/2016 Thyra Nash, MD [facility]   IR GENERIC HISTORICAL  12/02/2016   IR ANGIO INTRA EXTRACRAN SEL COM CAROTID INNOMINATE BILAT MOD SED 12/02/2016 Thyra Nash, MD [facility]   LOWER EXTREMITY ANGIOGRAPHY N/A 07/29/2019   Procedure: LOWER EXTREMITY ANGIOGRAPHY;  Surgeon: Eliza Lonni RAMAN, MD;  Location: [facility] INVASIVE CV LAB;  Service: Cardiovascular;  Laterality: N/A;    Social History   Socioeconomic History   Marital status: Married    Spouse name: Not on file   Number of children: Not on file   Years of education: Not on file   Highest education level: Not on file  Occupational History   Not on file  Tobacco Use   Smoking status: Every Day    Current packs/day: 0.25    Types: Cigars, Cigarettes   Smokeless tobacco: Never   Tobacco comments:    black mild 2 and three day  smoke 4 per day  Vaping Use   Vaping status: Never Used  Substance and Sexual Activity   Alcohol use: Yes    Alcohol/week: 1.0 standard drink of alcohol    Types: 1 Cans of beer per week    Comment:  1 to  2 beers  weekly   Drug use: Not Currently    Types: Codeine, Cocaine    Comment: last use January 2019   Sexual activity: Never  Other Topics Concern   Not on file  Social History Narrative   Not on file   Social Drivers of Health   Financial Resource Strain: Not on file  Food Insecurity: Low Risk  (01/28/2024)   Received from [facility]   Hunger Vital Sign    Within the past 12 months, you worried that your food would run out before you got money to buy more: Never true    Within the past 12 months, the food you bought just didn't last and you didn't have money to get more. : Never true  Transportation Needs: No Transportation Needs (01/28/2024)   Received from [employer]    In the past 12 months, has lack of reliable transportation kept you from medical appointments, meetings, work or from getting things needed for daily living? : No  Physical Activity: Not on  file  Stress: Not on file  Social Connections: Not on file  Intimate Partner Violence: Not At Risk (12/22/2023)   Humiliation, Afraid, Rape, and Kick questionnaire    Fear of Current or Ex-Partner: No    Emotionally Abused: No    Physically Abused: No    Sexually Abused: No    Family History  Problem Relation Age of Onset   Heart attack Father    Stroke Paternal Grandfather     Current Outpatient Medications  Medication Sig Dispense Refill   acetaminophen  (TYLENOL ) 325 MG tablet Take 2 tablets (650 mg total) by mouth every 6 (six) hours as needed for mild pain (pain score 1-3) (or Fever >/= 101).     amLODipine  (NORVASC ) 10 MG tablet Take 1 tablet (10 mg total) by mouth daily.     atorvastatin  (LIPITOR) 80 MG tablet Take 1 tablet (80 mg total) by mouth daily at 6 PM.     clopidogrel   (PLAVIX ) 75 MG tablet Take 75 mg by mouth daily.     diclofenac  sodium (VOLTAREN ) 1 % GEL Apply 2-4 g topically 4 (four) times daily as needed (for hand or knee pain).     DULoxetine  (CYMBALTA ) 60 MG capsule Take 60 mg by mouth daily.     famotidine  (PEPCID ) 20 MG tablet Take 1 tablet (20 mg total) by mouth 2 (two) times daily. 30 tablet 0   gabapentin  (NEURONTIN ) 300 MG capsule Take 1 capsule (300 mg total) by mouth 3 (three) times daily. Take 300 mg by mouth in the morning, 300 mg in the afternoon, and 600 mg at bedtime     hydrochlorothiazide (HYDRODIURIL) 25 MG tablet Take 25 mg by mouth daily.     HYDROcodone -acetaminophen  (NORCO) 10-325 MG tablet Take 1 tablet by mouth.     lisinopril  (ZESTRIL ) 30 MG tablet Take 1 tablet (30 mg total) by mouth daily.     Multiple Vitamin (MULTIVITAMIN WITH MINERALS) TABS tablet Take 1 tablet by mouth daily.     potassium chloride  SA (KLOR-CON  M) 20 MEQ tablet Take 20 mEq by mouth 2 (two) times daily.     aspirin  EC 81 MG tablet Take 1 tablet (81 mg total) by mouth daily. Swallow whole. (Patient not taking: Reported on 07/29/2024)     carvedilol  (COREG ) 6.25 MG tablet Take 1 tablet (6.25 mg total) by mouth 2 (two) times daily with a meal. (Patient not taking: Reported on 07/29/2024)     No current facility-administered medications for this visit.    Allergies  Allergen Reactions   Penicillins Anaphylaxis    Has patient had a PCN reaction causing immediate rash, facial/tongue/throat swelling, SOB or lightheadedness with hypotension: Yes Has patient had a PCN reaction causing severe rash involving mucus membranes or skin necrosis: No Has patient had a PCN reaction that required hospitalization Yes Has patient had a PCN reaction occurring within the last 10 years: No If all of the above answers are NO, then may proceed with Cephalosporin use.      REVIEW OF SYSTEMS:  Negative unless noted in HPI [X]  denotes positive finding, [ ]  denotes negative  finding Cardiac  Comments:  Chest pain or chest pressure:    Shortness of breath upon exertion:    Short of breath when lying flat:    Irregular heart rhythm:        Vascular    Pain in calf, thigh, or hip brought on by ambulation:    Pain in feet at night that wakes you  up from your sleep:     Blood clot in your veins:    Leg swelling:         Pulmonary    Oxygen at home:    Productive cough:     Wheezing:         Neurologic    Sudden weakness in arms or legs:     Sudden numbness in arms or legs:     Sudden onset of difficulty speaking or slurred speech:    Temporary loss of vision in one eye:     Problems with dizziness:         Gastrointestinal    Blood in stool:     Vomited blood:         Genitourinary    Burning when urinating:     Blood in urine:        Psychiatric    Major depression:         Hematologic    Bleeding problems:    Problems with blood clotting too easily:        Skin    Rashes or ulcers:        Constitutional    Fever or chills:      PHYSICAL EXAMINATION:  Vitals:   07/29/24 1323  BP: 105/74  Pulse: 96  Resp: 18  Temp: (!) 97.4 F (36.3 C)  TempSrc: Temporal  SpO2: 98%  Weight: 158 lb (71.7 kg)  Height: 5' 10 (1.778 m)    General:  WDWN in NAD; vital signs documented above Gait: Not observed HENT: WNL, normocephalic Pulmonary: normal non-labored breathing Cardiac: regular HR Abdomen: soft, NT, no masses Skin: without rashes Vascular Exam/Pulses: absent pedal pulses Extremities: without ischemic changes, without Gangrene , without cellulitis; without open wounds;  Musculoskeletal: no muscle wasting or atrophy  Neurologic: A&O X 3 Psychiatric:  The pt has Normal affect.   Non-Invasive Vascular Imaging:    ABI/TBIToday's ABIToday's TBIPrevious ABIPrevious TBI  +-------+-----------+-----------+------------+------------+  Right 0.71       0.31       1.01        0.83           +-------+-----------+-----------+------------+------------+  Left  0.68       0.49       0.73        0.85          +-------+-----------+-----------+------------+------------+     ASSESSMENT/PLAN:: 63 y.o. male here for follow up for surveillance of PAD  Mr. Zappone is a 63 year old male with known PAD.  He has a known left external iliac artery occlusion.  He reports claudication in both legs however has limited mobility at baseline.  He uses a wheelchair for long distances and walks with a walker at home.  On exam he is without tissue loss.  He is also without rest pain.  He has an ABI of 0.7 bilaterally.  No indication for further workup or intervention at this time given that he is without rest pain or tissue loss.  He again discussed his consideration of a left knee replacement.  Dr. Eliza discussed with the patient that he may require a right to left femorofemoral bypass prior to proceeding with left knee replacement.  For now his symptoms are tolerable.  He plans to continue to get cortisone injections into the left knee.  We will repeat ABIs in 1 year.  He will notify us  in the meantime if he develops any rest pain or tissue loss.  Hector Sender, PA-C [facility] [phone]  Clinic MD:   G[place]ta on call
